# Patient Record
Sex: Male | Born: 1950 | Race: Black or African American | Hispanic: No | Smoking: Current every day smoker
Health system: Southern US, Community
[De-identification: ages and names within clinical notes are randomized; demographics above are authoritative.]

## PROBLEM LIST (undated history)

## (undated) DIAGNOSIS — I10 Essential (primary) hypertension: Secondary | ICD-10-CM

## (undated) DIAGNOSIS — R200 Anesthesia of skin: Secondary | ICD-10-CM

## (undated) DIAGNOSIS — M1990 Unspecified osteoarthritis, unspecified site: Secondary | ICD-10-CM

## (undated) DIAGNOSIS — C189 Malignant neoplasm of colon, unspecified: Secondary | ICD-10-CM

## (undated) DIAGNOSIS — J439 Emphysema, unspecified: Secondary | ICD-10-CM

## (undated) HISTORY — PX: COLONOSCOPY: SHX5424

---

## 2001-03-31 HISTORY — PX: AMPUTATION: SHX166

## 2010-06-18 ENCOUNTER — Other Ambulatory Visit: Payer: Self-pay | Admitting: Podiatry

## 2010-06-25 ENCOUNTER — Ambulatory Visit: Payer: Self-pay | Admitting: Anesthesiology

## 2010-06-26 ENCOUNTER — Ambulatory Visit: Payer: Self-pay | Admitting: Podiatry

## 2011-04-01 HISTORY — PX: COLON SURGERY: SHX602

## 2012-08-31 ENCOUNTER — Ambulatory Visit: Payer: Self-pay

## 2012-10-13 ENCOUNTER — Ambulatory Visit: Payer: Self-pay | Admitting: Gastroenterology

## 2012-10-14 LAB — PATHOLOGY REPORT

## 2012-10-28 ENCOUNTER — Ambulatory Visit: Payer: Self-pay | Admitting: Surgery

## 2012-10-28 LAB — CBC WITH DIFFERENTIAL/PLATELET
Basophil #: 0.1 10*3/uL (ref 0.0–0.1)
Eosinophil #: 0.2 10*3/uL (ref 0.0–0.7)
Lymphocyte %: 21.8 %
MCHC: 34.3 g/dL (ref 32.0–36.0)
Neutrophil %: 66.9 %
Platelet: 274 10*3/uL (ref 150–440)
RBC: 4.51 10*6/uL (ref 4.40–5.90)
RDW: 14.1 % (ref 11.5–14.5)
WBC: 9.8 10*3/uL (ref 3.8–10.6)

## 2012-10-28 LAB — BASIC METABOLIC PANEL
Anion Gap: 3 — ABNORMAL LOW (ref 7–16)
Calcium, Total: 9.4 mg/dL (ref 8.5–10.1)
Chloride: 103 mmol/L (ref 98–107)
Co2: 30 mmol/L (ref 21–32)
EGFR (African American): >60
EGFR (Non-African Amer.): >60
Sodium: 136 mmol/L (ref 136–145)

## 2012-11-05 ENCOUNTER — Inpatient Hospital Stay: Payer: Self-pay | Admitting: Surgery

## 2012-11-06 LAB — CBC WITH DIFFERENTIAL/PLATELET
Basophil #: 0.1 10*3/uL (ref 0.0–0.1)
Basophil %: 0.3 %
Eosinophil %: 0.1 %
HCT: 38.5 % — ABNORMAL LOW (ref 40.0–52.0)
HGB: 13.1 g/dL (ref 13.0–18.0)
Lymphocyte #: 1.6 10*3/uL (ref 1.0–3.6)
Lymphocyte %: 8.9 %
MCH: 30.4 pg (ref 26.0–34.0)
MCV: 90 fL (ref 80–100)
Monocyte #: 1.2 x10 3/mm — ABNORMAL HIGH (ref 0.2–1.0)
Monocyte %: 6.7 %
Neutrophil #: 14.9 10*3/uL — ABNORMAL HIGH (ref 1.4–6.5)
RBC: 4.3 10*6/uL — ABNORMAL LOW (ref 4.40–5.90)
RDW: 14 % (ref 11.5–14.5)
WBC: 17.7 10*3/uL — ABNORMAL HIGH (ref 3.8–10.6)

## 2012-11-06 LAB — BASIC METABOLIC PANEL
Anion Gap: 7 (ref 7–16)
BUN: 9 mg/dL (ref 7–18)
Calcium, Total: 9.1 mg/dL (ref 8.5–10.1)
Chloride: 99 mmol/L (ref 98–107)
Creatinine: 0.99 mg/dL (ref 0.60–1.30)
EGFR (African American): >60
Osmolality: 265 (ref 275–301)
Potassium: 4.4 mmol/L (ref 3.5–5.1)
Sodium: 132 mmol/L — ABNORMAL LOW (ref 136–145)

## 2012-11-16 ENCOUNTER — Ambulatory Visit: Payer: Self-pay | Admitting: Oncology

## 2012-11-29 ENCOUNTER — Ambulatory Visit: Payer: Self-pay | Admitting: Oncology

## 2012-12-28 ENCOUNTER — Ambulatory Visit: Payer: Self-pay | Admitting: Surgery

## 2012-12-29 ENCOUNTER — Ambulatory Visit: Payer: Self-pay | Admitting: Oncology

## 2012-12-31 LAB — CBC CANCER CENTER
Basophil #: 0.1 x10 3/mm (ref 0.0–0.1)
Basophil %: 1.1 %
Eosinophil %: 1.1 %
HGB: 13.7 g/dL (ref 13.0–18.0)
Lymphocyte #: 2 x10 3/mm (ref 1.0–3.6)
MCHC: 32.6 g/dL (ref 32.0–36.0)
Monocyte %: 8.6 %
Neutrophil %: 63.1 %
Platelet: 200 x10 3/mm (ref 150–440)
RBC: 4.56 10*6/uL (ref 4.40–5.90)
WBC: 7.7 x10 3/mm (ref 3.8–10.6)

## 2012-12-31 LAB — COMPREHENSIVE METABOLIC PANEL
Albumin: 3.6 g/dL (ref 3.4–5.0)
Alkaline Phosphatase: 108 U/L (ref 50–136)
Anion Gap: 8 (ref 7–16)
BUN: 8 mg/dL (ref 7–18)
Bilirubin,Total: 0.3 mg/dL (ref 0.2–1.0)
Calcium, Total: 9.2 mg/dL (ref 8.5–10.1)
Chloride: 103 mmol/L (ref 98–107)
Co2: 27 mmol/L (ref 21–32)
Creatinine: 1.01 mg/dL (ref 0.60–1.30)
EGFR (African American): >60
EGFR (Non-African Amer.): >60
Glucose: 117 mg/dL — ABNORMAL HIGH (ref 65–99)
Osmolality: 275 (ref 275–301)
Potassium: 3.5 mmol/L (ref 3.5–5.1)
SGOT(AST): 13 U/L — ABNORMAL LOW (ref 15–37)
SGPT (ALT): 16 U/L (ref 12–78)
Sodium: 138 mmol/L (ref 136–145)
Total Protein: 7.8 g/dL (ref 6.4–8.2)

## 2013-01-14 LAB — CBC CANCER CENTER
Basophil %: 1.4 %
Eosinophil #: 0.1 x10 3/mm (ref 0.0–0.7)
Eosinophil %: 1 %
HCT: 43.1 % (ref 40.0–52.0)
Lymphocyte #: 2.4 x10 3/mm (ref 1.0–3.6)
MCH: 30.5 pg (ref 26.0–34.0)
MCV: 93 fL (ref 80–100)
Monocyte #: 0.9 x10 3/mm (ref 0.2–1.0)
Monocyte %: 11.1 %
Neutrophil #: 4.4 x10 3/mm (ref 1.4–6.5)
Neutrophil %: 56.2 %
Platelet: 168 x10 3/mm (ref 150–440)
RBC: 4.65 10*6/uL (ref 4.40–5.90)
RDW: 14.4 % (ref 11.5–14.5)

## 2013-01-14 LAB — COMPREHENSIVE METABOLIC PANEL
Albumin: 3.6 g/dL (ref 3.4–5.0)
Anion Gap: 6 — ABNORMAL LOW (ref 7–16)
BUN: 6 mg/dL — ABNORMAL LOW (ref 7–18)
Bilirubin,Total: 0.2 mg/dL (ref 0.2–1.0)
Chloride: 104 mmol/L (ref 98–107)
Co2: 29 mmol/L (ref 21–32)
EGFR (African American): >60
EGFR (Non-African Amer.): >60
Glucose: 85 mg/dL (ref 65–99)
Osmolality: 274 (ref 275–301)
SGOT(AST): 15 U/L (ref 15–37)
SGPT (ALT): 20 U/L (ref 12–78)
Sodium: 139 mmol/L (ref 136–145)
Total Protein: 7.6 g/dL (ref 6.4–8.2)

## 2013-01-29 ENCOUNTER — Ambulatory Visit: Payer: Self-pay | Admitting: Oncology

## 2013-02-04 LAB — CBC CANCER CENTER
Basophil #: 0.2 x10 3/mm — ABNORMAL HIGH (ref 0.0–0.1)
Eosinophil #: 0.1 x10 3/mm (ref 0.0–0.7)
HCT: 45.9 % (ref 40.0–52.0)
Lymphocyte #: 2.7 x10 3/mm (ref 1.0–3.6)
Lymphocyte %: 30.7 %
MCH: 30.8 pg (ref 26.0–34.0)
MCHC: 33 g/dL (ref 32.0–36.0)
MCV: 93 fL (ref 80–100)
Monocyte #: 1.3 x10 3/mm — ABNORMAL HIGH (ref 0.2–1.0)
Monocyte %: 14.7 %
Neutrophil #: 4.5 x10 3/mm (ref 1.4–6.5)
Neutrophil %: 51 %
Platelet: 192 x10 3/mm (ref 150–440)
RBC: 4.92 10*6/uL (ref 4.40–5.90)
RDW: 15.2 % — ABNORMAL HIGH (ref 11.5–14.5)

## 2013-02-11 LAB — COMPREHENSIVE METABOLIC PANEL
Alkaline Phosphatase: 118 U/L (ref 50–136)
Anion Gap: 9 (ref 7–16)
BUN: 14 mg/dL (ref 7–18)
Bilirubin,Total: 0.2 mg/dL (ref 0.2–1.0)
Calcium, Total: 9.1 mg/dL (ref 8.5–10.1)
Chloride: 103 mmol/L (ref 98–107)
Co2: 27 mmol/L (ref 21–32)
EGFR (African American): >60
EGFR (Non-African Amer.): >60
Osmolality: 280 (ref 275–301)
SGOT(AST): 12 U/L — ABNORMAL LOW (ref 15–37)
Sodium: 139 mmol/L (ref 136–145)
Total Protein: 7.8 g/dL (ref 6.4–8.2)

## 2013-02-11 LAB — CBC CANCER CENTER
Basophil #: 0.2 x10 3/mm — ABNORMAL HIGH (ref 0.0–0.1)
Basophil %: 1.5 %
Eosinophil %: 0.5 %
HCT: 45 % (ref 40.0–52.0)
HGB: 14.8 g/dL (ref 13.0–18.0)
MCH: 31 pg (ref 26.0–34.0)
MCHC: 32.9 g/dL (ref 32.0–36.0)
Monocyte %: 7.9 %
Neutrophil #: 8.7 x10 3/mm — ABNORMAL HIGH (ref 1.4–6.5)
Neutrophil %: 67.4 %
Platelet: 170 x10 3/mm (ref 150–440)
RDW: 15.1 % — ABNORMAL HIGH (ref 11.5–14.5)

## 2013-02-28 ENCOUNTER — Ambulatory Visit: Payer: Self-pay | Admitting: Oncology

## 2013-03-04 LAB — CBC CANCER CENTER
Basophil #: 0.2 x10 3/mm — ABNORMAL HIGH (ref 0.0–0.1)
Eosinophil #: 0 x10 3/mm (ref 0.0–0.7)
HGB: 14.6 g/dL (ref 13.0–18.0)
Lymphocyte #: 2 x10 3/mm (ref 1.0–3.6)
Lymphocyte %: 37.9 %
MCH: 30.8 pg (ref 26.0–34.0)
MCV: 93 fL (ref 80–100)
Platelet: 153 x10 3/mm (ref 150–440)
WBC: 5.2 x10 3/mm (ref 3.8–10.6)

## 2013-03-04 LAB — COMPREHENSIVE METABOLIC PANEL
Albumin: 3.4 g/dL (ref 3.4–5.0)
Alkaline Phosphatase: 107 U/L
Anion Gap: 7 (ref 7–16)
Bilirubin,Total: 0.4 mg/dL (ref 0.2–1.0)
Calcium, Total: 8.8 mg/dL (ref 8.5–10.1)
Co2: 28 mmol/L (ref 21–32)
Creatinine: 0.88 mg/dL (ref 0.60–1.30)
EGFR (African American): >60
EGFR (Non-African Amer.): >60
Glucose: 106 mg/dL — ABNORMAL HIGH (ref 65–99)
Osmolality: 277 (ref 275–301)
Potassium: 3.2 mmol/L — ABNORMAL LOW (ref 3.5–5.1)
SGPT (ALT): 27 U/L (ref 12–78)
Sodium: 139 mmol/L (ref 136–145)

## 2013-03-11 LAB — CBC CANCER CENTER
Basophil #: 0.1 x10 3/mm (ref 0.0–0.1)
HGB: 15.1 g/dL (ref 13.0–18.0)
Lymphocyte #: 2.5 x10 3/mm (ref 1.0–3.6)
Lymphocyte %: 41 %
MCH: 31.3 pg (ref 26.0–34.0)
MCHC: 33.8 g/dL (ref 32.0–36.0)
MCV: 93 fL (ref 80–100)
Platelet: 126 x10 3/mm — ABNORMAL LOW (ref 150–440)
RBC: 4.81 10*6/uL (ref 4.40–5.90)
RDW: 15.4 % — ABNORMAL HIGH (ref 11.5–14.5)

## 2013-03-18 LAB — COMPREHENSIVE METABOLIC PANEL
Albumin: 3.5 g/dL (ref 3.4–5.0)
Anion Gap: 9 (ref 7–16)
BUN: 7 mg/dL (ref 7–18)
Calcium, Total: 8.8 mg/dL (ref 8.5–10.1)
Co2: 28 mmol/L (ref 21–32)
Creatinine: 1.01 mg/dL (ref 0.60–1.30)
EGFR (Non-African Amer.): >60
Osmolality: 277 (ref 275–301)
Potassium: 3.4 mmol/L — ABNORMAL LOW (ref 3.5–5.1)
SGOT(AST): 20 U/L (ref 15–37)
SGPT (ALT): 20 U/L (ref 12–78)
Sodium: 139 mmol/L (ref 136–145)
Total Protein: 7.6 g/dL (ref 6.4–8.2)

## 2013-03-18 LAB — CBC CANCER CENTER
Basophil %: 0.9 %
Eosinophil %: 0.7 %
HCT: 44.4 % (ref 40.0–52.0)
HGB: 14.8 g/dL (ref 13.0–18.0)
MCH: 31.1 pg (ref 26.0–34.0)
Monocyte %: 10.1 %
Neutrophil #: 2.4 x10 3/mm (ref 1.4–6.5)
Neutrophil %: 50.8 %
RBC: 4.75 10*6/uL (ref 4.40–5.90)
RDW: 15.9 % — ABNORMAL HIGH (ref 11.5–14.5)
WBC: 4.8 x10 3/mm (ref 3.8–10.6)

## 2013-03-18 LAB — MAGNESIUM: Magnesium: 1.9 mg/dL

## 2013-03-31 ENCOUNTER — Ambulatory Visit: Payer: Self-pay | Admitting: Oncology

## 2013-04-01 LAB — CBC CANCER CENTER
BASOS ABS: 0.1 x10 3/mm (ref 0.0–0.1)
Basophil %: 1.1 %
Eosinophil #: 0 x10 3/mm (ref 0.0–0.7)
Eosinophil %: 0.3 %
HCT: 44.8 % (ref 40.0–52.0)
HGB: 14.9 g/dL (ref 13.0–18.0)
Lymphocyte #: 2.4 x10 3/mm (ref 1.0–3.6)
Lymphocyte %: 34.6 %
MCH: 31.2 pg (ref 26.0–34.0)
MCHC: 33.3 g/dL (ref 32.0–36.0)
MCV: 94 fL (ref 80–100)
MONO ABS: 0.7 x10 3/mm (ref 0.2–1.0)
MONOS PCT: 10.6 %
NEUTROS PCT: 53.4 %
Neutrophil #: 3.6 x10 3/mm (ref 1.4–6.5)
Platelet: 73 x10 3/mm — ABNORMAL LOW (ref 150–440)
RBC: 4.78 10*6/uL (ref 4.40–5.90)
RDW: 16.1 % — AB (ref 11.5–14.5)
WBC: 6.8 x10 3/mm (ref 3.8–10.6)

## 2013-04-01 LAB — COMPREHENSIVE METABOLIC PANEL
ALBUMIN: 3.4 g/dL (ref 3.4–5.0)
AST: 41 U/L — AB (ref 15–37)
Alkaline Phosphatase: 112 U/L
Anion Gap: 10 (ref 7–16)
BILIRUBIN TOTAL: 0.4 mg/dL (ref 0.2–1.0)
BUN: 14 mg/dL (ref 7–18)
CALCIUM: 9.2 mg/dL (ref 8.5–10.1)
CHLORIDE: 103 mmol/L (ref 98–107)
CREATININE: 1.1 mg/dL (ref 0.60–1.30)
Co2: 26 mmol/L (ref 21–32)
Glucose: 153 mg/dL — ABNORMAL HIGH (ref 65–99)
Osmolality: 281 (ref 275–301)
Potassium: 3.3 mmol/L — ABNORMAL LOW (ref 3.5–5.1)
SGPT (ALT): 46 U/L (ref 12–78)
Sodium: 139 mmol/L (ref 136–145)
Total Protein: 7.4 g/dL (ref 6.4–8.2)

## 2013-04-15 LAB — COMPREHENSIVE METABOLIC PANEL
ALBUMIN: 3.5 g/dL (ref 3.4–5.0)
ALK PHOS: 114 U/L
ANION GAP: 11 (ref 7–16)
BUN: 12 mg/dL (ref 7–18)
Bilirubin,Total: 0.4 mg/dL (ref 0.2–1.0)
Calcium, Total: 9.3 mg/dL (ref 8.5–10.1)
Chloride: 103 mmol/L (ref 98–107)
Co2: 27 mmol/L (ref 21–32)
Creatinine: 1.04 mg/dL (ref 0.60–1.30)
Glucose: 156 mg/dL — ABNORMAL HIGH (ref 65–99)
Osmolality: 284 (ref 275–301)
POTASSIUM: 3.1 mmol/L — AB (ref 3.5–5.1)
SGOT(AST): 32 U/L (ref 15–37)
SGPT (ALT): 38 U/L (ref 12–78)
Sodium: 141 mmol/L (ref 136–145)
Total Protein: 7.7 g/dL (ref 6.4–8.2)

## 2013-04-15 LAB — CBC CANCER CENTER
Basophil #: 0.1 x10 3/mm (ref 0.0–0.1)
Basophil %: 1 %
EOS PCT: 0.3 %
Eosinophil #: 0 x10 3/mm (ref 0.0–0.7)
HCT: 44 % (ref 40.0–52.0)
HGB: 14.7 g/dL (ref 13.0–18.0)
LYMPHS ABS: 2.1 x10 3/mm (ref 1.0–3.6)
LYMPHS PCT: 38.8 %
MCH: 31.6 pg (ref 26.0–34.0)
MCHC: 33.3 g/dL (ref 32.0–36.0)
MCV: 95 fL (ref 80–100)
MONO ABS: 0.6 x10 3/mm (ref 0.2–1.0)
Monocyte %: 11.7 %
Neutrophil #: 2.7 x10 3/mm (ref 1.4–6.5)
Neutrophil %: 48.2 %
Platelet: 72 x10 3/mm — ABNORMAL LOW (ref 150–440)
RBC: 4.63 10*6/uL (ref 4.40–5.90)
RDW: 16.6 % — AB (ref 11.5–14.5)
WBC: 5.5 x10 3/mm (ref 3.8–10.6)

## 2013-05-01 ENCOUNTER — Ambulatory Visit: Payer: Self-pay | Admitting: Oncology

## 2013-05-06 LAB — COMPREHENSIVE METABOLIC PANEL
ALK PHOS: 127 U/L — AB
AST: 28 U/L (ref 15–37)
Albumin: 3.5 g/dL (ref 3.4–5.0)
Anion Gap: 10 (ref 7–16)
BUN: 9 mg/dL (ref 7–18)
Bilirubin,Total: 0.4 mg/dL (ref 0.2–1.0)
Calcium, Total: 9 mg/dL (ref 8.5–10.1)
Chloride: 102 mmol/L (ref 98–107)
Co2: 28 mmol/L (ref 21–32)
Creatinine: 0.93 mg/dL (ref 0.60–1.30)
GLUCOSE: 171 mg/dL — AB (ref 65–99)
Osmolality: 282 (ref 275–301)
POTASSIUM: 2.9 mmol/L — AB (ref 3.5–5.1)
SGPT (ALT): 33 U/L (ref 12–78)
Sodium: 140 mmol/L (ref 136–145)
Total Protein: 7.6 g/dL (ref 6.4–8.2)

## 2013-05-06 LAB — CBC CANCER CENTER
BASOS ABS: 0.1 x10 3/mm (ref 0.0–0.1)
Basophil %: 1.4 %
EOS PCT: 0.6 %
Eosinophil #: 0 x10 3/mm (ref 0.0–0.7)
HCT: 46.3 % (ref 40.0–52.0)
HGB: 15.2 g/dL (ref 13.0–18.0)
Lymphocyte #: 2.4 x10 3/mm (ref 1.0–3.6)
Lymphocyte %: 48.7 %
MCH: 31.9 pg (ref 26.0–34.0)
MCHC: 32.7 g/dL (ref 32.0–36.0)
MCV: 98 fL (ref 80–100)
MONOS PCT: 20.1 %
Monocyte #: 1 x10 3/mm (ref 0.2–1.0)
NEUTROS ABS: 1.4 x10 3/mm (ref 1.4–6.5)
NEUTROS PCT: 29.2 %
Platelet: 83 x10 3/mm — ABNORMAL LOW (ref 150–440)
RBC: 4.75 10*6/uL (ref 4.40–5.90)
RDW: 16.8 % — ABNORMAL HIGH (ref 11.5–14.5)
WBC: 4.9 x10 3/mm (ref 3.8–10.6)

## 2013-05-20 LAB — HEPATIC FUNCTION PANEL A (ARMC)
ALBUMIN: 3.6 g/dL (ref 3.4–5.0)
Alkaline Phosphatase: 123 U/L — ABNORMAL HIGH
Bilirubin, Direct: 0.1 mg/dL (ref 0.00–0.20)
Bilirubin,Total: 0.4 mg/dL (ref 0.2–1.0)
SGOT(AST): 31 U/L (ref 15–37)
SGPT (ALT): 30 U/L (ref 12–78)
Total Protein: 8.3 g/dL — ABNORMAL HIGH (ref 6.4–8.2)

## 2013-05-27 LAB — CBC CANCER CENTER
Basophil #: 0.1 x10 3/mm (ref 0.0–0.1)
Basophil %: 1.3 %
EOS PCT: 0.5 %
Eosinophil #: 0 x10 3/mm (ref 0.0–0.7)
HCT: 43.4 % (ref 40.0–52.0)
HGB: 14.2 g/dL (ref 13.0–18.0)
Lymphocyte #: 2.9 x10 3/mm (ref 1.0–3.6)
Lymphocyte %: 32.9 %
MCH: 31.9 pg (ref 26.0–34.0)
MCHC: 32.8 g/dL (ref 32.0–36.0)
MCV: 97 fL (ref 80–100)
MONO ABS: 1.3 x10 3/mm — AB (ref 0.2–1.0)
Monocyte %: 14.6 %
Neutrophil #: 4.4 x10 3/mm (ref 1.4–6.5)
Neutrophil %: 50.7 %
PLATELETS: 171 x10 3/mm (ref 150–440)
RBC: 4.47 10*6/uL (ref 4.40–5.90)
RDW: 16 % — AB (ref 11.5–14.5)
WBC: 8.8 x10 3/mm (ref 3.8–10.6)

## 2013-05-27 LAB — COMPREHENSIVE METABOLIC PANEL
ALBUMIN: 3.2 g/dL — AB (ref 3.4–5.0)
ALK PHOS: 143 U/L — AB
ANION GAP: 10 (ref 7–16)
BUN: 9 mg/dL (ref 7–18)
Bilirubin,Total: 0.3 mg/dL (ref 0.2–1.0)
CALCIUM: 8.8 mg/dL (ref 8.5–10.1)
CHLORIDE: 102 mmol/L (ref 98–107)
CO2: 27 mmol/L (ref 21–32)
CREATININE: 1.13 mg/dL (ref 0.60–1.30)
EGFR (Non-African Amer.): >60
Glucose: 174 mg/dL — ABNORMAL HIGH (ref 65–99)
Osmolality: 280 (ref 275–301)
Potassium: 3.2 mmol/L — ABNORMAL LOW (ref 3.5–5.1)
SGOT(AST): 26 U/L (ref 15–37)
SGPT (ALT): 24 U/L (ref 12–78)
SODIUM: 139 mmol/L (ref 136–145)
Total Protein: 8 g/dL (ref 6.4–8.2)

## 2013-05-29 ENCOUNTER — Ambulatory Visit: Payer: Self-pay | Admitting: Oncology

## 2013-06-17 LAB — COMPREHENSIVE METABOLIC PANEL
Albumin: 3.3 g/dL — ABNORMAL LOW (ref 3.4–5.0)
Alkaline Phosphatase: 139 U/L — ABNORMAL HIGH
Anion Gap: 8 (ref 7–16)
BUN: 9 mg/dL (ref 7–18)
Bilirubin,Total: 0.4 mg/dL (ref 0.2–1.0)
CHLORIDE: 102 mmol/L (ref 98–107)
CO2: 28 mmol/L (ref 21–32)
CREATININE: 1.11 mg/dL (ref 0.60–1.30)
Calcium, Total: 8.9 mg/dL (ref 8.5–10.1)
EGFR (African American): >60
EGFR (Non-African Amer.): >60
Glucose: 244 mg/dL — ABNORMAL HIGH (ref 65–99)
OSMOLALITY: 282 (ref 275–301)
POTASSIUM: 2.9 mmol/L — AB (ref 3.5–5.1)
SGOT(AST): 27 U/L (ref 15–37)
SGPT (ALT): 26 U/L (ref 12–78)
SODIUM: 138 mmol/L (ref 136–145)
TOTAL PROTEIN: 7.6 g/dL (ref 6.4–8.2)

## 2013-06-17 LAB — CBC CANCER CENTER
BASOS PCT: 1.5 %
Basophil #: 0.1 x10 3/mm (ref 0.0–0.1)
EOS ABS: 0 x10 3/mm (ref 0.0–0.7)
Eosinophil %: 1 %
HCT: 44.7 % (ref 40.0–52.0)
HGB: 14.6 g/dL (ref 13.0–18.0)
LYMPHS ABS: 2.4 x10 3/mm (ref 1.0–3.6)
Lymphocyte %: 51.5 %
MCH: 31.7 pg (ref 26.0–34.0)
MCHC: 32.6 g/dL (ref 32.0–36.0)
MCV: 97 fL (ref 80–100)
MONO ABS: 0.9 x10 3/mm (ref 0.2–1.0)
Monocyte %: 19.1 %
NEUTROS ABS: 1.3 x10 3/mm — AB (ref 1.4–6.5)
Neutrophil %: 26.9 %
Platelet: 140 x10 3/mm — ABNORMAL LOW (ref 150–440)
RBC: 4.59 10*6/uL (ref 4.40–5.90)
RDW: 15.6 % — AB (ref 11.5–14.5)
WBC: 4.7 x10 3/mm (ref 3.8–10.6)

## 2013-06-20 LAB — CEA: CEA: 13.4 ng/mL — ABNORMAL HIGH (ref 0.0–4.7)

## 2013-06-24 LAB — COMPREHENSIVE METABOLIC PANEL
ALBUMIN: 4.1 g/dL (ref 3.4–5.0)
ALK PHOS: 146 U/L — AB
ALT: 48 U/L (ref 12–78)
Anion Gap: 11 (ref 7–16)
BUN: 40 mg/dL — ABNORMAL HIGH (ref 7–18)
Bilirubin,Total: 0.8 mg/dL (ref 0.2–1.0)
CHLORIDE: 94 mmol/L — AB (ref 98–107)
Calcium, Total: 9 mg/dL (ref 8.5–10.1)
Co2: 30 mmol/L (ref 21–32)
Creatinine: 1.58 mg/dL — ABNORMAL HIGH (ref 0.60–1.30)
GFR CALC AF AMER: 54 — AB
GFR CALC NON AF AMER: 47 — AB
Glucose: 156 mg/dL — ABNORMAL HIGH (ref 65–99)
Osmolality: 283 (ref 275–301)
Potassium: 2.9 mmol/L — ABNORMAL LOW (ref 3.5–5.1)
SGOT(AST): 43 U/L — ABNORMAL HIGH (ref 15–37)
SODIUM: 135 mmol/L — AB (ref 136–145)
TOTAL PROTEIN: 8.9 g/dL — AB (ref 6.4–8.2)

## 2013-06-24 LAB — AMYLASE: AMYLASE: 113 U/L (ref 25–115)

## 2013-06-24 LAB — CBC CANCER CENTER
BASOS ABS: 0.1 x10 3/mm (ref 0.0–0.1)
BASOS PCT: 0.7 %
Eosinophil #: 0 x10 3/mm (ref 0.0–0.7)
Eosinophil %: 0.1 %
HCT: 48.9 % (ref 40.0–52.0)
HGB: 16.4 g/dL (ref 13.0–18.0)
LYMPHS ABS: 2.3 x10 3/mm (ref 1.0–3.6)
Lymphocyte %: 26.9 %
MCH: 32.3 pg (ref 26.0–34.0)
MCHC: 33.6 g/dL (ref 32.0–36.0)
MCV: 96 fL (ref 80–100)
Monocyte #: 0.4 x10 3/mm (ref 0.2–1.0)
Monocyte %: 4.2 %
Neutrophil #: 5.9 x10 3/mm (ref 1.4–6.5)
Neutrophil %: 68.1 %
Platelet: 95 x10 3/mm — ABNORMAL LOW (ref 150–440)
RBC: 5.07 10*6/uL (ref 4.40–5.90)
RDW: 14.9 % — ABNORMAL HIGH (ref 11.5–14.5)
WBC: 8.6 x10 3/mm (ref 3.8–10.6)

## 2013-06-24 LAB — LIPASE, BLOOD: Lipase: 196 U/L (ref 73–393)

## 2013-06-27 ENCOUNTER — Inpatient Hospital Stay: Payer: Self-pay | Admitting: Internal Medicine

## 2013-06-27 LAB — COMPREHENSIVE METABOLIC PANEL
ALBUMIN: 4.3 g/dL (ref 3.4–5.0)
ALK PHOS: 157 U/L — AB
ANION GAP: 17 — AB (ref 7–16)
AST: 85 U/L — AB (ref 15–37)
BILIRUBIN TOTAL: 1 mg/dL (ref 0.2–1.0)
BUN: 103 mg/dL — ABNORMAL HIGH (ref 7–18)
CO2: 29 mmol/L (ref 21–32)
CREATININE: 5.09 mg/dL — AB (ref 0.60–1.30)
Calcium, Total: 8.5 mg/dL (ref 8.5–10.1)
Chloride: 79 mmol/L — ABNORMAL LOW (ref 98–107)
EGFR (African American): 13 — ABNORMAL LOW
EGFR (Non-African Amer.): 11 — ABNORMAL LOW
Glucose: 164 mg/dL — ABNORMAL HIGH (ref 65–99)
OSMOLALITY: 287 (ref 275–301)
Potassium: 3.1 mmol/L — ABNORMAL LOW (ref 3.5–5.1)
SGPT (ALT): 122 U/L — ABNORMAL HIGH (ref 12–78)
Sodium: 125 mmol/L — ABNORMAL LOW (ref 136–145)
TOTAL PROTEIN: 10 g/dL — AB (ref 6.4–8.2)

## 2013-06-27 LAB — CBC
HCT: 54.3 % — ABNORMAL HIGH (ref 40.0–52.0)
HGB: 17.8 g/dL (ref 13.0–18.0)
MCH: 31.2 pg (ref 26.0–34.0)
MCHC: 32.7 g/dL (ref 32.0–36.0)
MCV: 95 fL (ref 80–100)
Platelet: 97 10*3/uL — ABNORMAL LOW (ref 150–440)
RBC: 5.69 10*6/uL (ref 4.40–5.90)
RDW: 14.9 % — ABNORMAL HIGH (ref 11.5–14.5)
WBC: 14.5 10*3/uL — ABNORMAL HIGH (ref 3.8–10.6)

## 2013-06-27 LAB — MAGNESIUM: MAGNESIUM: 3.4 mg/dL — AB

## 2013-06-27 LAB — URINALYSIS, COMPLETE
Bacteria: NONE SEEN
Bilirubin,UR: NEGATIVE
Glucose,UR: 50 mg/dL (ref 0–75)
Ketone: NEGATIVE
LEUKOCYTE ESTERASE: NEGATIVE
NITRITE: NEGATIVE
Ph: 5 (ref 4.5–8.0)
Protein: 30
RBC,UR: <1 /HPF (ref 0–5)
SPECIFIC GRAVITY: 1.017 (ref 1.003–1.030)
Squamous Epithelial: <1
WBC UR: 2 /HPF (ref 0–5)

## 2013-06-27 LAB — LIPASE, BLOOD: Lipase: 673 U/L — ABNORMAL HIGH (ref 73–393)

## 2013-06-27 LAB — URIC ACID: URIC ACID: 17.1 mg/dL — AB (ref 3.5–7.2)

## 2013-06-27 LAB — TROPONIN I: Troponin-I: 0.04 ng/mL

## 2013-06-27 LAB — CK: CK, Total: 189 U/L

## 2013-06-27 LAB — PHOSPHORUS: PHOSPHORUS: 10.3 mg/dL — AB (ref 2.5–4.9)

## 2013-06-28 LAB — BASIC METABOLIC PANEL
Anion Gap: 8 (ref 7–16)
BUN: 94 mg/dL — ABNORMAL HIGH (ref 7–18)
CALCIUM: 8.2 mg/dL — AB (ref 8.5–10.1)
CO2: 33 mmol/L — AB (ref 21–32)
Chloride: 89 mmol/L — ABNORMAL LOW (ref 98–107)
Creatinine: 2.99 mg/dL — ABNORMAL HIGH (ref 0.60–1.30)
EGFR (Non-African Amer.): 22 — ABNORMAL LOW
GFR CALC AF AMER: 25 — AB
Glucose: 138 mg/dL — ABNORMAL HIGH (ref 65–99)
Osmolality: 292 (ref 275–301)
Potassium: 3 mmol/L — ABNORMAL LOW (ref 3.5–5.1)
Sodium: 130 mmol/L — ABNORMAL LOW (ref 136–145)

## 2013-06-28 LAB — CBC WITH DIFFERENTIAL/PLATELET
Basophil #: 0 10*3/uL (ref 0.0–0.1)
Basophil %: 0.3 %
EOS ABS: 0 10*3/uL (ref 0.0–0.7)
Eosinophil %: 0 %
HCT: 47.4 % (ref 40.0–52.0)
HGB: 15.9 g/dL (ref 13.0–18.0)
Lymphocyte #: 0.9 10*3/uL — ABNORMAL LOW (ref 1.0–3.6)
Lymphocyte %: 6.5 %
MCH: 32 pg (ref 26.0–34.0)
MCHC: 33.5 g/dL (ref 32.0–36.0)
MCV: 95 fL (ref 80–100)
Monocyte #: 0.7 x10 3/mm (ref 0.2–1.0)
Monocyte %: 5.1 %
Neutrophil #: 12.9 10*3/uL — ABNORMAL HIGH (ref 1.4–6.5)
Neutrophil %: 88.1 %
RBC: 4.97 10*6/uL (ref 4.40–5.90)
RDW: 14.8 % — ABNORMAL HIGH (ref 11.5–14.5)
WBC: 14.6 10*3/uL — ABNORMAL HIGH (ref 3.8–10.6)

## 2013-06-28 LAB — URINE CULTURE

## 2013-06-28 LAB — URIC ACID: URIC ACID: 14.1 mg/dL — AB (ref 3.5–7.2)

## 2013-06-28 LAB — PHOSPHORUS: Phosphorus: 4.7 mg/dL (ref 2.5–4.9)

## 2013-06-28 LAB — MAGNESIUM: Magnesium: 3.2 mg/dL — ABNORMAL HIGH

## 2013-06-28 LAB — LIPID PANEL
CHOLESTEROL: 178 mg/dL (ref 0–200)
HDL Cholesterol: 54 mg/dL (ref 40–60)
Ldl Cholesterol, Calc: 100 mg/dL (ref 0–100)
Triglycerides: 122 mg/dL (ref 0–200)
VLDL Cholesterol, Calc: 24 mg/dL (ref 5–40)

## 2013-06-28 LAB — TSH: Thyroid Stimulating Horm: 0.206 u[IU]/mL — ABNORMAL LOW

## 2013-06-28 LAB — LIPASE, BLOOD: LIPASE: 1034 U/L — AB (ref 73–393)

## 2013-06-28 LAB — HEMOGLOBIN A1C: HEMOGLOBIN A1C: 6.6 % — AB (ref 4.2–6.3)

## 2013-06-29 ENCOUNTER — Ambulatory Visit: Payer: Self-pay | Admitting: Oncology

## 2013-06-29 LAB — BASIC METABOLIC PANEL
ANION GAP: 7 (ref 7–16)
BUN: 88 mg/dL — AB (ref 7–18)
CALCIUM: 8.6 mg/dL (ref 8.5–10.1)
Chloride: 97 mmol/L — ABNORMAL LOW (ref 98–107)
Co2: 32 mmol/L (ref 21–32)
Creatinine: 2.16 mg/dL — ABNORMAL HIGH (ref 0.60–1.30)
EGFR (African American): 37 — ABNORMAL LOW
EGFR (Non-African Amer.): 32 — ABNORMAL LOW
GLUCOSE: 125 mg/dL — AB (ref 65–99)
OSMOLALITY: 300 (ref 275–301)
Potassium: 3.6 mmol/L (ref 3.5–5.1)
SODIUM: 136 mmol/L (ref 136–145)

## 2013-06-29 LAB — CBC WITH DIFFERENTIAL/PLATELET
Basophil #: 0 10*3/uL (ref 0.0–0.1)
Basophil %: 0.3 %
EOS ABS: 0 10*3/uL (ref 0.0–0.7)
EOS PCT: 0 %
HCT: 48.9 % (ref 40.0–52.0)
HGB: 15.7 g/dL (ref 13.0–18.0)
LYMPHS ABS: 0.9 10*3/uL — AB (ref 1.0–3.6)
Lymphocyte %: 8.3 %
MCH: 31.1 pg (ref 26.0–34.0)
MCHC: 32.1 g/dL (ref 32.0–36.0)
MCV: 97 fL (ref 80–100)
MONOS PCT: 7.9 %
Monocyte #: 0.9 x10 3/mm (ref 0.2–1.0)
NEUTROS PCT: 83.5 %
Neutrophil #: 9.4 10*3/uL — ABNORMAL HIGH (ref 1.4–6.5)
RBC: 5.04 10*6/uL (ref 4.40–5.90)
RDW: 15.3 % — AB (ref 11.5–14.5)
WBC: 11.3 10*3/uL — ABNORMAL HIGH (ref 3.8–10.6)

## 2013-06-29 LAB — URIC ACID: Uric Acid: 13.2 mg/dL — ABNORMAL HIGH (ref 3.5–7.2)

## 2013-06-30 LAB — CBC WITH DIFFERENTIAL/PLATELET
Basophil #: 0 10*3/uL (ref 0.0–0.1)
Basophil %: 0.4 %
EOS PCT: 0 %
Eosinophil #: 0 10*3/uL (ref 0.0–0.7)
HCT: 47.3 % (ref 40.0–52.0)
HGB: 15.2 g/dL (ref 13.0–18.0)
LYMPHS ABS: 0.6 10*3/uL — AB (ref 1.0–3.6)
Lymphocyte %: 18.5 %
MCH: 31.6 pg (ref 26.0–34.0)
MCHC: 32.1 g/dL (ref 32.0–36.0)
MCV: 98 fL (ref 80–100)
MONO ABS: 0.9 x10 3/mm (ref 0.2–1.0)
Monocyte %: 25.3 %
Neutrophil #: 1.9 10*3/uL (ref 1.4–6.5)
Neutrophil %: 55.8 %
PLATELETS: 73 10*3/uL — AB (ref 150–440)
RBC: 4.81 10*6/uL (ref 4.40–5.90)
RDW: 15.2 % — AB (ref 11.5–14.5)
WBC: 3.5 10*3/uL — ABNORMAL LOW (ref 3.8–10.6)

## 2013-06-30 LAB — BASIC METABOLIC PANEL
Anion Gap: 7 (ref 7–16)
BUN: 61 mg/dL — ABNORMAL HIGH (ref 7–18)
CHLORIDE: 108 mmol/L — AB (ref 98–107)
CREATININE: 1.58 mg/dL — AB (ref 0.60–1.30)
Calcium, Total: 8.6 mg/dL (ref 8.5–10.1)
Co2: 30 mmol/L (ref 21–32)
EGFR (African American): 54 — ABNORMAL LOW
EGFR (Non-African Amer.): 47 — ABNORMAL LOW
Glucose: 138 mg/dL — ABNORMAL HIGH (ref 65–99)
Osmolality: 308 (ref 275–301)
POTASSIUM: 4 mmol/L (ref 3.5–5.1)
SODIUM: 145 mmol/L (ref 136–145)

## 2013-06-30 LAB — URIC ACID: Uric Acid: 8.9 mg/dL — ABNORMAL HIGH (ref 3.5–7.2)

## 2013-07-01 LAB — CBC WITH DIFFERENTIAL/PLATELET
BANDS NEUTROPHIL: 6 %
HCT: 41.5 % (ref 40.0–52.0)
HGB: 13.3 g/dL (ref 13.0–18.0)
Lymphocytes: 45 %
MCH: 31.7 pg (ref 26.0–34.0)
MCHC: 31.9 g/dL — ABNORMAL LOW (ref 32.0–36.0)
MCV: 99 fL (ref 80–100)
MONOS PCT: 19 %
Metamyelocyte: 1 %
NRBC/100 WBC: 1 /
RBC: 4.18 10*6/uL — ABNORMAL LOW (ref 4.40–5.90)
RDW: 15 % — ABNORMAL HIGH (ref 11.5–14.5)
SEGMENTED NEUTROPHILS: 29 %
WBC: 4.3 10*3/uL (ref 3.8–10.6)

## 2013-07-01 LAB — BASIC METABOLIC PANEL
ANION GAP: 5 — AB (ref 7–16)
BUN: 51 mg/dL — ABNORMAL HIGH (ref 7–18)
Calcium, Total: 7.5 mg/dL — ABNORMAL LOW (ref 8.5–10.1)
Chloride: 111 mmol/L — ABNORMAL HIGH (ref 98–107)
Co2: 28 mmol/L (ref 21–32)
Creatinine: 2.1 mg/dL — ABNORMAL HIGH (ref 0.60–1.30)
EGFR (Non-African Amer.): 33 — ABNORMAL LOW
GFR CALC AF AMER: 38 — AB
Glucose: 176 mg/dL — ABNORMAL HIGH (ref 65–99)
Osmolality: 305 (ref 275–301)
Potassium: 4.5 mmol/L (ref 3.5–5.1)
SODIUM: 144 mmol/L (ref 136–145)

## 2013-07-02 LAB — CREATININE, SERUM
Creatinine: 1.66 mg/dL — ABNORMAL HIGH (ref 0.60–1.30)
EGFR (African American): 51 — ABNORMAL LOW
GFR CALC NON AF AMER: 44 — AB

## 2013-07-02 LAB — VANCOMYCIN, TROUGH: Vancomycin, Trough: 14 ug/mL (ref 10–20)

## 2013-07-03 LAB — CBC WITH DIFFERENTIAL/PLATELET
Bands: 3 %
HCT: 34.7 % — ABNORMAL LOW (ref 40.0–52.0)
HGB: 11.3 g/dL — AB (ref 13.0–18.0)
LYMPHS PCT: 16 %
MCH: 32.1 pg (ref 26.0–34.0)
MCHC: 32.5 g/dL (ref 32.0–36.0)
MCV: 99 fL (ref 80–100)
Metamyelocyte: 1 %
Monocytes: 8 %
Myelocyte: 1 %
NRBC/100 WBC: 1 /
PLATELETS: 71 10*3/uL — AB (ref 150–440)
RBC: 3.52 10*6/uL — ABNORMAL LOW (ref 4.40–5.90)
RDW: 15 % — AB (ref 11.5–14.5)
SEGMENTED NEUTROPHILS: 71 %
WBC: 12.1 10*3/uL — ABNORMAL HIGH (ref 3.8–10.6)

## 2013-07-03 LAB — BASIC METABOLIC PANEL
Anion Gap: 6 — ABNORMAL LOW (ref 7–16)
BUN: 14 mg/dL (ref 7–18)
CO2: 27 mmol/L (ref 21–32)
Calcium, Total: 7.2 mg/dL — ABNORMAL LOW (ref 8.5–10.1)
Chloride: 112 mmol/L — ABNORMAL HIGH (ref 98–107)
Creatinine: 1.38 mg/dL — ABNORMAL HIGH (ref 0.60–1.30)
EGFR (Non-African Amer.): 55 — ABNORMAL LOW
Glucose: 112 mg/dL — ABNORMAL HIGH (ref 65–99)
OSMOLALITY: 290 (ref 275–301)
POTASSIUM: 2.8 mmol/L — AB (ref 3.5–5.1)
SODIUM: 145 mmol/L (ref 136–145)

## 2013-07-03 LAB — VANCOMYCIN, TROUGH: VANCOMYCIN, TROUGH: 11 ug/mL (ref 10–20)

## 2013-07-04 LAB — CBC WITH DIFFERENTIAL/PLATELET
BASOS ABS: 0.1 10*3/uL (ref 0.0–0.1)
BASOS PCT: 0.5 %
Eosinophil #: 0 10*3/uL (ref 0.0–0.7)
Eosinophil %: 0.3 %
HCT: 34.9 % — ABNORMAL LOW (ref 40.0–52.0)
HGB: 11.3 g/dL — AB (ref 13.0–18.0)
LYMPHS ABS: 1.6 10*3/uL (ref 1.0–3.6)
LYMPHS PCT: 10.4 %
MCH: 31.5 pg (ref 26.0–34.0)
MCHC: 32.4 g/dL (ref 32.0–36.0)
MCV: 97 fL (ref 80–100)
MONO ABS: 1.4 x10 3/mm — AB (ref 0.2–1.0)
Monocyte %: 9.6 %
Neutrophil #: 11.8 10*3/uL — ABNORMAL HIGH (ref 1.4–6.5)
Neutrophil %: 79.2 %
Platelet: 87 10*3/uL — ABNORMAL LOW (ref 150–440)
RBC: 3.59 10*6/uL — ABNORMAL LOW (ref 4.40–5.90)
RDW: 14.8 % — ABNORMAL HIGH (ref 11.5–14.5)
WBC: 14.9 10*3/uL — ABNORMAL HIGH (ref 3.8–10.6)

## 2013-07-04 LAB — BASIC METABOLIC PANEL
Anion Gap: 8 (ref 7–16)
BUN: 8 mg/dL (ref 7–18)
Calcium, Total: 6.8 mg/dL — CL (ref 8.5–10.1)
Chloride: 112 mmol/L — ABNORMAL HIGH (ref 98–107)
Co2: 23 mmol/L (ref 21–32)
Creatinine: 1.25 mg/dL (ref 0.60–1.30)
EGFR (African American): >60
Glucose: 116 mg/dL — ABNORMAL HIGH (ref 65–99)
OSMOLALITY: 284 (ref 275–301)
Potassium: 2.6 mmol/L — ABNORMAL LOW (ref 3.5–5.1)
Sodium: 143 mmol/L (ref 136–145)

## 2013-07-04 LAB — CLOSTRIDIUM DIFFICILE(ARMC)

## 2013-07-05 LAB — COMPREHENSIVE METABOLIC PANEL
Albumin: 2 g/dL — ABNORMAL LOW (ref 3.4–5.0)
Alkaline Phosphatase: 103 U/L
Anion Gap: 6 — ABNORMAL LOW (ref 7–16)
BUN: 6 mg/dL — AB (ref 7–18)
Bilirubin,Total: 0.6 mg/dL (ref 0.2–1.0)
CALCIUM: 6.8 mg/dL — AB (ref 8.5–10.1)
CHLORIDE: 112 mmol/L — AB (ref 98–107)
CO2: 24 mmol/L (ref 21–32)
CREATININE: 1.27 mg/dL (ref 0.60–1.30)
EGFR (African American): >60
EGFR (Non-African Amer.): >60
Glucose: 113 mg/dL — ABNORMAL HIGH (ref 65–99)
Osmolality: 282 (ref 275–301)
Potassium: 2.9 mmol/L — ABNORMAL LOW (ref 3.5–5.1)
SGOT(AST): 70 U/L — ABNORMAL HIGH (ref 15–37)
SGPT (ALT): 48 U/L (ref 12–78)
Sodium: 142 mmol/L (ref 136–145)
Total Protein: 6.3 g/dL — ABNORMAL LOW (ref 6.4–8.2)

## 2013-07-05 LAB — CBC WITH DIFFERENTIAL/PLATELET
Basophil #: 0.1 10*3/uL (ref 0.0–0.1)
Basophil %: 0.4 %
EOS PCT: 0.4 %
Eosinophil #: 0.1 10*3/uL (ref 0.0–0.7)
HCT: 34 % — ABNORMAL LOW (ref 40.0–52.0)
HGB: 10.8 g/dL — ABNORMAL LOW (ref 13.0–18.0)
LYMPHS ABS: 2.1 10*3/uL (ref 1.0–3.6)
LYMPHS PCT: 12.5 %
MCH: 30.9 pg (ref 26.0–34.0)
MCHC: 31.8 g/dL — ABNORMAL LOW (ref 32.0–36.0)
MCV: 97 fL (ref 80–100)
MONO ABS: 1.3 x10 3/mm — AB (ref 0.2–1.0)
Monocyte %: 8.1 %
Neutrophil #: 12.9 10*3/uL — ABNORMAL HIGH (ref 1.4–6.5)
Neutrophil %: 78.6 %
Platelet: 98 10*3/uL — ABNORMAL LOW (ref 150–440)
RBC: 3.49 10*6/uL — ABNORMAL LOW (ref 4.40–5.90)
RDW: 15 % — AB (ref 11.5–14.5)
WBC: 16.4 10*3/uL — ABNORMAL HIGH (ref 3.8–10.6)

## 2013-07-05 LAB — PHOSPHORUS: Phosphorus: 1.2 mg/dL — ABNORMAL LOW (ref 2.5–4.9)

## 2013-07-05 LAB — VANCOMYCIN, TROUGH: Vancomycin, Trough: 9 ug/mL — ABNORMAL LOW (ref 10–20)

## 2013-07-05 LAB — MAGNESIUM: Magnesium: 0.8 mg/dL — ABNORMAL LOW

## 2013-07-06 LAB — BASIC METABOLIC PANEL
Anion Gap: 7 (ref 7–16)
BUN: 4 mg/dL — ABNORMAL LOW (ref 7–18)
CALCIUM: 7.2 mg/dL — AB (ref 8.5–10.1)
CO2: 22 mmol/L (ref 21–32)
CREATININE: 1.17 mg/dL (ref 0.60–1.30)
Chloride: 111 mmol/L — ABNORMAL HIGH (ref 98–107)
EGFR (Non-African Amer.): >60
Glucose: 95 mg/dL (ref 65–99)
OSMOLALITY: 276 (ref 275–301)
Potassium: 3.4 mmol/L — ABNORMAL LOW (ref 3.5–5.1)
Sodium: 140 mmol/L (ref 136–145)

## 2013-07-06 LAB — CBC WITH DIFFERENTIAL/PLATELET
BASOS ABS: 0.1 10*3/uL (ref 0.0–0.1)
BASOS PCT: 0.5 %
EOS ABS: 0.1 10*3/uL (ref 0.0–0.7)
Eosinophil %: 0.4 %
HCT: 32.5 % — AB (ref 40.0–52.0)
HGB: 10.8 g/dL — ABNORMAL LOW (ref 13.0–18.0)
LYMPHS PCT: 12 %
Lymphocyte #: 2.1 10*3/uL (ref 1.0–3.6)
MCH: 32.4 pg (ref 26.0–34.0)
MCHC: 33.4 g/dL (ref 32.0–36.0)
MCV: 97 fL (ref 80–100)
MONOS PCT: 7.2 %
Monocyte #: 1.2 x10 3/mm — ABNORMAL HIGH (ref 0.2–1.0)
NEUTROS ABS: 13.8 10*3/uL — AB (ref 1.4–6.5)
Neutrophil %: 79.9 %
Platelet: 97 10*3/uL — ABNORMAL LOW (ref 150–440)
RBC: 3.34 10*6/uL — AB (ref 4.40–5.90)
RDW: 14.9 % — AB (ref 11.5–14.5)
WBC: 17.2 10*3/uL — ABNORMAL HIGH (ref 3.8–10.6)

## 2013-07-10 LAB — CULTURE, BLOOD (SINGLE)

## 2013-07-18 LAB — CBC CANCER CENTER
BASOS PCT: 0.2 %
Basophil #: 0 x10 3/mm (ref 0.0–0.1)
EOS ABS: 0 x10 3/mm (ref 0.0–0.7)
EOS PCT: 0.2 %
HCT: 37.5 % — AB (ref 40.0–52.0)
HGB: 12.1 g/dL — ABNORMAL LOW (ref 13.0–18.0)
Lymphocyte #: 2.2 x10 3/mm (ref 1.0–3.6)
Lymphocyte %: 17.8 %
MCH: 31.2 pg (ref 26.0–34.0)
MCHC: 32.2 g/dL (ref 32.0–36.0)
MCV: 97 fL (ref 80–100)
Monocyte #: 0.7 x10 3/mm (ref 0.2–1.0)
Monocyte %: 5.9 %
Neutrophil #: 9.2 x10 3/mm — ABNORMAL HIGH (ref 1.4–6.5)
Neutrophil %: 75.9 %
Platelet: 185 x10 3/mm (ref 150–440)
RBC: 3.88 10*6/uL — ABNORMAL LOW (ref 4.40–5.90)
RDW: 14.9 % — ABNORMAL HIGH (ref 11.5–14.5)
WBC: 12.2 x10 3/mm — AB (ref 3.8–10.6)

## 2013-07-18 LAB — COMPREHENSIVE METABOLIC PANEL
ALT: 18 U/L (ref 12–78)
ANION GAP: 10 (ref 7–16)
Albumin: 2.8 g/dL — ABNORMAL LOW (ref 3.4–5.0)
Alkaline Phosphatase: 146 U/L — ABNORMAL HIGH
BILIRUBIN TOTAL: 0.4 mg/dL (ref 0.2–1.0)
BUN: 10 mg/dL (ref 7–18)
CO2: 28 mmol/L (ref 21–32)
Calcium, Total: 9.3 mg/dL (ref 8.5–10.1)
Chloride: 101 mmol/L (ref 98–107)
Creatinine: 1.21 mg/dL (ref 0.60–1.30)
EGFR (African American): >60
GLUCOSE: 137 mg/dL — AB (ref 65–99)
Osmolality: 279 (ref 275–301)
POTASSIUM: 2.7 mmol/L — AB (ref 3.5–5.1)
SGOT(AST): 20 U/L (ref 15–37)
SODIUM: 139 mmol/L (ref 136–145)
Total Protein: 8.6 g/dL — ABNORMAL HIGH (ref 6.4–8.2)

## 2013-07-19 LAB — CEA: CEA: 11.7 ng/mL — ABNORMAL HIGH (ref 0.0–4.7)

## 2013-07-29 ENCOUNTER — Ambulatory Visit: Payer: Self-pay | Admitting: Oncology

## 2013-08-05 LAB — COMPREHENSIVE METABOLIC PANEL
ALK PHOS: 138 U/L — AB
ALT: 20 U/L (ref 12–78)
ANION GAP: 12 (ref 7–16)
AST: 22 U/L (ref 15–37)
Albumin: 2.6 g/dL — ABNORMAL LOW (ref 3.4–5.0)
BUN: 8 mg/dL (ref 7–18)
Bilirubin,Total: 0.4 mg/dL (ref 0.2–1.0)
CALCIUM: 9.2 mg/dL (ref 8.5–10.1)
CHLORIDE: 98 mmol/L (ref 98–107)
CO2: 25 mmol/L (ref 21–32)
Creatinine: 1.17 mg/dL (ref 0.60–1.30)
EGFR (African American): >60
Glucose: 196 mg/dL — ABNORMAL HIGH (ref 65–99)
Osmolality: 274 (ref 275–301)
POTASSIUM: 3.2 mmol/L — AB (ref 3.5–5.1)
Sodium: 135 mmol/L — ABNORMAL LOW (ref 136–145)
Total Protein: 8.3 g/dL — ABNORMAL HIGH (ref 6.4–8.2)

## 2013-08-05 LAB — CBC CANCER CENTER
Basophil #: 0.2 x10 3/mm — ABNORMAL HIGH (ref 0.0–0.1)
Basophil %: 1.4 %
EOS ABS: 0 x10 3/mm (ref 0.0–0.7)
Eosinophil %: 0.2 %
HCT: 33.8 % — ABNORMAL LOW (ref 40.0–52.0)
HGB: 10.8 g/dL — ABNORMAL LOW (ref 13.0–18.0)
Lymphocyte #: 2.4 x10 3/mm (ref 1.0–3.6)
Lymphocyte %: 18.1 %
MCH: 30.2 pg (ref 26.0–34.0)
MCHC: 31.9 g/dL — AB (ref 32.0–36.0)
MCV: 95 fL (ref 80–100)
MONOS PCT: 6.3 %
Monocyte #: 0.8 x10 3/mm (ref 0.2–1.0)
NEUTROS PCT: 74 %
Neutrophil #: 9.8 x10 3/mm — ABNORMAL HIGH (ref 1.4–6.5)
PLATELETS: 142 x10 3/mm — AB (ref 150–440)
RBC: 3.57 10*6/uL — ABNORMAL LOW (ref 4.40–5.90)
RDW: 14.6 % — AB (ref 11.5–14.5)
WBC: 13.3 x10 3/mm — AB (ref 3.8–10.6)

## 2013-08-19 LAB — CBC CANCER CENTER
Basophil #: 0.1 x10 3/mm (ref 0.0–0.1)
Basophil %: 0.8 %
EOS PCT: 0.4 %
Eosinophil #: 0 x10 3/mm (ref 0.0–0.7)
HCT: 33.9 % — AB (ref 40.0–52.0)
HGB: 11.2 g/dL — ABNORMAL LOW (ref 13.0–18.0)
LYMPHS PCT: 32.4 %
Lymphocyte #: 3 x10 3/mm (ref 1.0–3.6)
MCH: 30.8 pg (ref 26.0–34.0)
MCHC: 33 g/dL (ref 32.0–36.0)
MCV: 93 fL (ref 80–100)
Monocyte #: 1 x10 3/mm (ref 0.2–1.0)
Monocyte %: 11 %
NEUTROS ABS: 5.1 x10 3/mm (ref 1.4–6.5)
NEUTROS PCT: 55.4 %
Platelet: 176 x10 3/mm (ref 150–440)
RBC: 3.62 10*6/uL — AB (ref 4.40–5.90)
RDW: 15.2 % — AB (ref 11.5–14.5)
WBC: 9.2 x10 3/mm (ref 3.8–10.6)

## 2013-08-19 LAB — BASIC METABOLIC PANEL
Anion Gap: 9 (ref 7–16)
BUN: 7 mg/dL (ref 7–18)
CALCIUM: 9 mg/dL (ref 8.5–10.1)
CHLORIDE: 100 mmol/L (ref 98–107)
CREATININE: 0.93 mg/dL (ref 0.60–1.30)
Co2: 27 mmol/L (ref 21–32)
Glucose: 96 mg/dL (ref 65–99)
OSMOLALITY: 270 (ref 275–301)
POTASSIUM: 3.3 mmol/L — AB (ref 3.5–5.1)
SODIUM: 136 mmol/L (ref 136–145)

## 2013-08-29 ENCOUNTER — Ambulatory Visit: Payer: Self-pay | Admitting: Oncology

## 2013-09-28 ENCOUNTER — Ambulatory Visit: Payer: Self-pay | Admitting: Oncology

## 2013-09-28 LAB — CBC CANCER CENTER
Basophil #: 0.1 x10 3/mm (ref 0.0–0.1)
Basophil %: 0.9 %
EOS ABS: 0.1 x10 3/mm (ref 0.0–0.7)
Eosinophil %: 1.1 %
HCT: 39.5 % — AB (ref 40.0–52.0)
HGB: 12.5 g/dL — AB (ref 13.0–18.0)
Lymphocyte #: 3.8 x10 3/mm — ABNORMAL HIGH (ref 1.0–3.6)
Lymphocyte %: 35.7 %
MCH: 29.9 pg (ref 26.0–34.0)
MCHC: 31.8 g/dL — ABNORMAL LOW (ref 32.0–36.0)
MCV: 94 fL (ref 80–100)
MONO ABS: 1.1 x10 3/mm — AB (ref 0.2–1.0)
MONOS PCT: 10.3 %
NEUTROS ABS: 5.6 x10 3/mm (ref 1.4–6.5)
Neutrophil %: 52 %
Platelet: 151 x10 3/mm (ref 150–440)
RBC: 4.2 10*6/uL — ABNORMAL LOW (ref 4.40–5.90)
RDW: 15.6 % — ABNORMAL HIGH (ref 11.5–14.5)
WBC: 10.7 x10 3/mm — ABNORMAL HIGH (ref 3.8–10.6)

## 2013-09-28 LAB — BASIC METABOLIC PANEL
ANION GAP: 6 — AB (ref 7–16)
BUN: 11 mg/dL (ref 7–18)
CALCIUM: 9.4 mg/dL (ref 8.5–10.1)
CO2: 29 mmol/L (ref 21–32)
Chloride: 104 mmol/L (ref 98–107)
Creatinine: 1.04 mg/dL (ref 0.60–1.30)
Glucose: 93 mg/dL (ref 65–99)
OSMOLALITY: 277 (ref 275–301)
Potassium: 3.9 mmol/L (ref 3.5–5.1)
SODIUM: 139 mmol/L (ref 136–145)

## 2013-09-29 LAB — CEA: CEA: 8.3 ng/mL — ABNORMAL HIGH (ref 0.0–4.7)

## 2013-10-29 ENCOUNTER — Ambulatory Visit: Payer: Self-pay | Admitting: Oncology

## 2013-11-29 ENCOUNTER — Ambulatory Visit: Payer: Self-pay | Admitting: Oncology

## 2013-12-29 ENCOUNTER — Ambulatory Visit: Payer: Self-pay | Admitting: Oncology

## 2013-12-30 LAB — CBC CANCER CENTER
Bands: 2 %
Basophil: 1 %
Eosinophil: 1 %
HCT: 45.1 % (ref 40.0–52.0)
HGB: 15.1 g/dL (ref 13.0–18.0)
Lymphocytes: 34 %
MCH: 31 pg (ref 26.0–34.0)
MCHC: 33.5 g/dL (ref 32.0–36.0)
MCV: 93 fL (ref 80–100)
METAMYELOCYTE: 7 %
MONOS PCT: 11 %
Platelet: 126 x10 3/mm — ABNORMAL LOW (ref 150–440)
RBC: 4.86 10*6/uL (ref 4.40–5.90)
RDW: 14.4 % (ref 11.5–14.5)
SEGMENTED NEUTROPHILS: 44 %
WBC: 10.1 x10 3/mm (ref 3.8–10.6)

## 2013-12-30 LAB — COMPREHENSIVE METABOLIC PANEL
ALT: 19 U/L
ANION GAP: 7 (ref 7–16)
Albumin: 3.7 g/dL (ref 3.4–5.0)
Alkaline Phosphatase: 98 U/L
BILIRUBIN TOTAL: 0.4 mg/dL (ref 0.2–1.0)
BUN: 12 mg/dL (ref 7–18)
CO2: 28 mmol/L (ref 21–32)
Calcium, Total: 9.6 mg/dL (ref 8.5–10.1)
Chloride: 102 mmol/L (ref 98–107)
Creatinine: 1.09 mg/dL (ref 0.60–1.30)
Glucose: 67 mg/dL (ref 65–99)
OSMOLALITY: 272 (ref 275–301)
POTASSIUM: 3.5 mmol/L (ref 3.5–5.1)
SGOT(AST): 17 U/L (ref 15–37)
Sodium: 137 mmol/L (ref 136–145)
Total Protein: 8.2 g/dL (ref 6.4–8.2)

## 2013-12-30 LAB — MAGNESIUM: Magnesium: 2.1 mg/dL

## 2014-01-02 LAB — CEA: CEA: 8 ng/mL — AB (ref 0.0–4.7)

## 2014-01-29 ENCOUNTER — Ambulatory Visit: Payer: Self-pay | Admitting: Oncology

## 2014-02-28 ENCOUNTER — Ambulatory Visit: Payer: Self-pay | Admitting: Oncology

## 2014-04-14 ENCOUNTER — Ambulatory Visit: Payer: Self-pay | Admitting: Oncology

## 2014-05-01 ENCOUNTER — Ambulatory Visit: Payer: Self-pay | Admitting: Oncology

## 2014-05-17 ENCOUNTER — Ambulatory Visit: Payer: Self-pay | Admitting: Hematology and Oncology

## 2014-05-30 ENCOUNTER — Ambulatory Visit
Admit: 2014-05-30 | Disposition: A | Discharge status: Home / Self Care | Payer: Self-pay | Attending: Hematology and Oncology | Admitting: Hematology and Oncology

## 2014-05-30 ENCOUNTER — Ambulatory Visit
Admit: 2014-05-30 | Disposition: A | Discharge status: Home / Self Care | Payer: Self-pay | Attending: Oncology | Admitting: Oncology

## 2014-06-28 LAB — COMPREHENSIVE METABOLIC PANEL
ALBUMIN: 4.3 g/dL
ALK PHOS: 97 U/L
ALT: 14 U/L — AB
AST: 20 U/L
Anion Gap: 6 — ABNORMAL LOW (ref 7–16)
BUN: 13 mg/dL
Bilirubin,Total: 0.5 mg/dL
CALCIUM: 9.2 mg/dL
CREATININE: 0.94 mg/dL
Chloride: 105 mmol/L
Co2: 24 mmol/L
EGFR (African American): >60
Glucose: 102 mg/dL — ABNORMAL HIGH
Potassium: 3.9 mmol/L
SODIUM: 135 mmol/L
TOTAL PROTEIN: 8.1 g/dL

## 2014-06-28 LAB — CBC CANCER CENTER
Basophil #: 0.2 x10 3/mm — ABNORMAL HIGH (ref 0.0–0.1)
Basophil %: 2.4 %
Eosinophil #: 0.1 x10 3/mm (ref 0.0–0.7)
Eosinophil %: 1 %
HCT: 47 % (ref 40.0–52.0)
HGB: 15.9 g/dL (ref 13.0–18.0)
LYMPHS ABS: 3.2 x10 3/mm (ref 1.0–3.6)
Lymphocyte %: 33.2 %
MCH: 31.2 pg (ref 26.0–34.0)
MCHC: 33.9 g/dL (ref 32.0–36.0)
MCV: 92 fL (ref 80–100)
MONO ABS: 0.6 x10 3/mm (ref 0.2–1.0)
Monocyte %: 6.5 %
Neutrophil #: 5.4 x10 3/mm (ref 1.4–6.5)
Neutrophil %: 56.9 %
Platelet: 124 x10 3/mm — ABNORMAL LOW (ref 150–440)
RBC: 5.1 10*6/uL (ref 4.40–5.90)
RDW: 14.3 % (ref 11.5–14.5)
WBC: 9.3 x10 3/mm (ref 3.8–10.6)

## 2014-06-30 ENCOUNTER — Ambulatory Visit
Admit: 2014-06-30 | Disposition: A | Discharge status: Home / Self Care | Payer: Self-pay | Attending: Oncology | Admitting: Oncology

## 2014-07-05 ENCOUNTER — Ambulatory Visit
Admit: 2014-07-05 | Disposition: A | Discharge status: Home / Self Care | Payer: Self-pay | Attending: Hematology and Oncology | Admitting: Hematology and Oncology

## 2014-07-14 ENCOUNTER — Other Ambulatory Visit: Payer: Self-pay | Admitting: Hematology and Oncology

## 2014-07-14 DIAGNOSIS — J984 Other disorders of lung: Secondary | ICD-10-CM

## 2014-07-21 NOTE — Op Note (Signed)
PATIENT NAME:  [name], [name] MR#:  [id number] DATE OF BIRTH:  [birth year]  DATE OF PROCEDURE:  12/28/2012  ATTENDING PHYSICIAN: Dr. Chauncey Reading.  PREOPERATIVE DIAGNOSIS: Colon cancer, need for stable IV for chemotherapy.   POSTOPERATIVE DIAGNOSIS: Colon cancer, need for stable IV for chemotherapy.   PROCEDURE PERFORMED: Left subclavian Port-A-Cath placement.   ANESTHESIA: MAC local.  ESTIMATED BLOOD LOSS: 5 mL.  COMPLICATIONS: None.   IMPLANT: Left subclavian Port-A-Cath.   INDICATION FOR PROCEDURE: William Ford is a pleasant 64 year old male with a history of colon cancer, who underwent (Dictation Anomaly)<<MISSING TEXT>>  hemicolectomy. He did have positive radial margin and decision was made to undergo chemotherapy.   DETAILS OF PROCEDURE: Informed consent was obtained. William Ford was brought to the operating room suite. He was laid supine on the operating room table. He was given moderate sedation. His left chest was prepped and draped in standard surgical fashion. A timeout was then performed correctly identifying the patient name, operative site and procedure to be performed. At the lateral 2/3 of his clavicle, an access needle was placed under the clavicle and in towards the sternal notch. The subclavian vein was accessed with 2 needle sticks, the second more lateral than the first. There was fresh flow of nonpulsatile blood. The wire was then placed through the access needle. Fluoro came in  and it showed that it went to the superior vena cava. I then removed the needle and formed a pocket with the Port-A-Cath. I then placed two 3-0 Prolene stay sutures into the chest wall and through the Port-A-Cath. I then placed the Port-A-Cath aside and put the dilator over the access wire. This was removed. The dilator and sheath combination was then placed over the access wire. The fluoro was then examined to ensure that the sheath was headed in the right direction and the dilator and wire were  then removed. The Port-A-Cath catheter was then placed through the sheath to the level of the cavoatrial junction. The sheath was then peeled away. The catheter was then clipped and attached to the port. A Huber needle each used to access the port which drew and flushed well, then loaded with heparin and saline. The catheter was then fixed to the chest wall using the previously placed 3-0 Prolene sutures. The wound was then closed with a deep dermal 4-0.   3-0 Vicryl. 4-0 Monocryl subcuticular. Steri-Strips, Telfa gauze and Tegaderm were then placed over the wound. William Ford was then awakened and brought to the postanesthesia care unit. There were no immediate complications. Needle, sponge, and instrument count was correct at the end of the procedure.    ____________________________ Glena Norfolk. Vianne Grieshop, MD cal:sg D: 12/29/2012 11:58:00 ET T: 12/29/2012 12:21:13 ET JOB#: [id number]  cc: Harrell Gave A. Sharone Almond, MD, <Dictator>

## 2014-07-21 NOTE — Consult Note (Signed)
PATIENT NAME:  William Ford, William Ford MR#:  [id number] DATE OF BIRTH:  Aug 28, 1950  DATE OF CONSULTATION:  11/05/2012  REFERRING PHYSICIAN:  Micheline Maze, MD CONSULTING PHYSICIAN:  Shantae Vantol H. Posey Pronto, MD  PRIMARY CARE PROVIDER: According to the patient, is [facility] A. Elijio Miles, MD.   REASON FOR CONSULTATION: Opinion regarding the patient's hypertension, history of anemia, nicotine addiction.   HISTORY OF PRESENT ILLNESS: The patient is a 64 year old African-American male who has been diagnosed with right-sided colon cancer, who underwent a robotic-assisted colon resection. The patient tolerated the procedure without any complications. There was approximately estimated blood loss of 100 mL. The patient currently states that he does have some mild abdominal pain, but is doing otherwise okay. He denies any chest pain, shortness of breath, coughing, fever, nausea or vomiting. The patient has an NG tube in place that is still draining.   PAST MEDICAL HISTORY: Significant for:  1. History of colon cancer.  2. Hypertension.  3. History of anemia.  4. History of syncope, that is when he was diagnosed with the colon cancer.  5. History of electrolyte imbalances.   PAST SURGICAL HISTORY: Left foot surgery.   ALLERGIES: None.   CURRENT MEDICATIONS: He is on amlodipine 5 daily, and he uses ibuprofen p.r.n. for pain.   SOCIAL HISTORY: Smokes about 1 pack per day. Drinks occasionally. No drug use.   FAMILY HISTORY: Positive for hypertension and coronary artery disease.   REVIEW OF SYSTEMS:  CONSTITUTIONAL: Denies any fevers. No chills. Complains of some weakness. He states that he has lost about 10 pounds over the past few months.  HEENT: Denies any visual difficulties. No blurred vision. No cataracts. No glaucoma.  ENT: Denies any tinnitus. No ear pain. No hearing loss. Denies any nasal congestion or epistaxis. No seasonal or year-round allergies. No difficulty with swallowing.  CARDIOVASCULAR: Denies  any chest pains, palpitations. Has history of syncope that was felt to be due to dehydration. No arrhythmias.  PULMONARY: Denies any coughing, wheezing. No hemoptysis. No hematemesis. No hematochezia.  GASTROINTESTINAL: Colon cancer diagnosis after syncope.  MUSCULOSKELETAL: Denies any joint pain or swelling. No gout.  NEUROLOGIC: Denies any numbness, CVA, TIA or seizures.  PSYCHIATRIC: Denies any anxiety, insomnia. No ADD.  LYMPHATICS: Denies any lymph node enlargement.  VASCULAR: Denies any claudication symptoms.   PHYSICAL EXAMINATION:  VITAL SIGNS: Temperature 97.2, pulse 106, respirations 16, blood pressure 144/81, O2 94%.  GENERAL: The patient is a well-developed African-American male in no acute distress. A little sleepy from the pain medications.  HEENT: Head atraumatic, normocephalic. Pupils equally round and reactive to light and accommodation. There is no conjunctival pallor. No scleral icterus. Nasal exam shows no drainage or ulceration. Oropharynx is clear without any exudate.  NECK: Supple. No JVD.  CARDIOVASCULAR: Regular rate and rhythm. No murmurs, rubs, clicks or gallops. PMI is not displaced.  LUNGS: Clear to auscultation bilaterally without any rales, rhonchi or wheezing.  ABDOMEN: Postoperative, nondistended.  EXTREMITIES: No clubbing, cyanosis or edema.  SKIN: No rash.  LYMPHATICS: No lymph nodes palpable.  VASCULAR: Good DP AND PT pulses.  PSYCHIATRIC: Not anxious or depressed.  NEUROLOGIC: Awake, alert and oriented x3. Cranial nerves II through XII grossly intact. No focal deficits.  DIAGNOSTIC DATA: Labs done on the 31st of July reviewed. His potassium was 3.4. His BUN was 5, creatinine 0.85, sodium was 136, calcium was 9.4. WBC 9.8, hemoglobin 13.9, platelet count 274.   ASSESSMENT AND PLAN: The patient is a 64 year old African-American male with  colon cancer, status post colon resection, with hypertension.   1. Hypertension. At this time, his blood pressure is  normal. Would recommend holding the Norvasc for the time being since he has an NG tube in place. I will start him on p.r.n. hydralazine with blood pressure parameters for systolic greater than 811, diastolic greater than 94. Once he is able to take p.o., we could restart his amlodipine. Will adjust his blood pressure regimen as needed.  2. History of hypokalemia. Will repeat potassium tomorrow. If needed, will replace his potassium.  3. History of anemia. His hemoglobin is currently stable. Likely, his hemoglobin will drop. Will monitor his hemoglobin closely and transfuse as needed.  4. Nicotine addiction. Discussed with the patient regarding importance of quitting smoking. Nicotine patch offered. He states that he will be able to quit on his own. If needed, we can provide him with nicotine patch. Four minutes spent.   TIME SPENT: Note, 45 minutes spent on H and P/Consult.  ____________________________ Lafonda Mosses. Posey Pronto, MD shp:OSi D: 11/05/2012 14:08:47 ET T: 11/05/2012 14:15:47 ET JOB#: [id number]  cc: Jaymi Tinner H. Posey Pronto, MD, <Dictator> Alric Seton MD ELECTRONICALLY SIGNED 11/08/2012 10:34

## 2014-07-21 NOTE — Op Note (Signed)
PATIENT NAME:  William Ford, STRIKE MR#:  [id number] DATE OF BIRTH:  Dec 10, 1950  DATE OF PROCEDURE:  12/28/2012  ATTENDING PHYSICIAN: Dr. Chauncey Reading.  PREOPERATIVE DIAGNOSIS: Colon cancer, need for stable IV for chemotherapy.   POSTOPERATIVE DIAGNOSIS: Colon cancer, need for stable IV for chemotherapy.   PROCEDURE PERFORMED: Left subclavian Port-A-Cath placement.   ANESTHESIA: MAC local.  ESTIMATED BLOOD LOSS: 5 mL.  COMPLICATIONS: None.   IMPLANT: Left subclavian Port-A-Cath.   INDICATION FOR PROCEDURE: Mr. Clubb is a pleasant 64 year old male with a history of colon cancer, who underwent robotic  hemicolectomy. He did have positive radial margin and decision was made to undergo chemotherapy.   DETAILS OF PROCEDURE: Informed consent was obtained. Mr. Harcum was brought to the operating room suite. He was laid supine on the operating room table. He was given moderate sedation. His left chest was prepped and draped in standard surgical fashion. A timeout was then performed correctly identifying the patient name, operative site and procedure to be performed. At the lateral 2/3 of his clavicle, an access needle was placed under the clavicle and in towards the sternal notch. The subclavian vein was accessed with 2 needle sticks, the second more lateral than the first. There was fresh flow of nonpulsatile blood. The wire was then placed through the access needle. Fluoro came in  and it showed that it went to the superior vena cava. I then removed the needle and formed a pocket with the Port-A-Cath. I then placed two 3-0 Prolene stay sutures into the chest wall and through the Port-A-Cath. I then placed the Port-A-Cath aside and put the dilator over the access wire. This was removed. The dilator and sheath combination was then placed over the access wire. The fluoro was then examined to ensure that the sheath was headed in the right direction and the dilator and wire were then removed. The Port-A-Cath  catheter was then placed through the sheath to the level of the cavoatrial junction. The sheath was then peeled away. The catheter was then clipped and attached to the port. A Huber needle each used to access the port which drew and flushed well, then loaded with heparin and saline. The catheter was then fixed to the chest wall using the previously placed 3-0 Prolene sutures. The wound was then closed with a deep dermal 4-0.   3-0 Vicryl. 4-0 Monocryl subcuticular. Steri-Strips, Telfa gauze and Tegaderm were then placed over the wound. Mr. Niazi was then awakened and brought to the postanesthesia care unit. There were no immediate complications. Needle, sponge, and instrument count was correct at the end of the procedure.    ____________________________ Glena Norfolk. Rodert Hinch, MD cal:sg D: 12/29/2012 11:58:00 ET T: 12/29/2012 12:21:13 ET JOB#: [id number]  cc: Harrell Gave A. Geovanie Winnett, MD, <Dictator>  Floyde Parkins MD ELECTRONICALLY SIGNED 01/10/2013 21:13

## 2014-07-21 NOTE — Discharge Summary (Signed)
PATIENT NAME:  William Ford, William Ford MR#:  [id number] DATE OF BIRTH:  May 22, 1950  DATE OF ADMISSION:  11/05/2012 DATE OF DISCHARGE:  11/09/2012  BRIEF HISTORY: William Ford is a 64 year old gentleman with recently discovered right colon carcinoma and a splenic flexure polyp. After appropriate preoperative preparation and informed consent, he was taken to surgery on the morning of 11/05/2012. He had undergone an antibiotic and mechanical bowel prep. He underwent a robotic-assisted laparoscopic right colectomy and a laparoscopic transverse colectomy. The procedure was uncomplicated. He had no significant intraoperative problems. He had rapid return of bowel function. He was able to tolerate a regular diet by the 12th. He was discharged home on the morning of the 13th to be followed in the office in 7 to 10 days' time. Bathing, activity, and driving instructions were given to the patient. He is to resume his medications, which include amlodipine 5 mg once a day and he was given Vicodin for pain.   FINAL DISCHARGE DIAGNOSIS: Colon carcinoma.   SURGERY: Robotic-assisted right colectomy,  laparoscopic transverse colectomy. ____________________________ Micheline Maze, MD rle:aw D: 11/15/2012 22:39:00 ET T: 11/16/2012 08:34:58 ET JOB#: [id number]  cc: Micheline Maze, MD, <Dictator> Lucilla Lame, MD Ocie Cornfield. Ouida Sills, [place] MD ELECTRONICALLY SIGNED 11/16/2012 19:26

## 2014-07-21 NOTE — Op Note (Signed)
PATIENT NAME:  William Ford, William Ford MR#:  [id number] DATE OF BIRTH:  11/19/1950  DATE OF PROCEDURE:  11/05/2012  PREOPERATIVE DIAGNOSES: Right colon carcinoma, splenic flexure polyp.   POSTOPERATIVE DIAGNOSES:  Right colon carcinoma, splenic flexure polyp.   OPERATION: Robotic-assisted laparoscopic right colectomy, distal transverse colectomy.   SURGEON: Rodena Goldmann, M.D.   ASSISTANTGardiner Sleeper, [place] student, and [place], [place] student.   ANESTHESIA: General.   OPERATIVE PROCEDURE: With the patient in the supine position after induction of appropriate general anesthesia and placement of a Foley catheter, the patient's abdomen was prepped with ChloraPrep and draped with sterile towels. A left midabdominal incision was made and carried down bluntly through the subcutaneous tissue. The tracheal hook was used to elevate the abdominal wall, and a Veress needle was used to cannulate the peritoneal cavity without difficulty. CO2 was insufflated to appropriate pressure measurements. When approximately 2.5 liters of CO2 were instilled, the Veress needle was withdrawn. An 11 mm [employer] port was inserted into the peritoneal cavity. Intraperitoneal position was confirmed, and CO2 was reinsufflated. A suprapubic and subxiphoid port were placed under direct vision using the robotic port, and a left upper quadrant transverse incision was made for assistant port, again using a 10 mm Applied port. The patient was then placed in the appropriate position. The robot was brought to the table and docked to the ports.  I moved to the console. The right colon was taken down without difficulty. A combination of Bovie and blunt dissection was used to mobilize right colon from the terminal ileum to the midtransverse colon. There appeared to be some adhesions laterally were I anticipate the tumor would be.  Did not appear to be any evidence of significant bleeding. Once satisfactory exposure was obtained, the robot was undocked,  and the ports removed. A midline incision was made around the umbilicus and carried down through the subcutaneous tissue with Bovie electrocautery. The fascia was identified and opened the length of the skin incision, as was the peritoneum. The right colon was mobilized in the incision without difficulty.  The tumor appeared to be an apple-core lesion in the proximal ascending colon. An area was chosen on the terminal ileum and proximal transverse colon for the resection.  The bowel was divided with the GIA stapling device at that point in both areas. Mesentery was taken down with LigaSure apparatus with the exception of the ileocolic pedicle, which was independently isolated and individually ligated with 0 Vicryl.  The specimen was passed off the table. Two loops of bowel were placed side by side, and a small enterotomy made in each loop of bowel. The GIA 75 stapling device was used for the anastomosis, 1 limb in each loop of bowel. The stapler was fired. The anastomosis was examined, and no significant bleeding was encountered. The enterotomy was closed with a single application of TX stapling device carrying a blue load. The mesenteric defect was closed with 3-0 Vicryl. The bowel contents were returned to their anatomic position, and the remainder of the transverse colon elevated into the wound. There did appear to be a distal polyp at the site of the previous [place] ink tattoo. The splenic flexure was then mobilized using the LigaSure apparatus down to the mid descending colon, allowing mobilization of the colon into the midline. Proximal bowel was divided with the TX stapling device. The mesentery was taken down with LigaSure apparatus, in an area chosen just distal to the ink,  where the pursestring clamp was placed. The  bowel was passed off the table. A Look 2-0 nylon suture was placed through the pursestring device. The bowel appeared to be large enough to accommodate a 29 mm stapling device. The EEA 29 was  brought to the table. The anvil was disconnected and inserted into the distal bowel and secured with the pursestring. The proximal bowel was then examined and a longitudinal enterotomy performed. The body of the stapler was inserted through the enterotomy and spike driven out through the proximal staple line. The stapler was then married to the anvil, which was approximated and fired. The anastomosis was palpated. No significant abnormality was identified. There were no defects noted. Rings were intact. The enterotomy was closed in transverse fashion using the TX 60 stapling device. The mesenteric defect was closed with 3-0 Vicryl. Bowel contents were returned to their anatomic position. The wounds were redraped and gowns and gloves changed. The area was copiously irrigated. Midline fascia was closed with running suture of looped #1 PDS. The knot was buried. A Penrose drain was placed in the bed of the incision. The incision stapled. Sterile dressings were applied. The patient was transferred to the recovery room, and he tolerated the procedure well.   Sponge, needle and instrument counts were correct x 2 in the operating room.   ____________________________ Rodena Goldmann III, MD rle:dmm D: 11/05/2012 11:24:00 ET T: 11/05/2012 11:43:17 ET JOB#: [id number]  cc: Lupita Dawn. Candace Cruise, MD Ocie Cornfield. Ouida Sills, MD Rodena Goldmann III, MD, <Dictator>   Rodena Goldmann MD ELECTRONICALLY SIGNED 11/05/2012 16:58

## 2014-07-22 NOTE — Discharge Summary (Signed)
PATIENT NAME:  William Ford, William Ford MR#:  [id number] DATE OF BIRTH:  09-07-1950  DATE OF ADMISSION:  06/27/2013 DATE OF DISCHARGE: 07/06/2013   DISCHARGE DIAGNOSES:  1.  Small bowel obstruction.  2.  Colon cancer, post treatment, not appearing metastatic by PET scan presently.  3.  Leukocytosis.  4.  Hypotension postoperative.  5.  History of diabetes, now seemingly controlled on sliding scale only.  6.  Acute renal failure, likely acute tubular necrosis. 7.  Hypokalemia.  DISCHARGE MEDICATIONS: Augmentin 875 b.i.d. Otherwise per [facility] med reconciliation system. Please see for details.   HISTORY AND PHYSICAL: Please see detailed history and physical done on admission.   HOSPITAL COURSE: The patient was admitted with small bowel obstruction on an NG tube. He initially improved. Surgery followed him and asked for his NG tube to be clamped and then removed. He did have recurrent disease, was quite sick and ultimately ended up with a surgical lysis of adhesions. He improved after that and was eating and ambulating without difficulty. He was given fluids for prerenal and likely acute tubular necrosis and acute renal failure. That improved. His renal function was back to normal by the time of discharge. Only remaining issue was persistent mild to moderate leukocytosis in the 15 to 17 range. Relatively stable over the past couple of days. Surgery felt he was stable for discharge. We will discharge him on Augmentin for a week in case this is a postoperative infection, which is the most likely source as no other source is there. However, as he is eating well, tolerating diet and walking, he does not need to be hospitalized for this at this point. We will have a CBC done in 2 days as an outpatient. He will be seen in 4 or 5 days by surgery and in my office for close followup. He understands to call with any further difficulty.   TIME SPENT: It took approximately 35 minutes to do all discharge tasks.   ____________________________ Ocie Cornfield. Ouida Sills, MD mwa:aw D: 07/06/2013 08:58:44 ET T: 07/06/2013 09:05:46 ET JOB#: [id number]  cc: Ocie Cornfield. Ouida Sills, MD, <Dictator> Kirk Ruths MD ELECTRONICALLY SIGNED 07/07/2013 17:34

## 2014-07-22 NOTE — H&P (Signed)
PATIENT NAME:  William Ford, William Ford MR#:  [id number] DATE OF BIRTH:  1950-09-13  DATE OF ADMISSION:  06/27/2013  REASON FOR ADMISSION: Dehydration, weakness, acute kidney injury.   REFERRING PHYSICIAN: Dr. Carrie Mew.  PRIMARY CARE PHYSICIAN: Dr. Frazier Richards.  HISTORY OF PRESENT ILLNESS: This is a very nice 64 year old gentleman with history of colon cancer diagnosed in 2014 status post right colectomy. The patient comes today with a history of getting chemotherapy. Last time that he got chemotherapy was Friday a week ago. The patient started having significant stomach problems, vomiting and nausea, starting a week ago on Monday. The patient was not better despite the fact that he was able to tolerate fluids and food. He still felt pretty nauseated and vomited at least 3 to 5 times a day. The patient started to get really weak, unable to really get up and walk around, feeling significantly dehydrated and vomiting at least 3 to 5 times a day. The characteristics of the vomit, the patient states that he has bilious color, green color vomit, mostly fluid. The patient again is able to eat and drink, but not good amounts. He is stating that he had fever and chills back probably 4 to 5 days ago, but not since then. The patient had epigastric pain exactly a week ago that lasted for about an hour and went away and has not come back since then. He had his last bowel movement 2 days ago. He denies any diarrhea. The patient has undergone right colectomy, distal transverse colectomy on November 05, 2012, and at that moment his tumor was stage T3, N0 M0. There were 17 lymph nodes that were negative for malignancy, and it was described as stage IIa. There was some lymphovascular invasion though. The patient started on FOLFOX in October of 2014. The patient has been having issues with dehydration and electrolyte imbalances for a while. He has been given Decadron and Phenergan after each chemotherapy as well as  Compazine, and his kidney function started to rise. The patient also has rising of his CEA levels and a repeat PET scan has been ordered but not done yet. The patient is admitted today due to acute kidney failure and consulting nephrology.   REVIEW OF SYSTEMS: A 12 system review is done.  CONSTITUTIONAL: No fever right now. He had fever 3 to 4 days ago. Positive for significant fatigue. Positive weakness. Pain is negative. No significant weight loss or weight gain recently.  EYES: No blurry vision, double vision.  EARS, NOSE, THROAT: No difficulty swallowing or tinnitus.  RESPIRATORY: No cough, wheezing, or hemoptysis.  CARDIOVASCULAR: No chest pain, orthopnea, or edema.  GASTROINTESTINAL: Positive nausea. Positive vomiting. No abdominal pain at this moment. No hematemesis. No melena. No ulcers. No GERD. No jaundice. No rectal bleeding.  GENITOURINARY: No dysuria, hematuria. Positive change in frequency. The patient states that he has been urinating very small amounts recently, in the past 3 to 4 days, only urinates once a day.  ENDOCRINE: No polyuria or polydipsia.  HEMATOLOGIC AND LYMPHATIC: No easy bruising. Positive chronic anemia. Negative bleeding. Negative swollen glands.  SKIN: No rash or petechiae.  MUSCULOSKELETAL: No significant neck pain. Positive hand pain due to osteoarthritis.  NEUROLOGIC: No numbness, tingling, vertigo.  PSYCHIATRIC: No insomnia or nervousness.   PAST MEDICAL HISTORY: 1.  Colon cancer.  2.  Hypertension.  3.  Chronic disease anemia.  4.  Osteoarthritis. 5.  Chronic hypokalemia. 6.  Previous history of syncope.   PAST SURGICAL HISTORY: 1.  Left foot surgery with toe amputation.  2.  Right colectomy.   ALLERGIES: No known drug allergies.   CURRENT MEDICATIONS: Amlodipine 5 mg daily, Zofran as needed for nausea, and Ensure daily as needed.   SOCIAL HISTORY: The patient is a smoker. He smokes 2 cigarettes a day. Smoking cessation counseling given to the  patient today for 3 minutes and he is deciding to quit today. He does not drink. He lives with his mother. He is disabled due to osteoarthritis.  FAMILY HISTORY: Negative for cancer. Negative for MI. Positive for hypertension in his parents.   PHYSICAL EXAMINATION: VITAL SIGNS: Blood pressure 99/66, pulse 99, respirations 18, temperature 98, oxygen saturation 98% on 2 liters of oxygen. GENERAL: Alert and oriented x3, in no acute distress. No respiratory distress. Hemodynamically stable.  HEENT: Pupils are equal and reactive. Extraocular movements are intact. Mucosa is dry. Anicteric sclerae. Pink conjunctivae. No oral lesions. No oropharyngeal exudates.  NECK: Supple. No JVD. No thyromegaly or adenopathy.  HEART: Unremarkable. Regular rate and rhythm. No murmurs, rubs or gallops. No displacement of PMI.  LUNGS: Clear without any wheezing or crepitus. No use of accessory muscles.  ABDOMEN: Soft, nontender, nondistended. No hepatosplenomegaly. No masses. Bowel sounds are positive. No significant distention. Costovertebral angle tenderness is negative.  GENITAL: Negative for external lesions.  EXTREMITIES: No edema, cyanosis or clubbing pulses. Pulses +2. Capillary refill less than 3.  SKIN: A little dry with decreased turgor. The patient is thin and fits with mild level of malnutrition.  NEUROLOGIC: Cranial nerves II through XII intact. Strength is 5/5 in all 4 extremities. No focal findings.  PSYCHIATRIC: No anxiety or depression. LYMPHATIC: Negative for lymphadenopathy in the neck or supraclavicular area.  DIAGNOSTIC DATA:  Glucose 164, BUN 103, creatinine 5.09, sodium 125, potassium 3.1, chloride 79. Phosphorous is 10. Magnesium is 3.4. Lipase is 673. Uric acid is 17. White count is 14.5, hemoglobin 17.8, and platelet count 97,000. Urinalysis: 2 white blood cells. No other signs of urinary tract infection.   Chest x-ray: No signs of infection.   ASSESSMENT AND PLAN: This is a very nice  64 year old gentleman with history of colon cancer, now comes with acute kidney failure due to dehydration, nausea and vomiting.   ASSESSMENT AND PLAN: 1.  Acute kidney injury. The patient looks significantly dehydrated, has been vomiting. This happened just right after chemotherapy. He has not been taking any over-the-counter NSAIDs or any other nephrotoxin. The patient is taking FOLFOX for chemotherapy, which is fluorouracil, leucovorin and oxaliplatin. His urine is decreased. We are going to give him large amounts of IV fluids, try to rehydrate him, and replace his potassium. He is hyponatremic for what this is secondary to intravascular volume depletion. The patient looks contracted. His white count is 14,000, platelets 97,000 and hemoglobin is 17, and he usually is anemic for what I think this is secondary to fluid contraction. We are going add ultrasound of the kidneys, ANA, hepatitis panel and complement levels. The patient could be having a tumor lysis crisis, although clinically is not apparent. The patient does have elevation of uric acid at 17,000. We are going to add nephrology consultation and oncology consultation to evaluate that. The patient's lipase is 667, but no signs of pancreatitis. As far as his electrolyte imbalances, again replace potassium, replace sodium, IV fluids in order to help the magnesium to come down. At this moment, his magnesium is not causing any significant problems. His tendon reflexes are not slowed. There is no  significant respiratory compromise. If there are any significant symptoms like neuromuscular blockage, add calcium gluconate. For now I think it is wise to wait and recheck again. 2.  Increased liver function tests. This could be secondary to cancer. Since the patient has significant issues, we are going to do a CT scan of the abdomen. Ahis moment I am not able to do contrast for what we are going to do it without contrast. Okay to use p.o. contrast to evaluate for  metastatic disease as his CEA is going higher anyway. A KUB has been ordered already.  3.  Hypertension. Blood pressure is borderline low.  Hold amlodipine.  4.  As far as his anemia, actually the patient does not have any anemia anymore, although he looks fluid concentrated.  5.  Hyperphosphoremia. At this moment, continue IV fluids, add on PhosLo.  6.  Increased total protein. This could be secondary to fluid contraction, although consider the possibility of other cancers like multiple myeloma, although his calcium is normal. This could be secondary to colon cancer as well. 7.  Thrombocytopenia. The patient has platelets of 97,000. 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 CT of the abdomen will help [place] rule out the possibility of significant portal hypertension.  8.  Deep venous thrombosis prophylaxis with heparin, at a low dose, monitor platelets. If the platelets continue to drop, stop heparin.  9.  Gastrointestinal prophylaxis with Protonix.   The patient is a FULL code.   Transfer care to Dr. Frazier Richards.   TIME SPENT: About 60 minutes with this patient.  ____________________________ Dennard Sink, MD rsg:sb D: 06/27/2013 14:06:22 ET T: 06/27/2013 14:44:50 ET JOB#: [id number]  cc: [place] Sink, MD, <Dictator> Shina Wass America Brown MD ELECTRONICALLY SIGNED 07/05/2013 21:29

## 2014-07-22 NOTE — Consult Note (Signed)
Note Type Consult   HPI: Referred by Dr. Candace Cruise, Dr. Pat Patrick, PCP-Dr. Ouida Sills.   This 64 year old M patient presents to the clinic for follow up  for Colon Cancer.  Chief Complaint:  Historian Patient   Presenting Problem patient is here for evaluation regarding persistent hiccups with decreased oral intake. states symptoms started on monday and has not been able to tolerate anything by mouth including liquids. feeling weak today and having occasional stomach pains. no living will, FULL CODE.   Positive Symptoms weakness, vomiting   Negative Symptoms fever, chills, anorexia, nausea, diarrhea, constipation, cough, shortness of breath, palpitations, burning with urination, urinary frequency, headache, numbness/tingling, back pain, hip pain, rash   Other Symptoms hiccups, decreased oral intake   Subjective: Chief Complaint/Diagnosis:   1. Colonoscopy revealed infiltrating partially obstructing large massin the cecum o( July 16,2014)biopsy was adenocarcinoma, low-grade.  There was alsohigh-grade dysplasia in tubulous adenoma in the splenic flexure as well as multiple polyps.Patient underwent right colectomy the distal transversecolectomy on November 05, 2012.N0, M0 tumor was found (17 lymph nodes were found negative for malignancy )stage II aPatient is at high risk for recurrent disease based on the fact that circumference the margins were positive.invasion. as well as patient had 1 deposit  of metastases in peritoneum. 's tumor is proficient for mismatch repairStarted on FOLFOX from October 3: 3740 HPI:   64 year old gentleman was admitted in the hospital with progressive renal failure.  Patient has persistent nausea and vomiting since last chemotherapy  was given.intravenous fluids on Friday but according to patient he could not take anything orally because of persistent nausea.  Serum creatinine went up more than 5 and BUN more than 100.  This was admitted in the hospital and was started on IV fluid  and NG tube.  Had a CT scan done.  Patient also had a rising CEA and PET scan was scheduled as outpatient. creatinine improved rapidly to 2.9 after intravenous fluid pediatrician also started passing gas.    Review of Systems:  General: weakness  fatigue  Performance Status (ECOG): 1  HEENT: no complaints  Lungs: SOB  Cardiac: no complaints  GI: nausea  vomiting  constipation  GU: no complaints  Musculoskeletal: muscle ache  Extremities: no complaints  Skin: no complaints  Neuro: no complaints  Endocrine: no complaints  Psych: anxiety  Emotional well-being: Distress  Review of Systems: All other systems were reviewed and found to be negative   Allergies:  No Known Allergies:   Significant History/PMH:   anemia:    colon cancer-11/05/2012:    hypertension:    colectomy-laparoscopic rt, distal transvers colectomy:    left toe amputation:   Preventive Screening:  Has patient had any of the following test? Colonscopy  Prostate Exam   Last Colonoscopy: July 2014   Last Prostate Exam: July 2014   Smoking History: Smoking History Packs per day.  PFSH: Family History: For 40 years  Comments: According to patient he has stopped smoking since last hospitalization and not going to smoke.  Social History: negative alcohol  Additional Past Medical and Surgical History: Grandfather had colon cancer.  Uncle had lung cancer.   Home Medications: Medication Instructions Last Modified Date/Time  ondansetron 4 mg oral tablet 1 tab(s) orally every 4 to 6 hours, As Needed - for Nausea, Vomiting 30-Mar-15 10:27  amLODIPine 10 mg oral tablet 1 tab(s) orally once a day 30-Mar-15 10:27  Ensure 1  orally once a day, As Needed 30-Mar-15 10:27  Vital Signs:  :: Ht(CM): 185 Wt(KG): 62.6 BSA: 1.8 Temp: 96.9 Pulse: 119 RR: 18  BP: 115/79   Physical Exam:  General: patient is alert and oriented  Mental Status: patient slightly apprehensive but not in any acute distress  Head, Ears,  Nose,Throat: Examination of head: Normocephalic      Examination of  throat and mouth: No evidence of stomatitis  Neck, Thyroid: dry mucous membrane  Respiratory: Examination of chest: Air entry equal on both sides.  Emphysematous chest   Rhonchi: Not present   No wheezing    no rales  Cardiovascular: No murmur.  Heart sounds are within normal limit   No evidence of pericardial rub  Gastrointestinal: Abdomen: Soft   Liver not palpable   Spleen not palpable   No tenderness  bowel sounds are diminished   No ascites   Bowel sounds are present  Skin: poor skin turgor  Neurological: Higher functions have been normal limit.      Cranial nerves are intact   Motor system no evidence of localizing weakness   Sensory system no evidence of localizing weakness   No evidence of peripheral neuropathy  Lymphatics: no cervical, axillary, or inguinal lymphadenopathy   Laboratory Results: Thyroid:  31-Mar-15 05:06   Thyroid Stimulating Hormone  0.206 (0.45-4.50 (International Unit)  ----------------------- Pregnant patients have  different reference  ranges for TSH:  - - - - - - - - - -  Pregnant, first trimetser:  0.36 - 2.50 uIU/mL)  Routine Chem:  31-Mar-15 05:06   Lipase  1034 (Result(s) reported on 28 Jun 2013 at [facility].)  Result Comment LABS - This specimen was collected through an   - indwelling catheter or arterial line.  - A minimum of 75ms of blood was wasted prior    - to collecting the sample.  Interpret  - results with caution. PLATELETS - PLATELET CLUMPING IN SPECIMEN. INSTRUMENT  - COUNT CANNOT BE REPORTED. SMEAR REVIEW  - INDICATES THE PLATELET COUNT APPEARS   - LOW (LESS THAN 140,000).  Result(s) reported on 28 Jun 2013 at 08:23AM.  Result Comment LABS - This specimen was collected through an   - indwelling catheter or arterial line.  - A minimum of 53m of blood was wasted prior    - to collecting the sample.  Interpret  - results with caution.  Result(s)  reported on 28 Jun 2013 at 07:01AM.  Glucose, Serum  138  BUN  94  Creatinine (comp)  2.99  Sodium, Serum  130  Potassium, Serum  3.0  Chloride, Serum  89  CO2, Serum  33  Calcium (Total), Serum  8.2  Anion Gap 8  Osmolality (calc) 292  eGFR (African American)  25  eGFR (Non-African American)  22 (eGFR values <6058min/1.73 m2 may be an indication of chronic kidney disease (CKD). Calculated eGFR is useful in patients with stable renal function. The eGFR calculation will not be reliable in acutely ill patients when serum creatinine is changing rapidly. It is not useful in  patients on dialysis. The eGFR calculation may not be applicable to patients at the low and high extremes of body sizes, pregnant women, and vegetarians.)  Magnesium, Serum  3.2 (1.8-2.4 THERAPEUTIC RANGE: 4-7 mg/dL TOXIC: > 10 mg/dL  -----------------------)  Hemoglobin A1c ([facility])  6.6 (The American Diabetes Association recommends that a primary goal of therapy should be <7% and that physicians should reevaluate the treatment regimen in patients with HbA1c values consistently >8%.)  Cholesterol, Serum 178  Triglycerides, Serum 122  HDL (INHOUSE) 54  VLDL Cholesterol Calculated 24  LDL Cholesterol Calculated 100 (Result(s) reported on 28 Jun 2013 at 06:12AM.)  Phosphorus, Serum 4.7 (Result(s) reported on 28 Jun 2013 at [facility].)  Routine Hem:  31-Mar-15 05:06   WBC (CBC)  14.6  RBC (CBC) 4.97  Hemoglobin (CBC) 15.9  Hematocrit (CBC) 47.4  Platelet Count (CBC) See comment  MCV 95  MCH 32.0  MCHC 33.5  RDW  14.8  Neutrophil % 88.1  Lymphocyte % 6.5  Monocyte % 5.1  Eosinophil % 0.0  Basophil % 0.3  Neutrophil #  12.9  Lymphocyte #  0.9  Monocyte # 0.7  Eosinophil # 0.0  Basophil # 0.0   Lab Results Review:  Lab Results   LAST 5 Results: CEA: 13.4 (H): 10.7 (H)   Radiology Results: XRay:    31-Mar-15 10:11, Abdomen Flat and Erect  Abdomen Flat and Erect   REASON FOR EXAM:     sbo  COMMENTS:       PROCEDURE: DXR - DXR ABDOMEN 2 V FLAT AND ERECT  - Jun 28 2013 10:11AM     CLINICAL DATA:  Small bowel obstruction by history    EXAM:  ABDOMEN - 2 VIEW    COMPARISON:  CT ABD-PELV W/O CM dated 06/27/2013    FINDINGS:  The stomach is distended with gas. The proximal port of the  esophagogastric tube lies at or just above the GE junction. The  small and large bowel gas and stool pattern within the limits of  normal. There is surgical suture in the left upperquadrant of the  abdomen and in the right mid abdomen. There are degenerative changes  of the L4-L5 disc level with lateral osteophytes and levoscoliosis  centered here.     IMPRESSION:  1. There is no evidence of a small or large bowel obstruction.  2. There is gaseous distention of the stomach. Positioning of the  esophagogastric tube is suboptimal and advancement by 10 cm is  recommended.      Electronically Signed    By: David  Martinique    On: 06/28/2013 10:21         Verified By: DAVID A. [place], M.D., MD  LabUnknown:    30-Mar-15 17:01, CT Abdomen and Pelvis Without Contrast  PACS Image     31-Mar-15 10:11, Abdomen Flat and Erect  PACS Image   CT:    30-Mar-15 17:01, CT Abdomen and Pelvis Without Contrast  CT Abdomen and Pelvis Without Contrast   REASON FOR EXAM:    (1) aki/ nausea and vomit/colon ca; (2) same  COMMENTS:       PROCEDURE: CT  - CT ABDOMEN AND PELVIS W0  - Jun 27 2013  5:01PM     CLINICAL DATA:  Nausea, vomiting, history of colon cancer    EXAM:  CT ABDOMEN AND PELVIS WITHOUT CONTRAST    TECHNIQUE:  Multidetector CT imaging of the abdomen and pelvis was performed  following the standard protocol without intravenous contrast.    COMPARISON:  None available  FINDINGS:  A 1.1 x 3.1 cm subcutaneous hypodense nodule is partiallyvisualized  within the anterior left chest wall (series 2, image 1).    Scattered patchy and linear opacities within the partially  visualized  right middle lobe are noted, which may represent sequelae  of underlying infectious pneumonitis.    Limitednoncontrast evaluation of the liver is unremarkable. No  focal intrahepatic lesions. Gallbladder is within normal limits. No  biliary ductal dilatation. The spleen, adrenal glands, and pancreas  demonstrate a normal unenhanced appearance.    The kidneys are equal in size without evidence of nephrolithiasis or  hydronephrosis.  Stomach is distended with fluid and debris present within the  gastric lumen. Sequelae of prior right hemicolectomy is seen.  Multiple mildly dilated fluid-filled loops of small bowel measuring  up to 3.6 cm in diameter are seen within the mid abdomen. There is  an apparent transition point within the lower mid abdomen (series 2,  image 47). Findings are most compatible with mechanical small bowel  obstruction. The small bowel distally as well as the colon are  decompressed.    No free air or fluid. No enlarged intra-abdominal pelvic lymph  nodes. Moderate calcified atheromatous disease seen within the  intra-abdominal aorta and its branch vessels.    Bladder is decompressed with a Foley catheter in place. Prostate is  normal.  No acute osseous abnormality. No worrisome lytic or blastic osseous  lesions.     IMPRESSION:  1. Findings concerning for small bowel obstruction with transition  point in the lower mid abdomen. No underlying mass lesion seen at  this site, suggesting at this may be related to adhesive disease.  2. Ill-defined patchy opacities within the right middle lobe,  incompletely evaluated on this exam. While these findings may be  related to atelectatic changes, possible infectious pneumonitis  could also be considered in the correct clinical setting.  3. Hypodense subcutaneous nodule partially visualized within the  lower anterior left chest wall. Finding is indeterminate, and may  represent a sebaceous cyst. Correlation with physical  exam  recommended.  Electronically Signed    By: Jeannine Boga M.D.    On: 06/27/2013 17:12         Verified By: Neomia Glass, M.D.,   Assessment and Plan: Impression:   1. Carcinoma of cecum. Plan:   1. Carcinoma of Cecum. T3a N0 M0 tumor moderately differentiated, stage IIa disease. Baseline postop CEA has been obtained,  renal failure or most likely secondary to dehydration Persistent nausea vomiting possibility of small bowel obstruction secondary to adhesion cannot be ruled out.is responding to the conservative therapy with NG tube suction.  Now passing gas .Serum creatinine is improved of removing NG tube and starting patient on liquid dietIV fluidalso has a rising CEA and we will proceed with PET scan in the next 48 hours to rule out progressive disease  CC Referral:  cc: Dr. Festus Holts   Electronic Signatures: Oliva Bustard, Martie Lee (MD)  (Signed 31-Mar-15 13:42)  Authored: Note Type, History of Present Illness, CC/HPI, Review of Systems, ALLERGIES, PAST MEDICAL HISTORY, Preventive Screening, Smoking Cessation, Patient Family Social History, HOME MEDICATIONS, Vital Signs, Physical Exam, Lab Results Review, Rad Results Review, Assessment and Plan, CC Referring Physician   Last Updated: 31-Mar-15 13:42 by Jobe Gibbon (MD)

## 2014-07-22 NOTE — H&P (Signed)
PATIENT NAME:  William Ford, William Ford MR#:  [id number] DATE OF BIRTH:  May 24, 1950  DATE OF ADMISSION:  06/27/2013  ADDENDUM:  The patient had a CT scan and the CT scan has new mass which is located at the level of mid abdomen. Findings are concerning for small bowel obstruction with transition point in the lower mid abdomen. No underlying mass is seen which could be secondary to adhesions. The patient has ill-defined patchy opacities in the right middle lobe, incompletely evaluated, and atelectasis possible. Surgical consult obtained.  The patient to be changed to n.p.o. The case discussed with Dr. Marina Gravel.      ____________________________ [place] Sink, MD rsg:cs D: 06/27/2013 18:02:08 ET T: 06/27/2013 18:16:36 ET JOB#: [id number]  cc: [place] Sink, MD, <Dictator> Trino Higinbotham America Brown MD ELECTRONICALLY SIGNED 07/05/2013 21:29

## 2014-07-22 NOTE — Consult Note (Signed)
PATIENT NAME:  William Ford, William Ford MR#:  [id number] DATE OF BIRTH:  [birth year]  DATE OF CONSULTATION:  06/27/2013  REFERRING PHYSICIAN:   CONSULTING PHYSICIAN:  Nilay Mangrum Lilian Kapur, MD  REASON FOR CONSULTATION:  Acute renal failure.   HISTORY OF PRESENT ILLNESS: The patient is a very pleasant 64 year old African American male with past medical history of colon cancer, hypertension, anemia of chronic disease, osteoarthritis, hypokalemia, history of syncope who presented to [facility] today with nausea, vomiting and weakness. The patient reports that he has been undergoing chemotherapy for his colon cancer. He has apparently been on the FOLFOX treatment regimen. Last Friday he received his last chemotherapy regimen. Thereafter he developed significant nausea and vomiting. For a period of at least 5 days after his chemotherapy he was unable to keep anything on his stomach. He had at least 3-4 episodes of nausea and vomiting per day. On 06/24/2013, labs were drawn. At that point in time, BUN was 40, creatinine was 1.58. When presents today he was found to have multiple metabolic derangements being BUN was now up to 103 and creatinine 5.09. Sodium was also low at 125. There appears to be an element of hemoconcentration as well as hemoglobin was up to 17.8. Urinalysis showed a specific gravity of 1.017. Less than 1 RBC per high-power field was noted and 2 WBCs per high-power field were noted.   PAST MEDICAL HISTORY:  1. Colon cancer.  2. Hypertension.  3. Anemia of chronic disease.  4. Osteoarthritis.  5. Chronic hypokalemia.  6. History of syncope.   PAST SURGICAL HISTORY:  1. Left foot surgery with toe amputation.  2. Right-sided colectomy.   ALLERGIES: No known drug allergies.   CURRENT INPATIENT MEDICATIONS: Include:   1. 0.9 normal saline with 20 mEq/L of potassium chloride at 125 mL/h.  2. PhosLo 1 capsule p.o. t.i.d. with meals.  3. Colace 100 mg p.o. b.i.d.  4. Heparin  5000 units subcutaneous q. 12 hours.  5. Sliding still insulin.  6. Zofran 4 mg IV q. 4 hours p.r.n. nausea or vomiting.  7. Protonix 40 mg p.o. daily.  8. Phenergan 6.25 to 12.5 mg IV q. 4 hours p.r.n.  9. Senna 1 tablet p.o. b.i.d.  10. Acetaminophen 650 mg p.o. q. 4 hours p.r.n.   SOCIAL HISTORY: The patient resides in [place]. He is divorced. He has no children. He used to work in [employer], agricultural. He continues to smoke 1-2 cigarettes per day. Denies alcohol or illicit drug use.   FAMILY HISTORY: Mother is alive and has history of hypertension. The patient's father died secondary to alcoholism.   REVIEW OF SYSTEMS:  CONSTITUTIONAL: The patient feels that he may have lost a bit of weight over the past several days. He also has significant malaise and fatigue.  EYES: Denies diplopia, blurry vision.  HEENT: Denies headaches, hearing loss, tinnitus, epistaxis.  CARDIOVASCULAR: Denies chest pain, palpitations, PND.  RESPIRATORY: Has a cough and intermittent shortness of breath. Denies hemoptysis.  GASTROINTESTINAL: Reports nausea and vomiting. Denies abdominal pain and denies hemoptysis or bright red blood per rectum.  GENITOURINARY: Denies frequency, urgency, or dysuria. The patient reports diminished urine output over the past several days.  ENDOCRINE: Denies polyuria or polydipsia.  HEMATOLOGIC AND LYMPHATIC: Denies easy bruisability, bleeding, or swollen lymph nodes.  INTEGUMENTARY: Denies skin rashes or lesions.  NEUROLOGIC: Denies focal extremity numbness, weakness or tingling but does have generalized weakness and lightheadedness.  PSYCHIATRIC: Denies depression or bipolar disorder.  PHYSICAL EXAMINATION:  VITAL SIGNS: Temperature 98.4, pulse 99, respirations 18, blood pressure 124/85, pulse oximetry 95%.  GENERAL: Reveals a slender African American male who appears his stated age, currently in no acute distress.  HEENT: Normocephalic, atraumatic. Extraocular  movements are intact. Pupils are equal, round and reactive to light. No scleral icterus. Conjunctivae are pink. No epistaxis noted. Gross hearing intact. Oral mucosa are dry.  NECK: Supple without JVD or lymphadenopathy.  LUNGS: Clear to auscultation bilaterally with normal respiratory effort.  HEART: S1, S2 regular rate and rhythm. No murmurs, rubs, or gallops appreciated.  ABDOMEN: Soft, nontender, nondistended. Bowel sounds positive. No rebound or guarding. No gross organomegaly appreciated.  EXTREMITIES: No cyanosis noted. There is significant clubbing noted. No lower extremity edema.  NEUROLOGIC: The patient is awake, alert, and oriented to time, person, and place. Strength is 5/5 in both upper and lower extremities.  SKIN: Warm and dry, slightly diminished skin turgor noted.  MUSCULOSKELETAL: No joint redness or tenderness appreciated.  PSYCHIATRIC: The patient with has an appropriate affect and appears to have good insight into his current illness.   LABORATORY DATA: Sodium 125, potassium 3.1, chloride 79, BUN 103, creatinine 5.09, glucose 164, phosphorus 10.3, magnesium 3.4. Lipase 673. Uric acid 17.1. Total protein 10, albumin 4.3, bilirubin 1.0, alkaline phosphatase 157, AST 85, ALT 122. CBC shows WBC 14.5, hemoglobin 17.8, hematocrit 54, platelets 97,000. Urinalysis shows less than 1 RBC per high-power field, 2 WBCs per high-power field. Urine protein 30 mg/dL.   CT scan of the abdomen and pelvis showed findings concerning for small bowel obstruction with transition point in the lower midabdomen. There were ill-defined patchy opacities in the right middle lobe. There is also a hypodense subcutaneous nodule partially visualized within the lower anterior left chest wall. Renal ultrasound showed mildly echogenic bilateral kidneys. Right kidney was 11.2 cm. Left kidney was 11.1 cm.   IMPRESSION: This is a 64 year old African American male with past medical history of colon cancer, hypertension,  anemia of chronic disease, chronic hyperkalemia, osteoarthritis, prior history of syncope who presented to [facility] with nausea, vomiting, acute renal failure and suspicion of possible small bowel obstruction.   PROBLEM LIST:  1. Acute renal failure, suspect acute tubular necrosis.  2. Hypokalemia.  3. Hyponatremia, likely hypovolemic in nature.  4. Hyperphosphatemia.  5. Hyperuricemia.  6. Possible small bowel obstruction.   PLAN: The patient presented to [facility] with protracted nausea and vomiting. He was unable to keep anything on his stomach for at least the past 5 days. A CT scan was performed and suggests a small bowel obstruction. We would recommend a surgical consultation for this. In regards to his acute renal failure, we suspect that he has acute tubular necrosis from protracted dehydration. BUN is currently 103 with a creatinine of 5.09. There is no urgent indication for dialysis; however, we did advise the patient that if renal function were to worsen and if he were found to have significant oliguria we may need to consider this. He has a chronic component of hypokalemia. Serum potassium currently is 3.1. Agree with IV fluid hydration supplemented with potassium chloride. We would certainly recommend avoiding nonsteroidal anti-inflammatories and any IV contrast at this time. Renal ultrasound was obtained and was negative for hydronephrosis. We also recommend serial monitoring of renal function. I would like to thank Dr. Laurin Coder for this kind referral. Further plan as the patient progresses.   ____________________________ Tama High, MD mnl:tc D: 06/27/2013 17:43:56  ET T: 06/27/2013 19:14:15 ET JOB#: [id number]  cc: Tama High, MD, <Dictator> Mariah Milling Leisa Gault MD ELECTRONICALLY SIGNED 06/28/2013 8:56

## 2014-07-22 NOTE — Consult Note (Signed)
PATIENT NAME:  William Ford, COVIELLO MR#:  [id number] DATE OF BIRTH:  Aug 25, 1950  DATE OF CONSULTATION:  06/27/2013  REFERRING PHYSICIAN:   CONSULTING PHYSICIAN:  Jerrol Banana. Burt Knack, MD  CHIEF COMPLAINT: Nausea, vomiting.   HISTORY OF PRESENT ILLNESS: This is a 64 year old male with a history of colon cancer, T3 lesion, with a history of a robotic right hemicolectomy and transverse colectomy who presents with several days of nausea and vomiting. His last bowel movement was  7 days ago. He has no abdominal pain and passes gas on occasion. He has been vomiting 3 or 4 times a day. His last emesis was early this afternoon. He has never had an episode like this before. We were asked to see the patient for consideration of this being a small bowel obstruction versus an ileus. He is currently undergoing chemotherapy.   PAST MEDICAL HISTORY: Colon cancer, hypertension, anemia.   PAST SURGICAL HISTORY: Robotic right colectomy by Dr. Pat Patrick and left foot surgery.   FAMILY HISTORY: Noncontributory.   SOCIAL HISTORY: The patient smokes less than a half pack of cigarettes per day. Does not drink alcohol.   MEDICATIONS: Multiple, see chart.   ALLERGIES: None.   REVIEW OF SYSTEMS: A 10-system review is performed and negative with the exception of that mentioned in the HPI.   PHYSICAL EXAMINATION:  VITAL SIGNS: Stable. He is afebrile. Heart rate irregular.  HEENT: Shows no scleral icterus.  NECK: No palpable neck nodes.  CHEST: Clear to auscultation.  CARDIAC: Regular rate and rhythm.  ABDOMEN: Soft, slightly distended, slightly tympanitic. Scars are noted. No hernia is noted. Abdomen is essentially nontender.  EXTREMITIES: Without edema.  NEUROLOGIC: Grossly intact.  INTEGUMENT: No jaundice.   LABORATORY VALUES: White blood cell count of 14, H and H of 17.8 and 54 with a platelet count 97,000. Creatinine 5 with a BUN of 100, potassium at 3.1, sodium of 125.   CT scan is personally reviewed suggesting  dilatation of small bowel and collapsed distal bowel. No free air.   ASSESSMENT AND PLAN: This is a patient with likely small bowel obstruction. He does have colon cancer. The question is is adhesive disease versus recurrent cancer. He is currently on chemotherapy. My plan would be to place a nasogastric tube to decompress the stomach and small bowel and re-examine this patient. I will discuss the patient with Dr. Marina Gravel, who will be seeing him in the morning and abdominal films been ordered.   Review of his orders suggest that Dr. Holley Raring is managing the patient's renal failure fluid boluses have been ordered.   ____________________________ Jerrol Banana Burt Knack, MD rec:lt D: 06/27/2013 23:17:01 ET T: 06/28/2013 04:18:02 ET JOB#: [id number]  cc: Jerrol Banana. Burt Knack, MD, <Dictator> Florene Glen MD ELECTRONICALLY SIGNED 06/28/2013 6:44

## 2014-07-22 NOTE — Op Note (Signed)
PATIENT NAME:  William Ford, William Ford MR#:  [id number] DATE OF BIRTH:  [birth year]  DATE OF PROCEDURE:  06/30/2013  PREOPERATIVE DIAGNOSIS: Bowel obstruction, possible vascular compromise, pneumatosis intestinalis.   POSTOPERATIVE DIAGNOSIS: Small bowel obstruction, adhesive. No evidence of compromised bowel.  PROCEDURE PERFORMED: Exploratory laparotomy with lysis of adhesion.  SURGEON: Sherri Rad, MD   ASSISTANT: None.   ANESTHESIA: General endotracheal.   FINDINGS: Two adhesive bands from the right colonic anastomosis to the small bowel mesentery requiring adhesiolysis and resulting in a proximal small bowel obstruction.   SPECIMENS: None.   ESTIMATED BLOOD LOSS: Minimal.   DESCRIPTION OF PROCEDURE: Informed consent, supine position, general endotracheal anesthesia. A-line was placed. Foley catheter was placed. The patient's abdomen was clipped of hair, prepped with ChloraPrep solution. Timeout was observed. The previous midline skin incision was opened sharply with scalpel and electrocautery and carried through musculofascial layers the length of the incision with sharp dissection and electrocautery. There was no ascites. No bloody fluid. Palpation of the liver demonstrated no evidence of metastatic disease. Nasogastric tube was confirmed in the gastric antrum and secured by anesthesia. Small bowel was run in its entirety. There were 2 adhesive bands across the proximal bowel involving the colonic anastomosis. This was taken down with sharp dissection. Sites were imbricated with 3-0 silk suture. Further adhesiolysis demonstrated no evidence of further bowel obstruction laterally along the abdominal wall.   Small bowel was returned into its placement in the abdomen. The omentum was then draped over the intestines. Lap and needle count was correct x 2. A large piece of Seprafilm was placed. The fascia was then closed with running #1 looped PDS. Skin was reapproximated with a skin stapler. Sterile  dressing was applied.    ____________________________ Jeannette How Marina Gravel, MD FACS mab:jcm D: 06/30/2013 18:33:25 ET T: 06/30/2013 23:11:12 ET JOB#: [id number]  cc: Elta Guadeloupe A. Marina Gravel, MD, <Dictator> Rusty Aus, MD Martie Lee. Oliva Bustard, MD Hortencia Conradi MD ELECTRONICALLY SIGNED 07/05/2013 13:19

## 2014-07-22 NOTE — Consult Note (Signed)
Brief Consult Note: Diagnosis: pSBO.   Patient was seen by consultant.   Consult note dictated.   Recommend further assessment or treatment.   Orders entered.   Comments: pSBO, colon cancer, on chemoTX. suggest NG tube and repeat films will discuss with Dr Marina Gravel.  Electronic Signatures: Florene Glen (MD)  (Signed 30-Mar-15 22:46)  Authored: Brief Consult Note   Last Updated: 30-Mar-15 22:46 by Florene Glen (MD)

## 2014-08-09 ENCOUNTER — Inpatient Hospital Stay: Payer: Medicaid Other

## 2014-09-20 ENCOUNTER — Inpatient Hospital Stay: Payer: Medicaid Other | Attending: Hematology and Oncology

## 2014-09-20 VITALS — BP 175/78 | HR 82 | Temp 97.4°F

## 2014-09-20 DIAGNOSIS — C180 Malignant neoplasm of cecum: Secondary | ICD-10-CM | POA: Insufficient documentation

## 2014-09-20 DIAGNOSIS — Z95828 Presence of other vascular implants and grafts: Secondary | ICD-10-CM

## 2014-09-20 MED ORDER — HEPARIN SOD (PORK) LOCK FLUSH 100 UNIT/ML IV SOLN
500.0000 [IU] | Freq: Once | INTRAVENOUS | Status: AC
  Administered 2014-09-20: 500 [IU] via INTRAVENOUS

## 2014-09-20 MED ORDER — SODIUM CHLORIDE 0.9 % IJ SOLN
10.0000 mL | INTRAMUSCULAR | Status: DC | PRN
  Administered 2014-09-20: 10 mL via INTRAVENOUS
  Filled 2014-09-20: qty 10

## 2014-10-03 ENCOUNTER — Other Ambulatory Visit: Payer: Medicaid Other

## 2014-10-03 ENCOUNTER — Other Ambulatory Visit: Payer: Self-pay

## 2014-10-03 ENCOUNTER — Ambulatory Visit: Payer: Medicaid Other

## 2014-10-03 DIAGNOSIS — C189 Malignant neoplasm of colon, unspecified: Secondary | ICD-10-CM

## 2014-10-04 ENCOUNTER — Ambulatory Visit: Payer: Medicaid Other | Admitting: Hematology and Oncology

## 2014-10-12 ENCOUNTER — Other Ambulatory Visit: Payer: Self-pay

## 2014-10-12 DIAGNOSIS — C189 Malignant neoplasm of colon, unspecified: Secondary | ICD-10-CM

## 2014-10-16 ENCOUNTER — Ambulatory Visit
Admission: RE | Admit: 2014-10-16 | Discharge: 2014-10-16 | Disposition: A | Discharge status: Home / Self Care | Payer: Medicaid Other | Source: Ambulatory Visit | Attending: Hematology and Oncology | Admitting: Hematology and Oncology

## 2014-10-18 ENCOUNTER — Ambulatory Visit: Payer: Medicaid Other | Admitting: Hematology and Oncology

## 2014-10-20 ENCOUNTER — Ambulatory Visit
Admission: RE | Admit: 2014-10-20 | Discharge: 2014-10-20 | Disposition: A | Discharge status: Home / Self Care | Payer: Medicaid Other | Source: Ambulatory Visit | Attending: Hematology and Oncology | Admitting: Hematology and Oncology

## 2014-10-20 DIAGNOSIS — J439 Emphysema, unspecified: Secondary | ICD-10-CM | POA: Insufficient documentation

## 2014-10-20 DIAGNOSIS — L988 Other specified disorders of the skin and subcutaneous tissue: Secondary | ICD-10-CM | POA: Diagnosis not present

## 2014-10-20 DIAGNOSIS — F17210 Nicotine dependence, cigarettes, uncomplicated: Secondary | ICD-10-CM | POA: Diagnosis not present

## 2014-10-20 DIAGNOSIS — J984 Other disorders of lung: Secondary | ICD-10-CM

## 2014-10-20 DIAGNOSIS — I2510 Atherosclerotic heart disease of native coronary artery without angina pectoris: Secondary | ICD-10-CM | POA: Insufficient documentation

## 2014-10-20 HISTORY — DX: Essential (primary) hypertension: I10

## 2014-10-25 ENCOUNTER — Inpatient Hospital Stay: Payer: Medicaid Other | Attending: Hematology and Oncology | Admitting: Hematology and Oncology

## 2014-10-25 ENCOUNTER — Encounter: Payer: Self-pay | Admitting: Hematology and Oncology

## 2014-10-25 ENCOUNTER — Inpatient Hospital Stay: Payer: Medicaid Other

## 2014-10-25 VITALS — BP 128/74 | HR 85 | Temp 96.3°F | Ht 73.0 in | Wt 163.1 lb

## 2014-10-25 DIAGNOSIS — C182 Malignant neoplasm of ascending colon: Secondary | ICD-10-CM

## 2014-10-25 DIAGNOSIS — F17210 Nicotine dependence, cigarettes, uncomplicated: Secondary | ICD-10-CM | POA: Diagnosis not present

## 2014-10-25 DIAGNOSIS — I2510 Atherosclerotic heart disease of native coronary artery without angina pectoris: Secondary | ICD-10-CM | POA: Insufficient documentation

## 2014-10-25 DIAGNOSIS — C180 Malignant neoplasm of cecum: Secondary | ICD-10-CM | POA: Diagnosis not present

## 2014-10-25 DIAGNOSIS — R918 Other nonspecific abnormal finding of lung field: Secondary | ICD-10-CM | POA: Diagnosis not present

## 2014-10-25 DIAGNOSIS — J449 Chronic obstructive pulmonary disease, unspecified: Secondary | ICD-10-CM | POA: Insufficient documentation

## 2014-10-25 DIAGNOSIS — C189 Malignant neoplasm of colon, unspecified: Secondary | ICD-10-CM

## 2014-10-25 DIAGNOSIS — I10 Essential (primary) hypertension: Secondary | ICD-10-CM | POA: Diagnosis not present

## 2014-10-25 DIAGNOSIS — Z9221 Personal history of antineoplastic chemotherapy: Secondary | ICD-10-CM | POA: Diagnosis not present

## 2014-10-25 DIAGNOSIS — R970 Elevated carcinoembryonic antigen [CEA]: Secondary | ICD-10-CM | POA: Diagnosis not present

## 2014-10-25 DIAGNOSIS — R911 Solitary pulmonary nodule: Secondary | ICD-10-CM | POA: Insufficient documentation

## 2014-10-25 DIAGNOSIS — Z79899 Other long term (current) drug therapy: Secondary | ICD-10-CM | POA: Insufficient documentation

## 2014-10-25 LAB — CBC WITH DIFFERENTIAL/PLATELET
Basophils Absolute: 0.2 10*3/uL — ABNORMAL HIGH (ref 0–0.1)
Basophils Relative: 2 %
Eosinophils Absolute: 0.1 10*3/uL (ref 0–0.7)
Eosinophils Relative: 1 %
HCT: 53.7 % — ABNORMAL HIGH (ref 40.0–52.0)
Hemoglobin: 18 g/dL (ref 13.0–18.0)
Lymphocytes Relative: 34 %
Lymphs Abs: 3.5 10*3/uL (ref 1.0–3.6)
MCH: 31.5 pg (ref 26.0–34.0)
MCHC: 33.6 g/dL (ref 32.0–36.0)
MCV: 93.8 fL (ref 80.0–100.0)
Monocytes Absolute: 0.6 10*3/uL (ref 0.2–1.0)
Monocytes Relative: 6 %
Neutro Abs: 5.9 10*3/uL (ref 1.4–6.5)
Neutrophils Relative %: 57 %
Platelets: 155 10*3/uL (ref 150–440)
RBC: 5.72 MIL/uL (ref 4.40–5.90)
RDW: 14.8 % — ABNORMAL HIGH (ref 11.5–14.5)
WBC: 10.3 10*3/uL (ref 3.8–10.6)

## 2014-10-25 LAB — COMPREHENSIVE METABOLIC PANEL
ALT: 14 U/L — ABNORMAL LOW (ref 17–63)
AST: 21 U/L (ref 15–41)
Albumin: 4.5 g/dL (ref 3.5–5.0)
Alkaline Phosphatase: 96 U/L (ref 38–126)
Anion gap: 9 (ref 5–15)
BUN: 13 mg/dL (ref 6–20)
CO2: 26 mmol/L (ref 22–32)
Calcium: 9.4 mg/dL (ref 8.9–10.3)
Chloride: 99 mmol/L — ABNORMAL LOW (ref 101–111)
Creatinine, Ser: 1.06 mg/dL (ref 0.61–1.24)
GFR calc Af Amer: >60 mL/min (ref >60)
GFR calc non Af Amer: >60 mL/min (ref >60)
Glucose, Bld: 98 mg/dL (ref 65–99)
Potassium: 4.2 mmol/L (ref 3.5–5.1)
Sodium: 134 mmol/L — ABNORMAL LOW (ref 135–145)
Total Bilirubin: 0.7 mg/dL (ref 0.3–1.2)
Total Protein: 8.3 g/dL — ABNORMAL HIGH (ref 6.5–8.1)

## 2014-10-25 NOTE — Progress Notes (Signed)
Pt here today for follow up colon cancer; had CT chest last week on July 2oth;  Here for results; offers no complaints today

## 2014-10-26 LAB — CEA: CEA: 9.8 ng/mL — ABNORMAL HIGH (ref 0.0–4.7)

## 2014-11-01 ENCOUNTER — Inpatient Hospital Stay: Payer: Medicaid Other

## 2014-11-03 ENCOUNTER — Inpatient Hospital Stay: Payer: Medicaid Other | Attending: Hematology and Oncology

## 2014-11-03 DIAGNOSIS — Z95828 Presence of other vascular implants and grafts: Secondary | ICD-10-CM

## 2014-11-03 DIAGNOSIS — Z85038 Personal history of other malignant neoplasm of large intestine: Secondary | ICD-10-CM | POA: Diagnosis present

## 2014-11-03 DIAGNOSIS — Z451 Encounter for adjustment and management of infusion pump: Secondary | ICD-10-CM | POA: Diagnosis not present

## 2014-11-03 MED ORDER — SODIUM CHLORIDE 0.9 % IJ SOLN
10.0000 mL | INTRAMUSCULAR | Status: DC | PRN
  Administered 2014-11-03: 10 mL via INTRAVENOUS
  Filled 2014-11-03: qty 10

## 2014-11-03 MED ORDER — HEPARIN SOD (PORK) LOCK FLUSH 100 UNIT/ML IV SOLN
500.0000 [IU] | Freq: Once | INTRAVENOUS | Status: AC
  Administered 2014-11-03: 500 [IU] via INTRAVENOUS

## 2014-11-03 MED ORDER — HEPARIN SOD (PORK) LOCK FLUSH 100 UNIT/ML IV SOLN
INTRAVENOUS | Status: AC
  Filled 2014-11-03: qty 5

## 2014-12-01 ENCOUNTER — Other Ambulatory Visit: Payer: Self-pay | Admitting: Oncology

## 2014-12-05 NOTE — Telephone Encounter (Signed)
  Did this get called in?  M

## 2014-12-13 ENCOUNTER — Inpatient Hospital Stay: Payer: Medicaid Other | Attending: Hematology and Oncology

## 2014-12-13 DIAGNOSIS — Z95828 Presence of other vascular implants and grafts: Secondary | ICD-10-CM

## 2014-12-13 DIAGNOSIS — Z452 Encounter for adjustment and management of vascular access device: Secondary | ICD-10-CM | POA: Diagnosis not present

## 2014-12-13 DIAGNOSIS — C180 Malignant neoplasm of cecum: Secondary | ICD-10-CM | POA: Insufficient documentation

## 2014-12-13 MED ORDER — HEPARIN SOD (PORK) LOCK FLUSH 100 UNIT/ML IV SOLN
500.0000 [IU] | Freq: Once | INTRAVENOUS | Status: AC
  Administered 2014-12-13: 500 [IU] via INTRAVENOUS

## 2014-12-13 MED ORDER — SODIUM CHLORIDE 0.9 % IJ SOLN
10.0000 mL | INTRAMUSCULAR | Status: AC | PRN
  Administered 2014-12-13: 10 mL via INTRAVENOUS
  Filled 2014-12-13: qty 10

## 2014-12-13 MED ORDER — HEPARIN SOD (PORK) LOCK FLUSH 100 UNIT/ML IV SOLN
INTRAVENOUS | Status: AC
  Filled 2014-12-13: qty 5

## 2015-01-24 ENCOUNTER — Inpatient Hospital Stay: Payer: Medicaid Other | Attending: Hematology and Oncology

## 2015-01-24 VITALS — BP 134/73 | HR 77 | Temp 97.3°F

## 2015-01-24 DIAGNOSIS — Z452 Encounter for adjustment and management of vascular access device: Secondary | ICD-10-CM | POA: Insufficient documentation

## 2015-01-24 DIAGNOSIS — C180 Malignant neoplasm of cecum: Secondary | ICD-10-CM | POA: Insufficient documentation

## 2015-01-24 DIAGNOSIS — Z95828 Presence of other vascular implants and grafts: Secondary | ICD-10-CM

## 2015-01-24 MED ORDER — HEPARIN SOD (PORK) LOCK FLUSH 100 UNIT/ML IV SOLN
500.0000 [IU] | Freq: Once | INTRAVENOUS | Status: AC
  Administered 2015-01-24: 500 [IU] via INTRAVENOUS

## 2015-03-07 ENCOUNTER — Inpatient Hospital Stay: Payer: Medicare Other | Attending: Hematology and Oncology

## 2015-03-07 DIAGNOSIS — Z95828 Presence of other vascular implants and grafts: Secondary | ICD-10-CM

## 2015-03-07 DIAGNOSIS — C180 Malignant neoplasm of cecum: Secondary | ICD-10-CM | POA: Insufficient documentation

## 2015-03-07 DIAGNOSIS — Z452 Encounter for adjustment and management of vascular access device: Secondary | ICD-10-CM | POA: Diagnosis not present

## 2015-03-07 MED ORDER — HEPARIN SOD (PORK) LOCK FLUSH 100 UNIT/ML IV SOLN
500.0000 [IU] | Freq: Once | INTRAVENOUS | Status: AC
  Administered 2015-03-07: 500 [IU] via INTRAVENOUS
  Filled 2015-03-07: qty 5

## 2015-03-07 MED ORDER — SODIUM CHLORIDE 0.9 % IJ SOLN
10.0000 mL | INTRAMUSCULAR | Status: DC | PRN
  Administered 2015-03-07: 10 mL via INTRAVENOUS
  Filled 2015-03-07: qty 10

## 2015-03-08 NOTE — Progress Notes (Signed)
[facility]-  Cancer Center  Clinic day:  10/25/2014  Chief Complaint: William Ford is a 64 y.o. male with stage IIB colon cancer who is seen for 3 month assessment and review of interval chest CT scan.  HPI: The patient was last seen in the medical oncology clinic on 07/05/2014.  At that time, interval CT scans were reviewed.  Chest, abdomen, and pelvic CT scan on 07/03/2014 revealed no evidence of metastatic disease.  There was a new irregular shaped pleural based pulmonary nodule in the periphery of the RUL most likely infectious/inflammatory. 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  I encouraged smoking cessation.    Chest CT on 10/20/2014 revealed no change in small pleural-based area of nodularity in the right upper lobe, again favored to represent a benign area of scarring. Continued attention on future follow-up chest CT was recommended in 9 months.  There was mild diffuse bronchial wall thickening with moderate centrilobular and mild paraseptal emphysema; imaging findings suggestive of underlying COPD.  There was atherosclerosis, including left anterior descending coronary artery disease. There was a 3.1 x 1.3 cm subcutaneous lesion in the lower left chest wall anteriorly (stable), favored to represent a sebaceous cyst.  Symptomatically, he denies any complaints.  He notes "the same 'ole, same 'ole".  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  He denies any respiratory symptoms.  He is still trying to quit smoking.  Past Medical History  Diagnosis Date  . Cancer   . Hypertension     Past Surgical History  Procedure Laterality Date  . Colon surgery  2013    Family History  Problem Relation Age of Onset  . Cancer Maternal Grandfather     Social History:  reports that he has been smoking.  He has never used smokeless tobacco. He reports that he does not drink alcohol or use illicit drugs.  The patient is alone today.  Allergies: Allergies no known  allergies  Current Medications: Current Outpatient Prescriptions  Medication Sig Dispense Refill  . amLODipine-benazepril (LOTREL) 10-40 MG per capsule   0  . CELEBREX 200 MG capsule   0  . L-Methylfolate-Algae-B12-B6 (FOLTANX RF) 3-90.314-2-35 MG CAPS   0  . LYRICA 75 MG capsule take 1 capsule by mouth twice a day 60 capsule 3   No current facility-administered medications for this visit.   Facility-Administered Medications Ordered in Other Visits  Medication Dose Route Frequency Provider Last Rate Last Dose  . sodium chloride 0.9 % injection 10 mL  10 mL Intravenous PRN Melissa C Corcoran, MD   10 mL at 12/13/14 1043    Review of Systems:  GENERAL:  Feels good.  Active.  No fevers, sweats or weight loss. PERFORMANCE STATUS (ECOG):  0 HEENT:  No visual changes, runny nose, sore throat, mouth sores or tenderness. Lungs: No shortness of breath or cough.  No hemoptysis. Cardiac:  No chest pain, palpitations, orthopnea, or PND. GI:  No nausea, vomiting, diarrhea, constipation, melena or hematochezia. GU:  No urgency, frequency, dysuria, or hematuria. Musculoskeletal:  No back pain.  No joint pain.  No muscle tenderness. Extremities:  No pain or swelling. Skin:  No rashes or skin changes. Neuro:  Oxaliplatin neuropathy.  No headache, weakness, balance or coordination issues. Endocrine:  No diabetes, thyroid issues, hot flashes or night sweats. Psych:  No mood changes, depression or anxiety. Pain:  No focal pain. Review of systems:  All other systems reviewed and found to be negative.  Physical Exam: Blood   pressure 128/74, pulse 85, temperature 96.3 F (35.7 C), temperature source Tympanic, height 6' 1" (1.854 m), weight 163 lb 2.3 oz (74 kg). GENERAL:  Well developed, well nourished, sitting comfortably in the exam room in no acute distress. MENTAL STATUS:  Alert and oriented to person, place and time. HEAD:  Alopecia.  Gray mustache.  Normocephalic, atraumatic, face symmetric, no  Cushingoid features. EYES:  Glasses.  Brown eyes.  Pupils equal round and reactive to light and accomodation.  No conjunctivitis or scleral icterus. ENT:  Oropharynx clear without lesion.  Tongue normal. Mucous membranes moist.  RESPIRATORY:  Clear to auscultation without rales, wheezes or rhonchi. CARDIOVASCULAR:  Regular rate and rhythm without murmur, rub or gallop. ABDOMEN:  Soft, non-tender, with active bowel sounds, and no hepatosplenomegaly.  No masses. SKIN:  No rashes, ulcers or lesions. EXTREMITIES: No edema, no skin discoloration or tenderness.  No palpable cords. LYMPH NODES: No palpable cervical, supraclavicular, axillary or inguinal adenopathy  NEUROLOGICAL: Unremarkable. PSYCH:  Appropriate.  Appointment on 10/25/2014  Component Date Value Ref Range Status  . WBC 10/25/2014 10.3  3.8 - 10.6 K/uL Final  . RBC 10/25/2014 5.72  4.40 - 5.90 MIL/uL Final  . Hemoglobin 10/25/2014 18.0  13.0 - 18.0 g/dL Final  . HCT 10/25/2014 53.7* 40.0 - 52.0 % Final  . MCV 10/25/2014 93.8  80.0 - 100.0 fL Final  . MCH 10/25/2014 31.5  26.0 - 34.0 pg Final  . MCHC 10/25/2014 33.6  32.0 - 36.0 g/dL Final  . RDW 10/25/2014 14.8* 11.5 - 14.5 % Final  . Platelets 10/25/2014 155  150 - 440 K/uL Final  . Neutrophils Relative % 10/25/2014 57%   Final  . Neutro Abs 10/25/2014 5.9  1.4 - 6.5 K/uL Final  . Lymphocytes Relative 10/25/2014 34%   Final  . Lymphs Abs 10/25/2014 3.5  1.0 - 3.6 K/uL Final  . Monocytes Relative 10/25/2014 6%   Final  . Monocytes Absolute 10/25/2014 0.6  0.2 - 1.0 K/uL Final  . Eosinophils Relative 10/25/2014 1%   Final  . Eosinophils Absolute 10/25/2014 0.1  0 - 0.7 K/uL Final  . Basophils Relative 10/25/2014 2%   Final  . Basophils Absolute 10/25/2014 0.2* 0 - 0.1 K/uL Final  . Sodium 10/25/2014 134* 135 - 145 mmol/L Final  . Potassium 10/25/2014 4.2  3.5 - 5.1 mmol/L Final  . Chloride 10/25/2014 99* 101 - 111 mmol/L Final  . CO2 10/25/2014 26  22 - 32 mmol/L Final  .  Glucose, Bld 10/25/2014 98  65 - 99 mg/dL Final  . BUN 10/25/2014 13  6 - 20 mg/dL Final  . Creatinine, Ser 10/25/2014 1.06  0.61 - 1.24 mg/dL Final  . Calcium 10/25/2014 9.4  8.9 - 10.3 mg/dL Final  . Total Protein 10/25/2014 8.3* 6.5 - 8.1 g/dL Final  . Albumin 10/25/2014 4.5  3.5 - 5.0 g/dL Final  . AST 10/25/2014 21  15 - 41 U/L Final  . ALT 10/25/2014 14* 17 - 63 U/L Final  . Alkaline Phosphatase 10/25/2014 96  38 - 126 U/L Final  . Total Bilirubin 10/25/2014 0.7  0.3 - 1.2 mg/dL Final  . GFR calc non Af Amer 10/25/2014 >60  >60 mL/min Final  . GFR calc Af Amer 10/25/2014 >60  >60 mL/min Final   Comment: (NOTE) The eGFR has been calculated using the CKD EPI equation. This calculation has not been validated in all clinical situations. eGFR's persistently <60 mL/min signify possible Chronic Kidney Disease.   .   Anion gap 10/25/2014 9  5 - 15 Final  . CEA 10/25/2014 9.8* 0.0 - 4.7 ng/mL Final   Comment: (NOTE)       Roche ECLIA methodology       Nonsmokers  <3.9                                     Smokers     <5.6 Performed At: [facility] [address] [place], [place] [id number] [place] William F MD Ph:[phone]     Assessment:  William Ford is a 64 y.o. male with stage IIB colon cancer.  He underwent right colectomy on 11/05/2012.  Pathology revealed a T3N0M0 tumor in the cecum with 17 negative lymph nodes.  Circumferential margin was positive.  There was lymphovascular invasion. He had 1 deposit of metastases in peritoneum.  Mismatch repair was positive.  He received 12 cycles of FOLFOX chemotherapy from 12/31/2012 - 08/19/2013. Chemotherapy was interrupted because of small bowel obstruction due to adhesions in 05/2013.  He has had an elevated CEA with negative imaging studies.  CEA was 9.6 on 05/17/2014 and 9.8 on 10/25/2014.  Last colonoscopy was in 2015.   Chest, abdomen, and pelvic CT scan on 07/03/2014 revealed no evidence of metastatic disease.  There was a  new irregular shaped pleural based pulmonary nodule in the periphery of the RUL most likely infectious/inflammatory. 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  Chest CT on 10/20/2014 revealed no change in small pleural-based area of nodularity in the right upper lobe, favored to represent a benign area of scarring. There was mild diffuse bronchial wall thickening with moderate centrilobular and mild paraseptal emphysema; imaging findings suggestive of underlying COPD.   Symptomatically, he is doing well.  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  He denies any melena or hematochezia.  He denies any respiratory symptoms.  He is trying to quit smoking.  Plan: 1. Labs today:  CBC with diff, CMP, CEA. 2. Review chest CT.  Plan follow-up in 9 months per radiology. 3. Discuss port removal. 4. Encourage smoking cessation. 5. Schedule chest CT on 07/21/2015 per radiology- f/u  peural based nodule. 6. RTC in 6 months for MD assessment and labs (CBC with diff, CMP, CEA).   Melissa C Corcoran, MD  10/25/2014 

## 2015-04-06 ENCOUNTER — Telehealth: Payer: Self-pay | Admitting: Hematology and Oncology

## 2015-04-06 ENCOUNTER — Other Ambulatory Visit: Payer: Self-pay | Admitting: Hematology and Oncology

## 2015-04-06 NOTE — Telephone Encounter (Signed)
  No electronic orders seen in EPIC from PCP.  M

## 2015-04-06 NOTE — Telephone Encounter (Signed)
Patient needs to go to primary care office, out patient [facility] or [facility] to have labs drawn from PCP. We are not set up here in the cancer center to perform out side labs per Ulice Dash.  Dr. Mike Gip aware.  I spoke with patient who is aware of the plan.

## 2015-04-06 NOTE — Telephone Encounter (Signed)
He said his pcp is supposed to have sent Rx for bloodwork for him to have drawn here. I haven't seen anything yet. Can you check to see if the orders have been entered electronically? I advised pt that as soon as we have the orders I will call him to schedule. Thanks.

## 2015-04-18 ENCOUNTER — Inpatient Hospital Stay: Payer: Medicare Other | Attending: Hematology and Oncology

## 2015-05-07 ENCOUNTER — Encounter: Payer: Self-pay | Admitting: Hematology and Oncology

## 2015-05-09 ENCOUNTER — Inpatient Hospital Stay: Payer: Medicaid Other | Attending: Internal Medicine

## 2015-05-09 ENCOUNTER — Encounter: Payer: Self-pay | Admitting: Family Medicine

## 2015-05-09 ENCOUNTER — Inpatient Hospital Stay (HOSPITAL_BASED_OUTPATIENT_CLINIC_OR_DEPARTMENT_OTHER): Payer: Medicaid Other | Admitting: Family Medicine

## 2015-05-09 VITALS — BP 105/70 | HR 81 | Temp 97.1°F | Resp 18 | Ht 73.0 in | Wt 162.7 lb

## 2015-05-09 DIAGNOSIS — F17210 Nicotine dependence, cigarettes, uncomplicated: Secondary | ICD-10-CM | POA: Insufficient documentation

## 2015-05-09 DIAGNOSIS — C182 Malignant neoplasm of ascending colon: Secondary | ICD-10-CM

## 2015-05-09 DIAGNOSIS — C180 Malignant neoplasm of cecum: Secondary | ICD-10-CM | POA: Insufficient documentation

## 2015-05-09 DIAGNOSIS — L723 Sebaceous cyst: Secondary | ICD-10-CM | POA: Diagnosis not present

## 2015-05-09 DIAGNOSIS — Z9221 Personal history of antineoplastic chemotherapy: Secondary | ICD-10-CM

## 2015-05-09 DIAGNOSIS — R918 Other nonspecific abnormal finding of lung field: Secondary | ICD-10-CM | POA: Diagnosis not present

## 2015-05-09 DIAGNOSIS — I2510 Atherosclerotic heart disease of native coronary artery without angina pectoris: Secondary | ICD-10-CM | POA: Diagnosis not present

## 2015-05-09 DIAGNOSIS — I10 Essential (primary) hypertension: Secondary | ICD-10-CM | POA: Diagnosis not present

## 2015-05-09 DIAGNOSIS — Z79899 Other long term (current) drug therapy: Secondary | ICD-10-CM

## 2015-05-09 DIAGNOSIS — G620 Drug-induced polyneuropathy: Secondary | ICD-10-CM | POA: Insufficient documentation

## 2015-05-09 LAB — COMPREHENSIVE METABOLIC PANEL
ALT: 15 U/L — ABNORMAL LOW (ref 17–63)
AST: 20 U/L (ref 15–41)
Albumin: 4.6 g/dL (ref 3.5–5.0)
Alkaline Phosphatase: 93 U/L (ref 38–126)
Anion gap: 6 (ref 5–15)
BUN: 13 mg/dL (ref 6–20)
CO2: 28 mmol/L (ref 22–32)
Calcium: 9.8 mg/dL (ref 8.9–10.3)
Chloride: 103 mmol/L (ref 101–111)
Creatinine, Ser: 1.16 mg/dL (ref 0.61–1.24)
GFR calc Af Amer: >60 mL/min (ref >60)
GFR calc non Af Amer: >60 mL/min (ref >60)
Glucose, Bld: 100 mg/dL — ABNORMAL HIGH (ref 65–99)
Potassium: 4.6 mmol/L (ref 3.5–5.1)
Sodium: 137 mmol/L (ref 135–145)
Total Bilirubin: 1.2 mg/dL (ref 0.3–1.2)
Total Protein: 8.2 g/dL — ABNORMAL HIGH (ref 6.5–8.1)

## 2015-05-09 LAB — CBC WITH DIFFERENTIAL/PLATELET
Basophils Absolute: 0.2 10*3/uL — ABNORMAL HIGH (ref 0–0.1)
Basophils Relative: 2 %
Eosinophils Absolute: 0.1 10*3/uL (ref 0–0.7)
Eosinophils Relative: 1 %
HCT: 55 % — ABNORMAL HIGH (ref 40.0–52.0)
Hemoglobin: 18.6 g/dL — ABNORMAL HIGH (ref 13.0–18.0)
Lymphocytes Relative: 29 %
Lymphs Abs: 3 10*3/uL (ref 1.0–3.6)
MCH: 32 pg (ref 26.0–34.0)
MCHC: 33.8 g/dL (ref 32.0–36.0)
MCV: 94.5 fL (ref 80.0–100.0)
Monocytes Absolute: 0.7 10*3/uL (ref 0.2–1.0)
Monocytes Relative: 7 %
Neutro Abs: 6.5 10*3/uL (ref 1.4–6.5)
Neutrophils Relative %: 61 %
Platelets: 170 10*3/uL (ref 150–440)
RBC: 5.82 MIL/uL (ref 4.40–5.90)
RDW: 14.2 % (ref 11.5–14.5)
WBC: 10.4 10*3/uL (ref 3.8–10.6)

## 2015-05-09 NOTE — Progress Notes (Signed)
[facility] day:  10/25/2014  Chief Complaint: William Ford is a 65 y.o. male with stage IIB colon cancer who is seen for 3 month assessment and review of interval chest CT scan.  HPI: The patient was last seen in the medical oncology clinic on 07/05/2014.  At that time, interval CT scans were reviewed.  Chest, abdomen, and pelvic CT scan on 07/03/2014 revealed no evidence of metastatic disease.  There was a new irregular shaped pleural based pulmonary nodule in the periphery of the RUL most likely infectious/inflammatory. 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  I encouraged smoking cessation.    Chest CT on 10/20/2014 revealed no change in small pleural-based area of nodularity in the right upper lobe, again favored to represent a benign area of scarring. Continued attention on future follow-up chest CT was recommended in 9 months.  There was mild diffuse bronchial wall thickening with moderate centrilobular and mild paraseptal emphysema; imaging findings suggestive of underlying COPD.  There was atherosclerosis, including left anterior descending coronary artery disease. There was a 3.1 x 1.3 cm subcutaneous lesion in the lower left chest wall anteriorly (stable), favored to represent a sebaceous cyst.  Symptomatically, he denies any complaints.  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  He denies any respiratory symptoms.  He is still trying to quit smoking, he is down to a half a pack a day. He overall reports feeling very well, denies any melena, hematochezia, nausea, or vomiting.  Past Medical History  Diagnosis Date  . Cancer ([place])   . Hypertension     Past Surgical History  Procedure Laterality Date  . Colon surgery  2013    Family History  Problem Relation Age of Onset  . Cancer Maternal Grandfather     Social History:  reports that he has been smoking.  He has never used smokeless tobacco. He reports that he does not drink  alcohol or use illicit drugs.  The patient is alone today.  Allergies: No Known Allergies  Current Medications: Current Outpatient Prescriptions  Medication Sig Dispense Refill  . amLODipine-benazepril (LOTREL) 10-40 MG per capsule   0  . CELEBREX 200 MG capsule   0  . gabapentin (NEURONTIN) 300 MG capsule Take 1 capsule by mouth 3 (three) times daily.  0  . L-Methylfolate-Algae-B12-B6 (FOLTANX RF) 3-90.314-2-35 MG CAPS   0  . sildenafil (REVATIO) 20 MG tablet Take 2 tablets by mouth as needed.  0  . LYRICA 75 MG capsule take 1 capsule by mouth twice a day (Patient not taking: Reported on 05/09/2015) 60 capsule 3   No current facility-administered medications for this visit.   Facility-Administered Medications Ordered in Other Visits  Medication Dose Route Frequency Provider Last Rate Last Dose  . sodium chloride 0.9 % injection 10 mL  10 mL Intravenous PRN Lequita Asal, MD   10 mL at 12/13/14 1043    Review of Systems:  GENERAL:  Feels good.  Active.  No fevers, sweats or weight loss. PERFORMANCE STATUS (ECOG):  0 HEENT:  No visual changes, runny nose, sore throat, mouth sores or tenderness. Lungs: No shortness of breath or cough.  No hemoptysis. Cardiac:  No chest pain, palpitations, orthopnea, or PND. GI:  No nausea, vomiting, diarrhea, constipation, melena or hematochezia. GU:  No urgency, frequency, dysuria, or hematuria. Musculoskeletal:  No back pain.  No joint pain.  No muscle tenderness. Extremities:  No pain or swelling. Skin:  No rashes or  skin changes. Neuro:  Oxaliplatin neuropathy.  No headache, weakness, balance or coordination issues. Endocrine:  No diabetes, thyroid issues, hot flashes or night sweats. Psych:  No mood changes, depression or anxiety. Pain:  No focal pain. Review of systems:  All other systems reviewed and found to be negative.  Physical Exam: Blood pressure 105/70, pulse 81, temperature 97.1 F (36.2 C), resp. rate 18, height 6' 1"  (1.854  m), weight 162 lb 11.2 oz (73.8 kg), SpO2 96 %. GENERAL:  Well developed, well nourished, sitting comfortably in the exam room in no acute distress. MENTAL STATUS:  Alert and oriented to person, place and time. HEAD:  Alopecia.  Albertina Parr.  Normocephalic, atraumatic, face symmetric, no Cushingoid features. EYES:  Glasses.  Brown eyes.  Pupils equal round and reactive to light and accomodation.  No conjunctivitis or scleral icterus. ENT:  Oropharynx clear without lesion.  Tongue normal. Mucous membranes moist.  RESPIRATORY:  Clear to auscultation without rales, wheezes or rhonchi. CARDIOVASCULAR:  Regular rate and rhythm without murmur, rub or gallop. ABDOMEN:  Soft, non-tender, with active bowel sounds, and no hepatosplenomegaly.  No masses. SKIN:  No rashes, ulcers or lesions. EXTREMITIES: No edema, no skin discoloration or tenderness.  No palpable cords. LYMPH NODES: No palpable cervical, supraclavicular, axillary or inguinal adenopathy  NEUROLOGICAL: Unremarkable. PSYCH:  Appropriate.  Appointment on 05/09/2015  Component Date Value Ref Range Status  . WBC 05/09/2015 10.4  3.8 - 10.6 K/uL Final  . RBC 05/09/2015 5.82  4.40 - 5.90 MIL/uL Final  . Hemoglobin 05/09/2015 18.6* 13.0 - 18.0 g/dL Final  . HCT 05/09/2015 55.0* 40.0 - 52.0 % Final  . MCV 05/09/2015 94.5  80.0 - 100.0 fL Final  . MCH 05/09/2015 32.0  26.0 - 34.0 pg Final  . MCHC 05/09/2015 33.8  32.0 - 36.0 g/dL Final  . RDW 05/09/2015 14.2  11.5 - 14.5 % Final  . Platelets 05/09/2015 170  150 - 440 K/uL Final  . Neutrophils Relative % 05/09/2015 61%   Final  . Neutro Abs 05/09/2015 6.5  1.4 - 6.5 K/uL Final  . Lymphocytes Relative 05/09/2015 29%   Final  . Lymphs Abs 05/09/2015 3.0  1.0 - 3.6 K/uL Final  . Monocytes Relative 05/09/2015 7%   Final  . Monocytes Absolute 05/09/2015 0.7  0.2 - 1.0 K/uL Final  . Eosinophils Relative 05/09/2015 1%   Final  . Eosinophils Absolute 05/09/2015 0.1  0 - 0.7 K/uL Final  .  Basophils Relative 05/09/2015 2%   Final  . Basophils Absolute 05/09/2015 0.2* 0 - 0.1 K/uL Final  . Sodium 05/09/2015 137  135 - 145 mmol/L Final  . Potassium 05/09/2015 4.6  3.5 - 5.1 mmol/L Final  . Chloride 05/09/2015 103  101 - 111 mmol/L Final  . CO2 05/09/2015 28  22 - 32 mmol/L Final  . Glucose, Bld 05/09/2015 100* 65 - 99 mg/dL Final  . BUN 05/09/2015 13  6 - 20 mg/dL Final  . Creatinine, Ser 05/09/2015 1.16  0.61 - 1.24 mg/dL Final  . Calcium 05/09/2015 9.8  8.9 - 10.3 mg/dL Final  . Total Protein 05/09/2015 8.2* 6.5 - 8.1 g/dL Final  . Albumin 05/09/2015 4.6  3.5 - 5.0 g/dL Final  . AST 05/09/2015 20  15 - 41 U/L Final  . ALT 05/09/2015 15* 17 - 63 U/L Final  . Alkaline Phosphatase 05/09/2015 93  38 - 126 U/L Final  . Total Bilirubin 05/09/2015 1.2  0.3 - 1.2 mg/dL Final  .  GFR calc non Af Amer 05/09/2015 >60  >60 mL/min Final  . GFR calc Af Amer 05/09/2015 >60  >60 mL/min Final   Comment: (NOTE) The eGFR has been calculated using the CKD EPI equation. This calculation has not been validated in all clinical situations. eGFR's persistently <60 mL/min signify possible Chronic Kidney Disease.   Georgiann Hahn gap 05/09/2015 6  5 - 15 Final    Assessment:  Sai Zinn is a 64 y.o. male with stage IIB colon cancer.  He underwent right colectomy on 11/05/2012.  Pathology revealed a T3N0M0 tumor in the cecum with 17 negative lymph nodes.  Circumferential margin was positive.  There was lymphovascular invasion. He had 1 deposit of metastases in peritoneum.  Mismatch repair was positive.  He received 12 cycles of FOLFOX chemotherapy from 12/31/2012 - 08/19/2013. Chemotherapy was interrupted because of small bowel obstruction due to adhesions in 05/2013.  He has had an elevated CEA with negative imaging studies.  CEA was 9.6 on 05/17/2014 and 9.8 on 10/25/2014.  Last colonoscopy was in 2015.   Chest, abdomen, and pelvic CT scan on 07/03/2014 revealed no evidence of metastatic disease.   There was a new irregular shaped pleural based pulmonary nodule in the periphery of the RUL most likely infectious/inflammatory. 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  Chest CT on 10/20/2014 revealed no change in small pleural-based area of nodularity in the right upper lobe, favored to represent a benign area of scarring. There was mild diffuse bronchial wall thickening with moderate centrilobular and mild paraseptal emphysema; imaging findings suggestive of underlying COPD.   Symptomatically, he is doing well.  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  He denies any melena or hematochezia.  He denies any respiratory symptoms.   Plan: 1. Labs today:  CBC with diff, CMP, CEA. 2. CEA is elevated at 10. 3 but is gradually increasing. Also with elevated hemoglobin. 3. Encourage smoking cessation. 4. Schedule chest CT on 07/21/2015 per radiology- f/u  peural based nodule. 5. Discussed with Dr. Mike Gip and due to increasing CEA and hemoglobin she would like to follow up with this patient in 2 weeks.   Evlyn Kanner, NP 05/09/2015

## 2015-05-10 LAB — CEA: CEA: 10.3 ng/mL — ABNORMAL HIGH (ref 0.0–4.7)

## 2015-05-30 ENCOUNTER — Inpatient Hospital Stay: Payer: Medicare Other | Attending: Hematology and Oncology

## 2015-05-30 DIAGNOSIS — C180 Malignant neoplasm of cecum: Secondary | ICD-10-CM | POA: Insufficient documentation

## 2015-05-30 DIAGNOSIS — Z452 Encounter for adjustment and management of vascular access device: Secondary | ICD-10-CM | POA: Diagnosis not present

## 2015-06-15 ENCOUNTER — Inpatient Hospital Stay: Payer: Medicare Other

## 2015-06-15 VITALS — BP 103/89 | HR 89 | Temp 96.9°F

## 2015-06-15 DIAGNOSIS — C180 Malignant neoplasm of cecum: Secondary | ICD-10-CM | POA: Diagnosis not present

## 2015-06-15 DIAGNOSIS — Z95828 Presence of other vascular implants and grafts: Secondary | ICD-10-CM

## 2015-06-15 MED ORDER — HEPARIN SOD (PORK) LOCK FLUSH 100 UNIT/ML IV SOLN
500.0000 [IU] | Freq: Once | INTRAVENOUS | Status: AC
  Administered 2015-06-15: 500 [IU] via INTRAVENOUS

## 2015-06-15 MED ORDER — SODIUM CHLORIDE 0.9% FLUSH
10.0000 mL | INTRAVENOUS | Status: DC | PRN
  Administered 2015-06-15: 10 mL via INTRAVENOUS
  Filled 2015-06-15: qty 10

## 2015-07-11 ENCOUNTER — Inpatient Hospital Stay: Payer: Medicare Other | Attending: Hematology and Oncology

## 2015-07-11 VITALS — BP 120/69 | HR 89 | Temp 96.4°F | Resp 24

## 2015-07-11 DIAGNOSIS — C180 Malignant neoplasm of cecum: Secondary | ICD-10-CM | POA: Insufficient documentation

## 2015-07-11 DIAGNOSIS — Z452 Encounter for adjustment and management of vascular access device: Secondary | ICD-10-CM | POA: Insufficient documentation

## 2015-07-11 DIAGNOSIS — Z95828 Presence of other vascular implants and grafts: Secondary | ICD-10-CM

## 2015-07-11 MED ORDER — HEPARIN SOD (PORK) LOCK FLUSH 100 UNIT/ML IV SOLN
INTRAVENOUS | Status: AC
  Filled 2015-07-11: qty 5

## 2015-07-11 MED ORDER — HEPARIN SOD (PORK) LOCK FLUSH 100 UNIT/ML IV SOLN
500.0000 [IU] | Freq: Once | INTRAVENOUS | Status: AC
  Administered 2015-07-11: 500 [IU] via INTRAVENOUS

## 2015-07-11 MED ORDER — SODIUM CHLORIDE 0.9% FLUSH
10.0000 mL | INTRAVENOUS | Status: DC | PRN
  Administered 2015-07-11: 10 mL via INTRAVENOUS
  Filled 2015-07-11: qty 10

## 2015-07-19 ENCOUNTER — Ambulatory Visit: Payer: Medicaid Other

## 2015-07-19 ENCOUNTER — Ambulatory Visit
Admission: RE | Admit: 2015-07-19 | Discharge: 2015-07-19 | Disposition: A | Discharge status: Home / Self Care | Payer: Medicare Other | Source: Ambulatory Visit | Attending: Hematology and Oncology | Admitting: Hematology and Oncology

## 2015-07-19 DIAGNOSIS — J439 Emphysema, unspecified: Secondary | ICD-10-CM | POA: Diagnosis not present

## 2015-07-19 DIAGNOSIS — J432 Centrilobular emphysema: Secondary | ICD-10-CM | POA: Insufficient documentation

## 2015-07-19 DIAGNOSIS — R911 Solitary pulmonary nodule: Secondary | ICD-10-CM | POA: Diagnosis not present

## 2015-07-19 DIAGNOSIS — I700 Atherosclerosis of aorta: Secondary | ICD-10-CM | POA: Insufficient documentation

## 2015-07-19 DIAGNOSIS — I2510 Atherosclerotic heart disease of native coronary artery without angina pectoris: Secondary | ICD-10-CM | POA: Insufficient documentation

## 2015-07-19 DIAGNOSIS — E279 Disorder of adrenal gland, unspecified: Secondary | ICD-10-CM | POA: Diagnosis not present

## 2015-07-25 ENCOUNTER — Inpatient Hospital Stay (HOSPITAL_BASED_OUTPATIENT_CLINIC_OR_DEPARTMENT_OTHER): Payer: Medicare Other | Admitting: Hematology and Oncology

## 2015-07-25 ENCOUNTER — Encounter: Payer: Self-pay | Admitting: Hematology and Oncology

## 2015-07-25 ENCOUNTER — Inpatient Hospital Stay: Payer: Medicare Other

## 2015-07-25 VITALS — BP 134/84 | HR 89 | Temp 97.0°F | Wt 156.5 lb

## 2015-07-25 DIAGNOSIS — D751 Secondary polycythemia: Secondary | ICD-10-CM

## 2015-07-25 DIAGNOSIS — C180 Malignant neoplasm of cecum: Secondary | ICD-10-CM

## 2015-07-25 DIAGNOSIS — Z79899 Other long term (current) drug therapy: Secondary | ICD-10-CM

## 2015-07-25 DIAGNOSIS — R911 Solitary pulmonary nodule: Secondary | ICD-10-CM

## 2015-07-25 DIAGNOSIS — C182 Malignant neoplasm of ascending colon: Secondary | ICD-10-CM

## 2015-07-25 LAB — COMPREHENSIVE METABOLIC PANEL WITH GFR
ALT: 13 U/L — ABNORMAL LOW (ref 17–63)
AST: 24 U/L (ref 15–41)
Albumin: 4.8 g/dL (ref 3.5–5.0)
Alkaline Phosphatase: 97 U/L (ref 38–126)
Anion gap: 5 (ref 5–15)
BUN: 10 mg/dL (ref 6–20)
CO2: 26 mmol/L (ref 22–32)
Calcium: 9.5 mg/dL (ref 8.9–10.3)
Chloride: 105 mmol/L (ref 101–111)
Creatinine, Ser: 1.05 mg/dL (ref 0.61–1.24)
GFR calc Af Amer: >60 mL/min (ref >60)
GFR calc non Af Amer: >60 mL/min (ref >60)
Glucose, Bld: 100 mg/dL — ABNORMAL HIGH (ref 65–99)
Potassium: 4 mmol/L (ref 3.5–5.1)
Sodium: 136 mmol/L (ref 135–145)
Total Bilirubin: 1.1 mg/dL (ref 0.3–1.2)
Total Protein: 8.8 g/dL — ABNORMAL HIGH (ref 6.5–8.1)

## 2015-07-25 LAB — IRON AND TIBC
Iron: 88 ug/dL (ref 45–182)
Saturation Ratios: 24 % (ref 17.9–39.5)
TIBC: 372 ug/dL (ref 250–450)
UIBC: 284 ug/dL

## 2015-07-25 LAB — CBC WITH DIFFERENTIAL/PLATELET
Basophils Absolute: 0.2 10*3/uL — ABNORMAL HIGH (ref 0–0.1)
Basophils Relative: 2 %
Eosinophils Absolute: 0.1 10*3/uL (ref 0–0.7)
Eosinophils Relative: 1 %
HCT: 57.2 % — ABNORMAL HIGH (ref 40.0–52.0)
Hemoglobin: 19.2 g/dL — ABNORMAL HIGH (ref 13.0–18.0)
Lymphocytes Relative: 31 %
Lymphs Abs: 2.5 10*3/uL (ref 1.0–3.6)
MCH: 32 pg (ref 26.0–34.0)
MCHC: 33.6 g/dL (ref 32.0–36.0)
MCV: 95.1 fL (ref 80.0–100.0)
Monocytes Absolute: 0.6 10*3/uL (ref 0.2–1.0)
Monocytes Relative: 7 %
Neutro Abs: 4.9 10*3/uL (ref 1.4–6.5)
Neutrophils Relative %: 59 %
Platelets: 169 10*3/uL (ref 150–440)
RBC: 6.02 MIL/uL — ABNORMAL HIGH (ref 4.40–5.90)
RDW: 14.4 % (ref 11.5–14.5)
WBC: 8.3 10*3/uL (ref 3.8–10.6)

## 2015-07-25 LAB — FERRITIN: Ferritin: 90 ng/mL (ref 24–336)

## 2015-07-25 NOTE — Progress Notes (Signed)
[facility] day:  07/25/2015  Chief Complaint: William Ford is a 65 y.o. male with stage IIB colon cancer who is seen for 3 month assessment and review of interval chest CT scan.  HPI: The patient was last seen in the medical oncology clinic by me on 10/25/2014.  At that time, he was seen for 3 month assessment.  CEA was 9.8.  Chest CT on 10/20/2015 revealed no evidence of metastatic disease, but RUL pleural based nodularity that required follow-up.  He was seen by Rachel Bo, NP on 05/09/2015.  At that time, he only noted a grade II neuropathy in his hands and feet.  He denied any melena or hematochezia.  He denied any respiratory symptoms.  Labs included a hematocrit of 55.0, hemoglobin 18.6, MCV 94.5, platelets 170,000, WBC 10,400 with an [place] of 6500.  Comprehensive metabolic panel included a protein of 8.2.  CEA was 10.3.  Chest CT on 07/19/2015 revealed no acute cardiopulmonary abnormalities.  He has a peripheral scar like density within the right upper lobe.  He has emphysema.  Symptomatically, he denies any complaints.  He denies any respiratory symptoms.  He is smoking 1/2 pack per day.  Past Medical History  Diagnosis Date  . Cancer ([place])   . Hypertension     Past Surgical History  Procedure Laterality Date  . Colon surgery  2013    Family History  Problem Relation Age of Onset  . Cancer Maternal Grandfather     Social History:  reports that he has been smoking.  He has never used smokeless tobacco. He reports that he does not drink alcohol or use illicit drugs.  The patient is alone today.  Allergies: No Known Allergies  Current Medications: Current Outpatient Prescriptions  Medication Sig Dispense Refill  . amLODipine-benazepril (LOTREL) 10-40 MG per capsule   0  . CELEBREX 200 MG capsule   0  . gabapentin (NEURONTIN) 300 MG capsule Take 1 capsule by mouth 3 (three) times daily.  0  . L-Methylfolate-Algae-B12-B6  (FOLTANX RF) 3-90.314-2-35 MG CAPS   0  . LYRICA 75 MG capsule take 1 capsule by mouth twice a day 60 capsule 3  . sildenafil (REVATIO) 20 MG tablet Take 2 tablets by mouth as needed.  0   No current facility-administered medications for this visit.   Facility-Administered Medications Ordered in Other Visits  Medication Dose Route Frequency Provider Last Rate Last Dose  . sodium chloride 0.9 % injection 10 mL  10 mL Intravenous PRN Lequita Asal, MD   10 mL at 12/13/14 1043    Review of Systems:  GENERAL:  Feels good.  Active.  No fevers or sweats.  Weight down 6 pounds. PERFORMANCE STATUS (ECOG):  0 HEENT:  No visual changes, runny nose, sore throat, mouth sores or tenderness. Lungs: No shortness of breath or cough.  No hemoptysis. Cardiac:  No chest pain, palpitations, orthopnea, or PND. GI:  No nausea, vomiting, diarrhea, constipation, melena or hematochezia. GU:  No urgency, frequency, dysuria, or hematuria. Musculoskeletal:  No back pain.  No joint pain.  No muscle tenderness. Extremities:  No pain or swelling. Skin:  No rashes or skin changes. Neuro:  Oxaliplatin neuropathy.  No headache, weakness, balance or coordination issues. Endocrine:  No diabetes, thyroid issues, hot flashes or night sweats. Psych:  No mood changes, depression or anxiety. Pain:  No focal pain. Review of systems:  All other systems reviewed and found to be negative.  Physical Exam: Blood pressure 134/84, pulse 89, temperature 97 F (36.1 C), temperature source Tympanic, weight 156 lb 8.4 oz (71 kg). GENERAL:  Well developed, well nourished, sitting comfortably in the exam room in no acute distress. MENTAL STATUS:  Alert and oriented to person, place and time. HEAD:  Alopecia.  Gray beard.  Normocephalic, atraumatic, face symmetric, no Cushingoid features. EYES:  Glasses.  Brown eyes.  Pupils equal round and reactive to light and accomodation.  No conjunctivitis or scleral icterus. ENT:  Oropharynx  clear without lesion.  Tongue normal. Mucous membranes moist.  RESPIRATORY:  Clear to auscultation without rales, wheezes or rhonchi. CARDIOVASCULAR:  Regular rate and rhythm without murmur, rub or gallop. ABDOMEN:  Soft, non-tender, with active bowel sounds, and no hepatosplenomegaly.  No masses. SKIN:  No rashes, ulcers or lesions. EXTREMITIES: No edema, no skin discoloration or tenderness.  No palpable cords. LYMPH NODES: No palpable cervical, supraclavicular, axillary or inguinal adenopathy  NEUROLOGICAL: Unremarkable. PSYCH:  Appropriate.  No visits with results within 3 Day(s) from this visit. Latest known visit with results is:  Appointment on 05/09/2015  Component Date Value Ref Range Status  . WBC 05/09/2015 10.4  3.8 - 10.6 K/uL Final  . RBC 05/09/2015 5.82  4.40 - 5.90 MIL/uL Final  . Hemoglobin 05/09/2015 18.6* 13.0 - 18.0 g/dL Final  . HCT 05/09/2015 55.0* 40.0 - 52.0 % Final  . MCV 05/09/2015 94.5  80.0 - 100.0 fL Final  . MCH 05/09/2015 32.0  26.0 - 34.0 pg Final  . MCHC 05/09/2015 33.8  32.0 - 36.0 g/dL Final  . RDW 05/09/2015 14.2  11.5 - 14.5 % Final  . Platelets 05/09/2015 170  150 - 440 K/uL Final  . Neutrophils Relative % 05/09/2015 61%   Final  . Neutro Abs 05/09/2015 6.5  1.4 - 6.5 K/uL Final  . Lymphocytes Relative 05/09/2015 29%   Final  . Lymphs Abs 05/09/2015 3.0  1.0 - 3.6 K/uL Final  . Monocytes Relative 05/09/2015 7%   Final  . Monocytes Absolute 05/09/2015 0.7  0.2 - 1.0 K/uL Final  . Eosinophils Relative 05/09/2015 1%   Final  . Eosinophils Absolute 05/09/2015 0.1  0 - 0.7 K/uL Final  . Basophils Relative 05/09/2015 2%   Final  . Basophils Absolute 05/09/2015 0.2* 0 - 0.1 K/uL Final  . Sodium 05/09/2015 137  135 - 145 mmol/L Final  . Potassium 05/09/2015 4.6  3.5 - 5.1 mmol/L Final  . Chloride 05/09/2015 103  101 - 111 mmol/L Final  . CO2 05/09/2015 28  22 - 32 mmol/L Final  . Glucose, Bld 05/09/2015 100* 65 - 99 mg/dL Final  . BUN 05/09/2015 13   6 - 20 mg/dL Final  . Creatinine, Ser 05/09/2015 1.16  0.61 - 1.24 mg/dL Final  . Calcium 05/09/2015 9.8  8.9 - 10.3 mg/dL Final  . Total Protein 05/09/2015 8.2* 6.5 - 8.1 g/dL Final  . Albumin 05/09/2015 4.6  3.5 - 5.0 g/dL Final  . AST 05/09/2015 20  15 - 41 U/L Final  . ALT 05/09/2015 15* 17 - 63 U/L Final  . Alkaline Phosphatase 05/09/2015 93  38 - 126 U/L Final  . Total Bilirubin 05/09/2015 1.2  0.3 - 1.2 mg/dL Final  . GFR calc non Af Amer 05/09/2015 >60  >60 mL/min Final  . GFR calc Af Amer 05/09/2015 >60  >60 mL/min Final   Comment: (NOTE) The eGFR has been calculated using the CKD EPI equation. This calculation has not been  validated in all clinical situations. eGFR's persistently <60 mL/min signify possible Chronic Kidney Disease.   . Anion gap 05/09/2015 6  5 - 15 Final  . CEA 05/09/2015 10.3* 0.0 - 4.7 ng/mL Final   Comment: (NOTE)       Roche ECLIA methodology       Nonsmokers  <3.9                                     Smokers     <5.6 Performed At: [facility] [place], [place] [id number] Lindon Romp MD ZM:[id number]     Assessment:  William Ford is a 65 y.o. male with stage IIB colon cancer.  He underwent right colectomy on 11/05/2012.  Pathology revealed a T3N0M0 tumor in the cecum with 17 negative lymph nodes.  Circumferential margin was positive.  There was lymphovascular invasion. He had 1 deposit of metastases in peritoneum.  Mismatch repair was positive.  He received 12 cycles of FOLFOX chemotherapy from 12/31/2012 - 08/19/2013. Chemotherapy was interrupted because of small bowel obstruction due to adhesions in 05/2013.  He has had an elevated CEA with negative imaging studies.  CEA was 10.7 on 12/31/2012, 13.4 on 06/17/2013, 11.7 on 07/18/2013, 8.3 on 09/28/2013, 8.0 on 12/30/2013, 9.6 on 05/17/2014, 9.8 on 10/25/2014, and 10.3 on 05/09/2015.  Last colonoscopy was in 2015.   Chest, abdomen, and pelvic CT scan on 07/03/2014 revealed  no evidence of metastatic disease.  There was a new irregular shaped pleural based pulmonary nodule in the periphery of the RUL most likely infectious/inflammatory. 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    Chest CT on 10/20/2014 revealed a stablle small pleural-based area of nodularity in the right upper lobe (favor benign area of scarring). There was mild diffuse bronchial wall thickening with moderate centrilobular and mild paraseptal emphysema; imaging findings suggestive of underlying COPD. Chest CT on 07/19/2015 revealed no acute cardiopulmonary abnormalities.  He has a peripheral scar like density within the right upper lobe.   He has polycythemia likely secondary to smoking.  Hematocrit was 53.7 on 10/24/2014.  Hematocrit is 57.2 today with a hemoglobin of 19.2.  Symptomatically, he is doing well.  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  He denies any melena or hematochezia.  He denies any respiratory symptoms.  He is trying to quit smoking.  Plan: 1.  Discuss chest CT.  RUL changes felt secondary to scar. 2.  Discuss elevated CEA with negative imaging in 07/03/2014.  Discuss ongoing follow-up and plan for additional imaging (? PET) if continues to rise. 3.  Discuss polycythemia likely secondary to smoking and emphysema.  Discuss work-up.  Discuss phlebotomy. 4.  Labs today:  CBC with diff, CMP, CEA, JAK2 with reflex, epo level, testosterone, ferritin, iron studies. 5.  Phlebotomy 500 cc today. 6.  Discuss smoking cessation. 7.  RTC in 2 weeks for MD assessment, review of labs, and possible phlebotomy.   Lequita Asal, MD  07/25/2015

## 2015-07-25 NOTE — Patient Instructions (Signed)

## 2015-07-25 NOTE — Progress Notes (Signed)
Patient here for results. 

## 2015-07-26 ENCOUNTER — Other Ambulatory Visit: Payer: Self-pay | Admitting: *Deleted

## 2015-07-26 ENCOUNTER — Other Ambulatory Visit: Payer: Self-pay | Admitting: Hematology and Oncology

## 2015-07-26 DIAGNOSIS — R911 Solitary pulmonary nodule: Secondary | ICD-10-CM

## 2015-07-26 DIAGNOSIS — D751 Secondary polycythemia: Secondary | ICD-10-CM

## 2015-07-26 DIAGNOSIS — C182 Malignant neoplasm of ascending colon: Secondary | ICD-10-CM

## 2015-07-28 LAB — TESTOSTERONE: Testosterone: 680 ng/dL (ref 348–1197)

## 2015-07-28 LAB — CEA: CEA: 10.4 ng/mL — ABNORMAL HIGH (ref 0.0–4.7)

## 2015-08-03 LAB — PROTEIN ELECTROPHORESIS, SERUM
A/G Ratio: 1.1 (ref 0.7–1.7)
Albumin ELP: 4.2 g/dL (ref 2.9–4.4)
Alpha-1-Globulin: 0.2 g/dL (ref 0.0–0.4)
Alpha-2-Globulin: 0.6 g/dL (ref 0.4–1.0)
Beta Globulin: 1.5 g/dL — ABNORMAL HIGH (ref 0.7–1.3)
Gamma Globulin: 1.4 g/dL (ref 0.4–1.8)
Globulin, Total: 3.7 g/dL (ref 2.2–3.9)
Total Protein ELP: 7.9 g/dL (ref 6.0–8.5)

## 2015-08-08 ENCOUNTER — Inpatient Hospital Stay: Payer: Medicare Other | Attending: Hematology and Oncology

## 2015-08-08 ENCOUNTER — Encounter: Payer: Self-pay | Admitting: Hematology and Oncology

## 2015-08-08 ENCOUNTER — Inpatient Hospital Stay: Payer: Medicare Other

## 2015-08-08 ENCOUNTER — Inpatient Hospital Stay (HOSPITAL_BASED_OUTPATIENT_CLINIC_OR_DEPARTMENT_OTHER): Payer: Medicare Other | Admitting: Hematology and Oncology

## 2015-08-08 VITALS — BP 112/72 | HR 85 | Temp 97.2°F | Resp 18 | Ht 73.0 in | Wt 158.5 lb

## 2015-08-08 DIAGNOSIS — F17210 Nicotine dependence, cigarettes, uncomplicated: Secondary | ICD-10-CM | POA: Insufficient documentation

## 2015-08-08 DIAGNOSIS — D751 Secondary polycythemia: Secondary | ICD-10-CM | POA: Diagnosis not present

## 2015-08-08 DIAGNOSIS — Z9221 Personal history of antineoplastic chemotherapy: Secondary | ICD-10-CM | POA: Insufficient documentation

## 2015-08-08 DIAGNOSIS — C180 Malignant neoplasm of cecum: Secondary | ICD-10-CM | POA: Diagnosis not present

## 2015-08-08 DIAGNOSIS — Z79899 Other long term (current) drug therapy: Secondary | ICD-10-CM

## 2015-08-08 DIAGNOSIS — I10 Essential (primary) hypertension: Secondary | ICD-10-CM | POA: Insufficient documentation

## 2015-08-08 DIAGNOSIS — C182 Malignant neoplasm of ascending colon: Secondary | ICD-10-CM

## 2015-08-08 DIAGNOSIS — G629 Polyneuropathy, unspecified: Secondary | ICD-10-CM

## 2015-08-08 DIAGNOSIS — C189 Malignant neoplasm of colon, unspecified: Secondary | ICD-10-CM

## 2015-08-08 DIAGNOSIS — R911 Solitary pulmonary nodule: Secondary | ICD-10-CM

## 2015-08-08 LAB — COMPREHENSIVE METABOLIC PANEL
ALT: 14 U/L — ABNORMAL LOW (ref 17–63)
AST: 22 U/L (ref 15–41)
Albumin: 4.3 g/dL (ref 3.5–5.0)
Alkaline Phosphatase: 99 U/L (ref 38–126)
Anion gap: 6 (ref 5–15)
BUN: 12 mg/dL (ref 6–20)
CO2: 26 mmol/L (ref 22–32)
Calcium: 9.6 mg/dL (ref 8.9–10.3)
Chloride: 104 mmol/L (ref 101–111)
Creatinine, Ser: 1.13 mg/dL (ref 0.61–1.24)
GFR calc Af Amer: >60 mL/min (ref >60)
GFR calc non Af Amer: >60 mL/min (ref >60)
Glucose, Bld: 103 mg/dL — ABNORMAL HIGH (ref 65–99)
Potassium: 4.6 mmol/L (ref 3.5–5.1)
Sodium: 136 mmol/L (ref 135–145)
Total Bilirubin: 0.6 mg/dL (ref 0.3–1.2)
Total Protein: 8.1 g/dL (ref 6.5–8.1)

## 2015-08-08 LAB — CBC WITH DIFFERENTIAL/PLATELET
Basophils Absolute: 0.1 10*3/uL (ref 0–0.1)
Basophils Relative: 2 %
Eosinophils Absolute: 0.1 10*3/uL (ref 0–0.7)
Eosinophils Relative: 1 %
HCT: 52.5 % — ABNORMAL HIGH (ref 40.0–52.0)
Hemoglobin: 17.6 g/dL (ref 13.0–18.0)
Lymphocytes Relative: 37 %
Lymphs Abs: 3.7 10*3/uL — ABNORMAL HIGH (ref 1.0–3.6)
MCH: 32.2 pg (ref 26.0–34.0)
MCHC: 33.5 g/dL (ref 32.0–36.0)
MCV: 96.1 fL (ref 80.0–100.0)
Monocytes Absolute: 0.7 10*3/uL (ref 0.2–1.0)
Monocytes Relative: 7 %
Neutro Abs: 5.4 10*3/uL (ref 1.4–6.5)
Neutrophils Relative %: 53 %
Platelets: 156 10*3/uL (ref 150–440)
RBC: 5.46 MIL/uL (ref 4.40–5.90)
RDW: 14.5 % (ref 11.5–14.5)
WBC: 10 10*3/uL (ref 3.8–10.6)

## 2015-08-08 LAB — JAK2 EXON 12 MUTATION ANALYSIS

## 2015-08-08 LAB — JAK2  V617F QUAL. WITH REFLEX TO EXON 12

## 2015-08-08 LAB — ERYTHROPOIETIN: Erythropoietin: 6 m[IU]/mL (ref 2.6–18.5)

## 2015-08-08 NOTE — Progress Notes (Signed)
Pt reports no changes since last visit.  Reports after last phlebotomy he felt weak and tired.

## 2015-08-08 NOTE — Progress Notes (Signed)
[facility] day:  08/08/2015  Chief Complaint: William Ford is a 64 y.o. male with stage IIB colon cancer and polycythemia who is seen for review of interval testing.  HPI: The patient was last seen in the medical oncology clinic by me on 07/25/2015.  At that time, chest CT on 07/19/2015 revealed no acute cardiopulmonary abnormalities.  He had a peripheral scar like density within the right upper lobe.  We discussed his polycythemia likely due to smoking.  A work-up was performed.  Work-up on 07/25/2015 revealed a hematocrit of 57.2, hemoglobin 19.2, MCV 95.1, platelets 169,000, WBC 8300 with an ANC of 4900.  Erythropoietin level was 6.0 (2.6-18.5).  Testosterone level was 680.  JAK2 was negative for V617F and exon 12.  Ferritin was 90, iron saturation 24% and TIBC 372.    At last visit, he underwent phlebotomy of 500 cc. He states that initially he felt sluggish and weak.  After a few days, he felt stronger.  Symptomatically, he denies any complaints.  He denies any respiratory symptoms.  He is still trying to quit smoking.   Past Medical History  Diagnosis Date  . Cancer ([place])   . Hypertension     Past Surgical History  Procedure Laterality Date  . Colon surgery  2013    Family History  Problem Relation Age of Onset  . Cancer Maternal Grandfather     Social History:  reports that he has been smoking Cigarettes.  He has been smoking about 0.50 packs per day. He has never used smokeless tobacco. He reports that he does not drink alcohol or use illicit drugs.  The patient is alone today.  Allergies: No Known Allergies  Current Medications: Current Outpatient Prescriptions  Medication Sig Dispense Refill  . amLODipine-benazepril (LOTREL) 10-40 MG per capsule   0  . CELEBREX 200 MG capsule   0  . gabapentin (NEURONTIN) 300 MG capsule Take 1 capsule by mouth 3 (three) times daily.  0  . L-Methylfolate-Algae-B12-B6 (FOLTANX RF)  3-90.314-2-35 MG CAPS   0  . LYRICA 75 MG capsule take 1 capsule by mouth twice a day 60 capsule 3  . sildenafil (REVATIO) 20 MG tablet Take 2 tablets by mouth as needed.  0   No current facility-administered medications for this visit.   Facility-Administered Medications Ordered in Other Visits  Medication Dose Route Frequency Provider Last Rate Last Dose  . sodium chloride 0.9 % injection 10 mL  10 mL Intravenous PRN Lequita Asal, MD   10 mL at 12/13/14 1043    Review of Systems:  GENERAL:  Feels pretty good.  Active.  No fevers or sweats.  Weight up 10 pounds. PERFORMANCE STATUS (ECOG):  0 HEENT:  No visual changes, runny nose, sore throat, mouth sores or tenderness. Lungs: No shortness of breath or cough.  No hemoptysis. Cardiac:  No chest pain, palpitations, orthopnea, or PND. GI:  No nausea, vomiting, diarrhea, constipation, melena or hematochezia. GU:  No urgency, frequency, dysuria, or hematuria. Musculoskeletal:  No back pain.  No joint pain.  No muscle tenderness. Extremities:  No pain or swelling. Skin:  No rashes or skin changes. Neuro:  Oxaliplatin neuropathy.  No headache, weakness, balance or coordination issues. Endocrine:  No diabetes, thyroid issues, hot flashes or night sweats. Psych:  No mood changes, depression or anxiety. Pain:  No focal pain. Review of systems:  All other systems reviewed and found to be negative.  Physical Exam: Blood  pressure 112/72, pulse 85, temperature 97.2 F (36.2 C), temperature source Tympanic, resp. rate 18, height 6' 1"  (1.854 m), weight 158 lb 8.2 oz (71.9 kg). GENERAL:  Well developed, well nourished, sitting comfortably in the exam room in no acute distress. MENTAL STATUS:  Alert and oriented to person, place and time. HEAD:  Alopecia.  Albertina Parr.  Normocephalic, atraumatic, face symmetric, no Cushingoid features. EYES:  Glasses.  Brown eyes.  No conjunctivitis or scleral icterus. RESPIRATORY:  Clear to auscultation  without rales, wheezes or rhonchi. CARDIOVASCULAR:  Regular rate and rhythm without murmur, rub or gallop. NEUROLOGICAL: Unremarkable. PSYCH:  Appropriate.  Appointment on 08/08/2015  Component Date Value Ref Range Status  . Sodium 08/08/2015 136  135 - 145 mmol/L Final  . Potassium 08/08/2015 4.6  3.5 - 5.1 mmol/L Final  . Chloride 08/08/2015 104  101 - 111 mmol/L Final  . CO2 08/08/2015 26  22 - 32 mmol/L Final  . Glucose, Bld 08/08/2015 103* 65 - 99 mg/dL Final  . BUN 08/08/2015 12  6 - 20 mg/dL Final  . Creatinine, Ser 08/08/2015 1.13  0.61 - 1.24 mg/dL Final  . Calcium 08/08/2015 9.6  8.9 - 10.3 mg/dL Final  . Total Protein 08/08/2015 8.1  6.5 - 8.1 g/dL Final  . Albumin 08/08/2015 4.3  3.5 - 5.0 g/dL Final  . AST 08/08/2015 22  15 - 41 U/L Final  . ALT 08/08/2015 14* 17 - 63 U/L Final  . Alkaline Phosphatase 08/08/2015 99  38 - 126 U/L Final  . Total Bilirubin 08/08/2015 0.6  0.3 - 1.2 mg/dL Final  . GFR calc non Af Amer 08/08/2015 >60  >60 mL/min Final  . GFR calc Af Amer 08/08/2015 >60  >60 mL/min Final   Comment: (NOTE) The eGFR has been calculated using the CKD EPI equation. This calculation has not been validated in all clinical situations. eGFR's persistently <60 mL/min signify possible Chronic Kidney Disease.   . Anion gap 08/08/2015 6  5 - 15 Final  . WBC 08/08/2015 10.0  3.8 - 10.6 K/uL Final  . RBC 08/08/2015 5.46  4.40 - 5.90 MIL/uL Final  . Hemoglobin 08/08/2015 17.6  13.0 - 18.0 g/dL Final  . HCT 08/08/2015 52.5* 40.0 - 52.0 % Final  . MCV 08/08/2015 96.1  80.0 - 100.0 fL Final  . MCH 08/08/2015 32.2  26.0 - 34.0 pg Final  . MCHC 08/08/2015 33.5  32.0 - 36.0 g/dL Final  . RDW 08/08/2015 14.5  11.5 - 14.5 % Final  . Platelets 08/08/2015 156  150 - 440 K/uL Final  . Neutrophils Relative % 08/08/2015 53%   Final  . Neutro Abs 08/08/2015 5.4  1.4 - 6.5 K/uL Final  . Lymphocytes Relative 08/08/2015 37%   Final  . Lymphs Abs 08/08/2015 3.7* 1.0 - 3.6 K/uL Final   . Monocytes Relative 08/08/2015 7%   Final  . Monocytes Absolute 08/08/2015 0.7  0.2 - 1.0 K/uL Final  . Eosinophils Relative 08/08/2015 1%   Final  . Eosinophils Absolute 08/08/2015 0.1  0 - 0.7 K/uL Final  . Basophils Relative 08/08/2015 2%   Final  . Basophils Absolute 08/08/2015 0.1  0 - 0.1 K/uL Final    Assessment:  William Ford is a 65 y.o. male with stage IIB colon cancer.  He underwent right colectomy on 11/05/2012.  Pathology revealed a T3N0M0 tumor in the cecum with 17 negative lymph nodes.  Circumferential margin was positive.  There was lymphovascular invasion. He had 1  deposit of metastases in peritoneum.  Mismatch repair was positive.  He received 12 cycles of FOLFOX chemotherapy from 12/31/2012 - 08/19/2013. Chemotherapy was interrupted because of small bowel obstruction due to adhesions in 05/2013.  He has had an elevated CEA with negative imaging studies.  CEA was 10.7 on 12/31/2012, 13.4 on 06/17/2013, 11.7 on 07/18/2013, 8.3 on 09/28/2013, 8.0 on 12/30/2013, 9.6 on 05/17/2014, 9.8 on 10/25/2014, 10.3 on 05/09/2015, and 10.4 on 07/25/2015.  Last colonoscopy was in 2015.   Chest, abdomen, and pelvic CT scan on 07/03/2014 revealed no evidence of metastatic disease.  There was a new irregular shaped pleural based pulmonary nodule in the periphery of the RUL most likely infectious/inflammatory. 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    Chest CT on 10/20/2014 revealed a stablle small pleural-based area of nodularity in the right upper lobe (favor benign area of scarring). There was mild diffuse bronchial wall thickening with moderate centrilobular and mild paraseptal emphysema; imaging findings suggestive of underlying COPD.  Chest CT on 07/19/2015 revealed no acute cardiopulmonary abnormalities.  He has a peripheral scar like density within the right upper lobe.   He has secondary polycythemia secondary to smoking. Work-up on 07/25/2015 revealed a hematocrit of 57.2,  hemoglobin 19.2, MCV 95.1, platelets 169,000, WBC 8300 with an ANC of 4900.  Erythropoietin level was 6.0 (2.6-18.5).  Testosterone level was 680.  JAK2 was negative for V617F and exon 12.  Ferritin was 90, iron saturation 24% , and TIBC 372.  He underwent phlebotomy on 07/25/2015 with subsequent fatigue.  He will undergo small volume phlebotomies (250 cc) to maintain a normal hematocrit.  Symptomatically, he is doing well.  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  He denies any melena or hematochezia.  He denies any respiratory symptoms.  He is smoking.  Plan: 1.  Labs today:  CBC with diff, CMP, CEA. 2.  Discuss results of polycythemia evaluation.  No evidence of PV.  Discuss secondary polycythemia secondary to smoking.  Encourage smoking cessation.  Discuss phlebotomy of small volume (250 cc) to avoid symptoms of fatigue and maintain a hematocrit in the normal range.  Patient's hematocrit is upper range of normal.  He would like to defer phlebotomy for 2 weeks. 3.  RTC in 2 weeks for labs (CBC) +/- phlebotomy. 4.  RTC in 6 weeks for labs (CBC ) +/- phlebotomy. 5.  RTC in 3 months for MD assess, labs (CBC with diff, CMP, CEA) +/- phlebotomy.   Lequita Asal, MD  08/08/2015, 12:07 PM

## 2015-08-09 LAB — CEA: CEA: 8.4 ng/mL — ABNORMAL HIGH (ref 0.0–4.7)

## 2015-08-22 ENCOUNTER — Inpatient Hospital Stay: Payer: Medicare Other

## 2015-08-22 ENCOUNTER — Other Ambulatory Visit: Payer: Self-pay | Admitting: *Deleted

## 2015-08-22 VITALS — BP 130/79 | HR 99 | Temp 95.6°F | Resp 20

## 2015-08-22 DIAGNOSIS — R911 Solitary pulmonary nodule: Secondary | ICD-10-CM

## 2015-08-22 DIAGNOSIS — D751 Secondary polycythemia: Secondary | ICD-10-CM

## 2015-08-22 DIAGNOSIS — C180 Malignant neoplasm of cecum: Secondary | ICD-10-CM | POA: Diagnosis not present

## 2015-08-22 DIAGNOSIS — Z95828 Presence of other vascular implants and grafts: Secondary | ICD-10-CM

## 2015-08-22 DIAGNOSIS — C182 Malignant neoplasm of ascending colon: Secondary | ICD-10-CM

## 2015-08-22 LAB — CBC WITH DIFFERENTIAL/PLATELET
Basophils Absolute: 0.1 10*3/uL (ref 0–0.1)
Basophils Relative: 1 %
Eosinophils Absolute: 0.1 10*3/uL (ref 0–0.7)
Eosinophils Relative: 1 %
HCT: 52.6 % — ABNORMAL HIGH (ref 40.0–52.0)
Hemoglobin: 17.6 g/dL (ref 13.0–18.0)
Lymphocytes Relative: 32 %
Lymphs Abs: 3.3 10*3/uL (ref 1.0–3.6)
MCH: 32.3 pg (ref 26.0–34.0)
MCHC: 33.5 g/dL (ref 32.0–36.0)
MCV: 96.5 fL (ref 80.0–100.0)
Monocytes Absolute: 0.7 10*3/uL (ref 0.2–1.0)
Monocytes Relative: 7 %
Neutro Abs: 6 10*3/uL (ref 1.4–6.5)
Neutrophils Relative %: 59 %
Platelets: 203 10*3/uL (ref 150–440)
RBC: 5.45 MIL/uL (ref 4.40–5.90)
RDW: 14.8 % — ABNORMAL HIGH (ref 11.5–14.5)
WBC: 10.2 10*3/uL (ref 3.8–10.6)

## 2015-08-22 MED ORDER — HEPARIN SOD (PORK) LOCK FLUSH 100 UNIT/ML IV SOLN
INTRAVENOUS | Status: AC
  Filled 2015-08-22: qty 5

## 2015-08-22 MED ORDER — HEPARIN SOD (PORK) LOCK FLUSH 100 UNIT/ML IV SOLN
500.0000 [IU] | Freq: Once | INTRAVENOUS | Status: AC
  Administered 2015-08-22: 500 [IU] via INTRAVENOUS

## 2015-08-22 MED ORDER — SODIUM CHLORIDE 0.9% FLUSH
10.0000 mL | INTRAVENOUS | Status: DC | PRN
  Administered 2015-08-22: 10 mL via INTRAVENOUS
  Filled 2015-08-22: qty 10

## 2015-08-28 ENCOUNTER — Encounter: Payer: Self-pay | Admitting: Hematology and Oncology

## 2015-09-19 ENCOUNTER — Inpatient Hospital Stay: Payer: Medicare Other | Attending: Hematology and Oncology

## 2015-09-19 ENCOUNTER — Other Ambulatory Visit: Payer: Self-pay | Admitting: Hematology and Oncology

## 2015-09-19 ENCOUNTER — Inpatient Hospital Stay: Payer: Medicare Other

## 2015-09-19 DIAGNOSIS — C180 Malignant neoplasm of cecum: Secondary | ICD-10-CM | POA: Insufficient documentation

## 2015-09-19 DIAGNOSIS — D751 Secondary polycythemia: Secondary | ICD-10-CM

## 2015-09-19 DIAGNOSIS — C189 Malignant neoplasm of colon, unspecified: Secondary | ICD-10-CM

## 2015-09-19 LAB — CBC WITH DIFFERENTIAL/PLATELET
Basophils Absolute: 0.2 10*3/uL — ABNORMAL HIGH (ref 0–0.1)
Basophils Relative: 2 %
Eosinophils Absolute: 0.1 10*3/uL (ref 0–0.7)
Eosinophils Relative: 1 %
HCT: 51.2 % (ref 40.0–52.0)
Hemoglobin: 17.3 g/dL (ref 13.0–18.0)
Lymphocytes Relative: 28 %
Lymphs Abs: 2.9 10*3/uL (ref 1.0–3.6)
MCH: 32.6 pg (ref 26.0–34.0)
MCHC: 33.8 g/dL (ref 32.0–36.0)
MCV: 96.6 fL (ref 80.0–100.0)
Monocytes Absolute: 0.8 10*3/uL (ref 0.2–1.0)
Monocytes Relative: 8 %
Neutro Abs: 6.4 10*3/uL (ref 1.4–6.5)
Neutrophils Relative %: 61 %
Platelets: 198 10*3/uL (ref 150–440)
RBC: 5.3 MIL/uL (ref 4.40–5.90)
RDW: 14.5 % (ref 11.5–14.5)
WBC: 10.4 10*3/uL (ref 3.8–10.6)

## 2015-10-03 ENCOUNTER — Inpatient Hospital Stay: Payer: Medicare Other | Attending: Hematology and Oncology

## 2015-10-03 ENCOUNTER — Inpatient Hospital Stay: Payer: Medicare Other

## 2015-10-03 DIAGNOSIS — Z452 Encounter for adjustment and management of vascular access device: Secondary | ICD-10-CM | POA: Insufficient documentation

## 2015-10-03 DIAGNOSIS — C180 Malignant neoplasm of cecum: Secondary | ICD-10-CM | POA: Insufficient documentation

## 2015-10-03 DIAGNOSIS — Z95828 Presence of other vascular implants and grafts: Secondary | ICD-10-CM

## 2015-10-03 MED ORDER — HEPARIN SOD (PORK) LOCK FLUSH 100 UNIT/ML IV SOLN
500.0000 [IU] | Freq: Once | INTRAVENOUS | Status: AC
  Administered 2015-10-03: 500 [IU] via INTRAVENOUS
  Filled 2015-10-03: qty 5

## 2015-10-03 MED ORDER — SODIUM CHLORIDE 0.9% FLUSH
10.0000 mL | INTRAVENOUS | Status: DC | PRN
  Administered 2015-10-03: 10 mL via INTRAVENOUS
  Filled 2015-10-03: qty 10

## 2015-11-06 NOTE — Progress Notes (Signed)
[facility] day:  11/07/2015   Chief Complaint: William Ford is a 65 y.o. male with stage IIB colon cancer and polycythemia who is seen for 3 month assessment.  HPI: The patient was last seen in the medical oncology clinic on 08/08/2015.  At that time,  he denied any complaints.  He denied any respiratory symptoms.  He was still trying to quit smoking.  CBC revealed a hematocrit of 52 and hemoglobin 17.6.  CMP was normal  CEA was 8.4.  He underwent phlebotomy of 130 cc on 08/22/2015.   During the interim, he states that he is doing well.  He continues to smoke. He states that for 1-2 days, he did not smoke at all.  He is smoking about a half a pack a day.   Past Medical History:  Diagnosis Date  . Cancer ([place])   . Hypertension     Past Surgical History:  Procedure Laterality Date  . COLON SURGERY  2013    Family History  Problem Relation Age of Onset  . Cancer Maternal Grandfather     Social History:  reports that he has been smoking Cigarettes.  He has been smoking about 0.50 packs per day. He has never used smokeless tobacco. He reports that he does not drink alcohol or use drugs.  The patient is alone today.  Allergies: No Known Allergies  Current Medications: Current Outpatient Prescriptions  Medication Sig Dispense Refill  . amLODipine-benazepril (LOTREL) 10-40 MG per capsule Take 1 capsule by mouth daily.   0  . gabapentin (NEURONTIN) 300 MG capsule Take 1 capsule by mouth daily.   0  . L-Methylfolate-Algae-B12-B6 (FOLTANX RF) 3-90.314-2-35 MG CAPS   0  . CELEBREX 200 MG capsule Take 200 mg by mouth daily.   0  . CHANTIX STARTING MONTH PAK 0.5 MG X 11 & 1 MG X 42 tablet See admin instructions. see package  0  . LYRICA 75 MG capsule take 1 capsule by mouth twice a day (Patient not taking: Reported on 11/07/2015) 60 capsule 3  . sildenafil (REVATIO) 20 MG tablet Take 2 tablets by mouth as needed.  0   No current  facility-administered medications for this visit.    Facility-Administered Medications Ordered in Other Visits  Medication Dose Route Frequency Provider Last Rate Last Dose  . heparin lock flush 100 unit/mL  500 Units Intravenous Once Lequita Asal, MD      . sodium chloride 0.9 % injection 10 mL  10 mL Intravenous PRN Lequita Asal, MD   10 mL at 12/13/14 1043  . sodium chloride flush (NS) 0.9 % injection 10 mL  10 mL Intravenous PRN Lequita Asal, MD   10 mL at 11/07/15 1030    Review of Systems:  GENERAL:  Feels fine.  No fevers or sweats.  Weight down 1 pound. PERFORMANCE STATUS (ECOG):  0 HEENT:  No visual changes, runny nose, sore throat, mouth sores or tenderness. Lungs: No shortness of breath or cough.  No hemoptysis. Cardiac:  No chest pain, palpitations, orthopnea, or PND. GI:  No nausea, vomiting, diarrhea, constipation, melena or hematochezia. GU:  No urgency, frequency, dysuria, or hematuria. Musculoskeletal:  No back pain.  No joint pain.  No muscle tenderness. Extremities:  No pain or swelling. Skin:  No rashes or skin changes. Neuro:  Oxaliplatin neuropathy.  No headache, weakness, balance or coordination issues. Endocrine:  No diabetes, thyroid issues, hot flashes or night sweats.  Psych:  No mood changes, depression or anxiety. Pain:  No focal pain. Review of systems:  All other systems reviewed and found to be negative.  Physical Exam: Blood pressure 128/72, pulse 78, temperature 97.5 F (36.4 C), temperature source Tympanic, resp. rate 18, weight 157 lb 1.2 oz (71.2 kg). GENERAL:  Well developed, well nourished, gentleman sitting comfortably in the exam room in no acute distress. MENTAL STATUS:  Alert and oriented to person, place and time. HEAD:  Alopecia.  Albertina Parr.  Normocephalic, atraumatic, face symmetric, no Cushingoid features. EYES:  Glasses.  Brown eyes.  Pupils equal round and reactive to light and accomodation.  No conjunctivitis or  scleral icterus. ENT:  Oropharynx clear without lesion.  Tongue normal. Mucous membranes moist.  RESPIRATORY:  Clear to auscultation without rales, wheezes or rhonchi. CARDIOVASCULAR:  Regular rate and rhythm without murmur, rub or gallop. ABDOMEN:  Soft, non-tender, with active bowel sounds, and no hepatosplenomegaly.  No masses. SKIN:  No rashes, ulcers or lesions. EXTREMITIES: No edema, no skin discoloration or tenderness.  No palpable cords. LYMPH NODES: No palpable cervical, supraclavicular, axillary or inguinal adenopathy  NEUROLOGICAL: Unremarkable. PSYCH:  Appropriate.   Infusion on 11/07/2015  Component Date Value Ref Range Status  . WBC 11/07/2015 10.3  3.8 - 10.6 K/uL Final  . RBC 11/07/2015 5.47  4.40 - 5.90 MIL/uL Final  . Hemoglobin 11/07/2015 17.5  13.0 - 18.0 g/dL Final  . HCT 11/07/2015 52.1* 40.0 - 52.0 % Final  . MCV 11/07/2015 95.3  80.0 - 100.0 fL Final  . MCH 11/07/2015 32.1  26.0 - 34.0 pg Final  . MCHC 11/07/2015 33.6  32.0 - 36.0 g/dL Final  . RDW 11/07/2015 13.9  11.5 - 14.5 % Final  . Platelets 11/07/2015 174  150 - 440 K/uL Final  . Neutrophils Relative % 11/07/2015 57  % Final  . Neutro Abs 11/07/2015 6.0  1.4 - 6.5 K/uL Final  . Lymphocytes Relative 11/07/2015 33  % Final  . Lymphs Abs 11/07/2015 3.3  1.0 - 3.6 K/uL Final  . Monocytes Relative 11/07/2015 8  % Final  . Monocytes Absolute 11/07/2015 0.8  0.2 - 1.0 K/uL Final  . Eosinophils Relative 11/07/2015 1  % Final  . Eosinophils Absolute 11/07/2015 0.1  0 - 0.7 K/uL Final  . Basophils Relative 11/07/2015 1  % Final  . Basophils Absolute 11/07/2015 0.1  0 - 0.1 K/uL Final  . Sodium 11/07/2015 137  135 - 145 mmol/L Final  . Potassium 11/07/2015 4.1  3.5 - 5.1 mmol/L Final  . Chloride 11/07/2015 106  101 - 111 mmol/L Final  . CO2 11/07/2015 24  22 - 32 mmol/L Final  . Glucose, Bld 11/07/2015 103* 65 - 99 mg/dL Final  . BUN 11/07/2015 9  6 - 20 mg/dL Final  . Creatinine, Ser 11/07/2015 0.97  0.61 -  1.24 mg/dL Final  . Calcium 11/07/2015 9.0  8.9 - 10.3 mg/dL Final  . Total Protein 11/07/2015 7.8  6.5 - 8.1 g/dL Final  . Albumin 11/07/2015 4.2  3.5 - 5.0 g/dL Final  . AST 11/07/2015 18  15 - 41 U/L Final  . ALT 11/07/2015 13* 17 - 63 U/L Final  . Alkaline Phosphatase 11/07/2015 89  38 - 126 U/L Final  . Total Bilirubin 11/07/2015 0.6  0.3 - 1.2 mg/dL Final  . GFR calc non Af Amer 11/07/2015 >60  >60 mL/min Final  . GFR calc Af Amer 11/07/2015 >60  >60 mL/min Final  Comment: (NOTE) The eGFR has been calculated using the CKD EPI equation. This calculation has not been validated in all clinical situations. eGFR's persistently <60 mL/min signify possible Chronic Kidney Disease.   . Anion gap 11/07/2015 7  5 - 15 Final    Assessment:  William Ford is a 65 y.o. male with stage IIB colon cancer.  He underwent right colectomy on 11/05/2012.  Pathology revealed a T3N0M0 tumor in the cecum with 17 negative lymph nodes.  Circumferential margin was positive.  There was lymphovascular invasion. He had 1 deposit of metastases in peritoneum.  Mismatch repair was positive.  He received 12 cycles of FOLFOX chemotherapy from 12/31/2012 - 08/19/2013. Chemotherapy was interrupted because of small bowel obstruction due to adhesions in 05/2013.  He has had an elevated CEA with negative imaging studies.  CEA was 10.7 on 12/31/2012, 13.4 on 06/17/2013, 11.7 on 07/18/2013, 8.3 on 09/28/2013, 8.0 on 12/30/2013, 9.6 on 05/17/2014, 9.8 on 10/25/2014, 10.3 on 05/09/2015, and 10.4 on 07/25/2015.  Last colonoscopy was in 2015.   Chest, abdomen, and pelvic CT scan on 07/03/2014 revealed no evidence of metastatic disease.  There was a new irregular shaped pleural based pulmonary nodule in the periphery of the RUL most likely infectious/inflammatory. 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    Chest CT on 10/20/2014 revealed a stablle small pleural-based area of nodularity in the right upper lobe (favor benign  area of scarring). There was mild diffuse bronchial wall thickening with moderate centrilobular and mild paraseptal emphysema; imaging findings suggestive of underlying COPD.  Chest CT on 07/19/2015 revealed no acute cardiopulmonary abnormalities.  He has a peripheral scar like density within the right upper lobe.   He has secondary polycythemia secondary to smoking. Work-up on 07/25/2015 revealed a hematocrit of 57.2, hemoglobin 19.2, MCV 95.1, platelets 169,000, WBC 8300 with an ANC of 4900.  Erythropoietin level was 6.0 (2.6-18.5).  Testosterone level was 680.  JAK2 was negative for V617F and exon 12.  Ferritin was 90, iron saturation 24% , and TIBC 372.  He underwent phlebotomy on 07/25/2015 with subsequent fatigue.  He underwent phlebotomy of 130 cc on 08/22/2015.  He will undergo small volume phlebotomies (250 cc) to maintain a normal hematocrit.  Symptomatically, he is doing well.  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  He denies any melena or hematochezia.  He denies any respiratory symptoms.  He is smoking 1/2 pack a day.  Hematocrit is 52.1.  Plan: 1.  Labs today:  CBC with diff, CMP, CEA. 2.  Discuss blood counts and last phlebotomy.  Discuss low volume phlebotomy to maintain normal hematocrit. 3.  Phlebotomy (200 cc) today. 4.  Encourage smoking cessation. 5.  Discuss prior CEAs.  Discuss plan for reimaging if CEA out of typical range or concerning symptoms. 6.  Port flush every 6-8 weeks. 7.  RTC in 3 months for MD assess, labs (CBC with diff, CMP, CEA) and +/- phlebotomy.   Lequita Asal, MD  11/07/2015, 11:40 AM

## 2015-11-07 ENCOUNTER — Other Ambulatory Visit: Payer: Self-pay | Admitting: Hematology and Oncology

## 2015-11-07 ENCOUNTER — Inpatient Hospital Stay: Payer: Medicare Other | Attending: Hematology and Oncology

## 2015-11-07 ENCOUNTER — Encounter: Payer: Self-pay | Admitting: Hematology and Oncology

## 2015-11-07 ENCOUNTER — Other Ambulatory Visit: Payer: Self-pay | Admitting: *Deleted

## 2015-11-07 ENCOUNTER — Inpatient Hospital Stay (HOSPITAL_BASED_OUTPATIENT_CLINIC_OR_DEPARTMENT_OTHER): Payer: Medicare Other | Admitting: Hematology and Oncology

## 2015-11-07 ENCOUNTER — Inpatient Hospital Stay: Payer: Medicare Other

## 2015-11-07 VITALS — BP 128/72 | HR 78 | Temp 97.5°F | Resp 18 | Wt 157.1 lb

## 2015-11-07 DIAGNOSIS — Z85038 Personal history of other malignant neoplasm of large intestine: Secondary | ICD-10-CM | POA: Insufficient documentation

## 2015-11-07 DIAGNOSIS — R918 Other nonspecific abnormal finding of lung field: Secondary | ICD-10-CM | POA: Insufficient documentation

## 2015-11-07 DIAGNOSIS — I10 Essential (primary) hypertension: Secondary | ICD-10-CM | POA: Insufficient documentation

## 2015-11-07 DIAGNOSIS — Z79899 Other long term (current) drug therapy: Secondary | ICD-10-CM

## 2015-11-07 DIAGNOSIS — D751 Secondary polycythemia: Secondary | ICD-10-CM

## 2015-11-07 DIAGNOSIS — F17210 Nicotine dependence, cigarettes, uncomplicated: Secondary | ICD-10-CM | POA: Insufficient documentation

## 2015-11-07 DIAGNOSIS — G629 Polyneuropathy, unspecified: Secondary | ICD-10-CM

## 2015-11-07 DIAGNOSIS — Z9221 Personal history of antineoplastic chemotherapy: Secondary | ICD-10-CM | POA: Diagnosis not present

## 2015-11-07 DIAGNOSIS — C182 Malignant neoplasm of ascending colon: Secondary | ICD-10-CM

## 2015-11-07 DIAGNOSIS — C189 Malignant neoplasm of colon, unspecified: Secondary | ICD-10-CM

## 2015-11-07 LAB — COMPREHENSIVE METABOLIC PANEL
ALT: 13 U/L — ABNORMAL LOW (ref 17–63)
AST: 18 U/L (ref 15–41)
Albumin: 4.2 g/dL (ref 3.5–5.0)
Alkaline Phosphatase: 89 U/L (ref 38–126)
Anion gap: 7 (ref 5–15)
BUN: 9 mg/dL (ref 6–20)
CO2: 24 mmol/L (ref 22–32)
Calcium: 9 mg/dL (ref 8.9–10.3)
Chloride: 106 mmol/L (ref 101–111)
Creatinine, Ser: 0.97 mg/dL (ref 0.61–1.24)
GFR calc Af Amer: >60 mL/min (ref >60)
GFR calc non Af Amer: >60 mL/min (ref >60)
Glucose, Bld: 103 mg/dL — ABNORMAL HIGH (ref 65–99)
Potassium: 4.1 mmol/L (ref 3.5–5.1)
Sodium: 137 mmol/L (ref 135–145)
Total Bilirubin: 0.6 mg/dL (ref 0.3–1.2)
Total Protein: 7.8 g/dL (ref 6.5–8.1)

## 2015-11-07 LAB — CBC WITH DIFFERENTIAL/PLATELET
Basophils Absolute: 0.1 10*3/uL (ref 0–0.1)
Basophils Relative: 1 %
Eosinophils Absolute: 0.1 10*3/uL (ref 0–0.7)
Eosinophils Relative: 1 %
HCT: 52.1 % — ABNORMAL HIGH (ref 40.0–52.0)
Hemoglobin: 17.5 g/dL (ref 13.0–18.0)
Lymphocytes Relative: 33 %
Lymphs Abs: 3.3 10*3/uL (ref 1.0–3.6)
MCH: 32.1 pg (ref 26.0–34.0)
MCHC: 33.6 g/dL (ref 32.0–36.0)
MCV: 95.3 fL (ref 80.0–100.0)
Monocytes Absolute: 0.8 10*3/uL (ref 0.2–1.0)
Monocytes Relative: 8 %
Neutro Abs: 6 10*3/uL (ref 1.4–6.5)
Neutrophils Relative %: 57 %
Platelets: 174 10*3/uL (ref 150–440)
RBC: 5.47 MIL/uL (ref 4.40–5.90)
RDW: 13.9 % (ref 11.5–14.5)
WBC: 10.3 10*3/uL (ref 3.8–10.6)

## 2015-11-07 MED ORDER — HEPARIN SOD (PORK) LOCK FLUSH 100 UNIT/ML IV SOLN
500.0000 [IU] | Freq: Once | INTRAVENOUS | Status: AC
  Administered 2015-11-07: 500 [IU] via INTRAVENOUS
  Filled 2015-11-07: qty 5

## 2015-11-07 MED ORDER — SODIUM CHLORIDE 0.9% FLUSH
10.0000 mL | INTRAVENOUS | Status: DC | PRN
  Administered 2015-11-07: 10 mL via INTRAVENOUS
  Filled 2015-11-07: qty 10

## 2015-11-07 NOTE — Progress Notes (Signed)
Patient is here for follow up, he has no complaints today.

## 2015-11-08 LAB — CEA: CEA: 8.8 ng/mL — ABNORMAL HIGH (ref 0.0–4.7)

## 2015-11-14 ENCOUNTER — Inpatient Hospital Stay: Payer: Medicare Other

## 2015-12-26 ENCOUNTER — Inpatient Hospital Stay: Payer: Medicare Other

## 2015-12-27 ENCOUNTER — Inpatient Hospital Stay: Payer: Medicare Other | Attending: Hematology and Oncology

## 2015-12-27 DIAGNOSIS — C180 Malignant neoplasm of cecum: Secondary | ICD-10-CM | POA: Insufficient documentation

## 2015-12-27 DIAGNOSIS — Z452 Encounter for adjustment and management of vascular access device: Secondary | ICD-10-CM | POA: Diagnosis not present

## 2015-12-27 DIAGNOSIS — Z95828 Presence of other vascular implants and grafts: Secondary | ICD-10-CM

## 2015-12-27 MED ORDER — HEPARIN SOD (PORK) LOCK FLUSH 100 UNIT/ML IV SOLN
500.0000 [IU] | Freq: Once | INTRAVENOUS | Status: AC
  Administered 2015-12-27: 500 [IU] via INTRAVENOUS

## 2015-12-27 MED ORDER — SODIUM CHLORIDE 0.9% FLUSH
10.0000 mL | INTRAVENOUS | Status: DC | PRN
  Administered 2015-12-27: 10 mL via INTRAVENOUS
  Filled 2015-12-27: qty 10

## 2016-02-06 ENCOUNTER — Inpatient Hospital Stay: Payer: Medicare Other

## 2016-02-06 ENCOUNTER — Other Ambulatory Visit: Payer: Self-pay | Admitting: *Deleted

## 2016-02-06 ENCOUNTER — Inpatient Hospital Stay: Payer: Medicare Other | Attending: Hematology and Oncology

## 2016-02-06 ENCOUNTER — Inpatient Hospital Stay (HOSPITAL_BASED_OUTPATIENT_CLINIC_OR_DEPARTMENT_OTHER): Payer: Medicare Other | Admitting: Hematology and Oncology

## 2016-02-06 ENCOUNTER — Encounter: Payer: Self-pay | Admitting: Hematology and Oncology

## 2016-02-06 VITALS — BP 127/69 | HR 92 | Temp 96.0°F | Wt 153.4 lb

## 2016-02-06 DIAGNOSIS — D751 Secondary polycythemia: Secondary | ICD-10-CM

## 2016-02-06 DIAGNOSIS — C182 Malignant neoplasm of ascending colon: Secondary | ICD-10-CM

## 2016-02-06 DIAGNOSIS — Z79899 Other long term (current) drug therapy: Secondary | ICD-10-CM | POA: Insufficient documentation

## 2016-02-06 DIAGNOSIS — Z9221 Personal history of antineoplastic chemotherapy: Secondary | ICD-10-CM | POA: Diagnosis not present

## 2016-02-06 DIAGNOSIS — C180 Malignant neoplasm of cecum: Secondary | ICD-10-CM

## 2016-02-06 DIAGNOSIS — I10 Essential (primary) hypertension: Secondary | ICD-10-CM | POA: Diagnosis not present

## 2016-02-06 DIAGNOSIS — F17210 Nicotine dependence, cigarettes, uncomplicated: Secondary | ICD-10-CM | POA: Insufficient documentation

## 2016-02-06 LAB — COMPREHENSIVE METABOLIC PANEL
ALT: 12 U/L — ABNORMAL LOW (ref 17–63)
AST: 19 U/L (ref 15–41)
Albumin: 4.1 g/dL (ref 3.5–5.0)
Alkaline Phosphatase: 92 U/L (ref 38–126)
Anion gap: 8 (ref 5–15)
BUN: 5 mg/dL — ABNORMAL LOW (ref 6–20)
CO2: 24 mmol/L (ref 22–32)
Calcium: 9.4 mg/dL (ref 8.9–10.3)
Chloride: 106 mmol/L (ref 101–111)
Creatinine, Ser: 0.89 mg/dL (ref 0.61–1.24)
GFR calc Af Amer: >60 mL/min (ref >60)
GFR calc non Af Amer: >60 mL/min (ref >60)
Glucose, Bld: 105 mg/dL — ABNORMAL HIGH (ref 65–99)
Potassium: 3.6 mmol/L (ref 3.5–5.1)
Sodium: 138 mmol/L (ref 135–145)
Total Bilirubin: 0.8 mg/dL (ref 0.3–1.2)
Total Protein: 7.9 g/dL (ref 6.5–8.1)

## 2016-02-06 LAB — CBC WITH DIFFERENTIAL/PLATELET
Basophils Absolute: 0.1 10*3/uL (ref 0–0.1)
Basophils Relative: 1 %
Eosinophils Absolute: 0.1 10*3/uL (ref 0–0.7)
Eosinophils Relative: 1 %
HCT: 54.2 % — ABNORMAL HIGH (ref 40.0–52.0)
Hemoglobin: 18.2 g/dL — ABNORMAL HIGH (ref 13.0–18.0)
Lymphocytes Relative: 29 %
Lymphs Abs: 2.4 10*3/uL (ref 1.0–3.6)
MCH: 32.3 pg (ref 26.0–34.0)
MCHC: 33.6 g/dL (ref 32.0–36.0)
MCV: 95.9 fL (ref 80.0–100.0)
Monocytes Absolute: 0.5 10*3/uL (ref 0.2–1.0)
Monocytes Relative: 6 %
Neutro Abs: 5 10*3/uL (ref 1.4–6.5)
Neutrophils Relative %: 63 %
Platelets: 201 10*3/uL (ref 150–440)
RBC: 5.66 MIL/uL (ref 4.40–5.90)
RDW: 14.7 % — ABNORMAL HIGH (ref 11.5–14.5)
WBC: 8.1 10*3/uL (ref 3.8–10.6)

## 2016-02-06 MED ORDER — HEPARIN SOD (PORK) LOCK FLUSH 100 UNIT/ML IV SOLN
500.0000 [IU] | Freq: Once | INTRAVENOUS | Status: AC
  Administered 2016-02-06: 500 [IU] via INTRAVENOUS

## 2016-02-06 MED ORDER — SODIUM CHLORIDE 0.9% FLUSH
10.0000 mL | INTRAVENOUS | Status: DC | PRN
  Administered 2016-02-06: 10 mL via INTRAVENOUS
  Filled 2016-02-06: qty 10

## 2016-02-06 NOTE — Progress Notes (Signed)
[facility] day:  02/06/2016   Chief Complaint: William Ford is a 65 y.o. male with stage IIB colon cancer and polycythemia who is seen for 3 month assessment.  HPI: The patient was last seen in the medical oncology clinic on 11/07/2015.  At that time,  he was doing well.  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  He denied any melena or hematochezia.  He denied any respiratory symptoms.  He was smoking 1/2 pack a day.  Hematocrit was 52.1.  CEA was 8.8.  He underwent phlebotomy of 200 cc.  During the interim, he has had "no problems".  He continues to smoke 1/2 pack per day as he "enjoys cigarettes".  He would like to quit.  He denies any abdominal symptoms.  He denies any melena or hematochezia.  He notes that his last colonoscopy was this year (unsure of provider).  He states that when his hematocrit is up, he feels drowsy and sluggish.   Past Medical History:  Diagnosis Date  . Cancer ([place])   . Hypertension     Past Surgical History:  Procedure Laterality Date  . COLON SURGERY  2013    Family History  Problem Relation Age of Onset  . Cancer Maternal Grandfather     Social History:  reports that he has been smoking Cigarettes.  He has been smoking about 0.50 packs per day. He has never used smokeless tobacco. He reports that he does not drink alcohol or use drugs.  He started smoking at age 64.  He has stopped smoking several times.  He is currently smoking 1/2 pack/day.  He lives in [place].  The patient is alone today.  Allergies: No Known Allergies  Current Medications: Current Outpatient Prescriptions  Medication Sig Dispense Refill  . amLODipine-benazepril (LOTREL) 10-40 MG per capsule Take 1 capsule by mouth daily.   0  . CELEBREX 200 MG capsule Take 200 mg by mouth daily.   0  . gabapentin (NEURONTIN) 300 MG capsule Take 1 capsule by mouth daily.   0  . L-Methylfolate-Algae-B12-B6 (FOLTANX RF)  3-90.314-2-35 MG CAPS   0  . LYRICA 75 MG capsule take 1 capsule by mouth twice a day 60 capsule 3  . sildenafil (REVATIO) 20 MG tablet Take 2 tablets by mouth as needed.  0   No current facility-administered medications for this visit.    Facility-Administered Medications Ordered in Other Visits  Medication Dose Route Frequency Provider Last Rate Last Dose  . sodium chloride 0.9 % injection 10 mL  10 mL Intravenous PRN Lequita Asal, MD   10 mL at 12/13/14 1043  . sodium chloride flush (NS) 0.9 % injection 10 mL  10 mL Intravenous PRN Lequita Asal, MD   10 mL at 02/06/16 0959    Review of Systems:  GENERAL:  Feels "fine".  No fevers or sweats.  Weight down 4 pounds. PERFORMANCE STATUS (ECOG):  0 HEENT:  No visual changes, runny nose, sore throat, mouth sores or tenderness. Lungs: No shortness of breath or cough.  No hemoptysis. Cardiac:  No chest pain, palpitations, orthopnea, or PND. GI:  No nausea, vomiting, diarrhea, constipation, melena or hematochezia. GU:  No urgency, frequency, dysuria, or hematuria. Musculoskeletal:  No back pain.  No joint pain.  No muscle tenderness. Extremities:  No pain or swelling. Skin:  No rashes or skin changes. Neuro:  Oxaliplatin neuropathy (stable).  No headache, weakness, balance  or coordination issues. Endocrine:  No diabetes, thyroid issues, hot flashes or night sweats. Psych:  No mood changes, depression or anxiety. Pain:  No focal pain. Review of systems:  All other systems reviewed and found to be negative.  Physical Exam: Blood pressure 127/69, pulse 92, temperature (!) 96 F (35.6 C), temperature source Tympanic, weight 153 lb 7 oz (69.6 kg). GENERAL:  Well developed, well nourished, gentleman sitting comfortably in the exam room in no acute distress. MENTAL STATUS:  Alert and oriented to person, place and time. HEAD:  Wearing a black cap.  Alopecia.  Goatee.  Normocephalic, atraumatic, face symmetric, no Cushingoid  features. EYES:  Glasses.  Brown eyes.  Pupils equal round and reactive to light and accomodation.  No conjunctivitis or scleral icterus. ENT:  Oropharynx clear without lesion.  Tongue normal. Mucous membranes moist.  RESPIRATORY:  Clear to auscultation without rales, wheezes or rhonchi. CARDIOVASCULAR:  Regular rate and rhythm without murmur, rub or gallop. CHEST WALL:  Port-a-cath accessed. ABDOMEN:  Soft, non-tender, with active bowel sounds, and no hepatosplenomegaly.  No masses. SKIN:  No rashes, ulcers or lesions. EXTREMITIES: Clubbing.  No edema, no skin discoloration or tenderness.  No palpable cords. LYMPH NODES: No palpable cervical, supraclavicular, axillary or inguinal adenopathy  NEUROLOGICAL: Unremarkable. PSYCH:  Appropriate.   Infusion on 02/06/2016  Component Date Value Ref Range Status  . WBC 02/06/2016 8.1  3.8 - 10.6 K/uL Final  . RBC 02/06/2016 5.66  4.40 - 5.90 MIL/uL Final  . Hemoglobin 02/06/2016 18.2* 13.0 - 18.0 g/dL Final  . HCT 02/06/2016 54.2* 40.0 - 52.0 % Final  . MCV 02/06/2016 95.9  80.0 - 100.0 fL Final  . MCH 02/06/2016 32.3  26.0 - 34.0 pg Final  . MCHC 02/06/2016 33.6  32.0 - 36.0 g/dL Final  . RDW 02/06/2016 14.7* 11.5 - 14.5 % Final  . Platelets 02/06/2016 201  150 - 440 K/uL Final  . Neutrophils Relative % 02/06/2016 63  % Final  . Lymphocytes Relative 02/06/2016 29  % Final  . Monocytes Relative 02/06/2016 6  % Final  . Eosinophils Relative 02/06/2016 1  % Final  . Basophils Relative 02/06/2016 1  % Final  . Neutro Abs 02/06/2016 5.0  1.4 - 6.5 K/uL Final  . Lymphs Abs 02/06/2016 2.4  1.0 - 3.6 K/uL Final  . Monocytes Absolute 02/06/2016 0.5  0.2 - 1.0 K/uL Final  . Eosinophils Absolute 02/06/2016 0.1  0 - 0.7 K/uL Final  . Basophils Absolute 02/06/2016 0.1  0 - 0.1 K/uL Final  . Sodium 02/06/2016 138  135 - 145 mmol/L Final  . Potassium 02/06/2016 3.6  3.5 - 5.1 mmol/L Final  . Chloride 02/06/2016 106  101 - 111 mmol/L Final  . CO2  02/06/2016 24  22 - 32 mmol/L Final  . Glucose, Bld 02/06/2016 105* 65 - 99 mg/dL Final  . BUN 02/06/2016 5* 6 - 20 mg/dL Final  . Creatinine, Ser 02/06/2016 0.89  0.61 - 1.24 mg/dL Final  . Calcium 02/06/2016 9.4  8.9 - 10.3 mg/dL Final  . Total Protein 02/06/2016 7.9  6.5 - 8.1 g/dL Final  . Albumin 02/06/2016 4.1  3.5 - 5.0 g/dL Final  . AST 02/06/2016 19  15 - 41 U/L Final  . ALT 02/06/2016 12* 17 - 63 U/L Final  . Alkaline Phosphatase 02/06/2016 92  38 - 126 U/L Final  . Total Bilirubin 02/06/2016 0.8  0.3 - 1.2 mg/dL Final  . GFR calc non Af  Amer 02/06/2016 >60  >60 mL/min Final  . GFR calc Af Amer 02/06/2016 >60  >60 mL/min Final   Comment: (NOTE) The eGFR has been calculated using the CKD EPI equation. This calculation has not been validated in all clinical situations. eGFR's persistently <60 mL/min signify possible Chronic Kidney Disease.   . Anion gap 02/06/2016 8  5 - 15 Final    Assessment:  William Ford is a 65 y.o. male with stage IIB colon cancer.  He underwent right colectomy on 11/05/2012.  Pathology revealed a T3N0M0 tumor in the cecum with 17 negative lymph nodes.  Circumferential margin was positive.  There was lymphovascular invasion. He had 1 deposit of metastases in peritoneum.  Mismatch repair was positive.  He received 12 cycles of FOLFOX chemotherapy from 12/31/2012 - 08/19/2013.  Chemotherapy was interrupted because of small bowel obstruction due to adhesions in 05/2013.  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  He has had an elevated CEA with negative imaging studies.  CEA was 10.7 on 12/31/2012, 13.4 on 06/17/2013, 11.7 on 07/18/2013, 8.3 on 09/28/2013, 8.0 on 12/30/2013, 9.6 on 05/17/2014, 9.8 on 10/25/2014, 10.3 on 05/09/2015, 10.4 on 07/25/2015, 8.4 on 08/08/2015, 8.8 on 11/07/2015, and 8.3 on 02/06/2016.    Last colonoscopy was in 2017 (unknown provider).   Chest, abdomen, and pelvic CT scan on 07/03/2014 revealed no evidence of  metastatic disease.  There was a new irregular shaped pleural based pulmonary nodule in the periphery of the RUL most likely infectious/inflammatory. 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    Chest CT on 10/20/2014 revealed a stablle small pleural-based area of nodularity in the right upper lobe (favor benign area of scarring). There was mild diffuse bronchial wall thickening with moderate centrilobular and mild paraseptal emphysema; imaging findings suggestive of underlying COPD.  Chest CT on 07/19/2015 revealed no acute cardiopulmonary abnormalities.  He has a peripheral scar like density within the right upper lobe.   He has secondary polycythemia secondary to smoking. Work-up on 07/25/2015 revealed a hematocrit of 57.2, hemoglobin 19.2, MCV 95.1, platelets 169,000, WBC 8300 with an ANC of 4900.  Erythropoietin level was 6.0 (2.6-18.5).  Testosterone level was 680.  JAK2 was negative for V617F and exon 12.  Ferritin was 90, iron saturation 24% , and TIBC 372.    He undergoes small volume phlebotomies (200-250 cc) to maintain a hematocrit < 52 and a hemoglobin < 18.  With an elevated hematocrit, he feels sluggish/drowsy. He underwent phlebotomy (500 cc) on 07/25/2015 with subsequent fatigue.  Last phlebotomy was on 11/07/2015.  Symptomatically, he denies any respiratory symptoms.  He smokes 1/2 pack a day.  Hematocrit is 54.2.  Plan: 1.  Labs today:  CBC with diff, CMP, CEA. 2.  Discuss hematocrit and need for small volume phlebotomy today.  Encourage smoking cessation. 3.  Discuss last colonoscopy.  Patient states "this year" (no records available in computer).  Patient to provide information. 4.  Discuss prior CEAs.  Discuss minimum of yearly CT scans unless significant increase in CEA.  Discuss plan for follow-up CT scan this year. 5.  Phlebotomy (250 cc) today. 6.  Schedule chest, abdomen, and pelvic CT scan. 7.  Port flush every 6-8 weeks. 8.  RTC in 1 month for MD assess, labs  (CBC), review scans, and +/- phlebotomy.   Lequita Asal, MD  02/06/2016, 1:58 PM

## 2016-02-07 LAB — CEA: CEA: 8.3 ng/mL — ABNORMAL HIGH (ref 0.0–4.7)

## 2016-02-13 ENCOUNTER — Ambulatory Visit
Admission: RE | Admit: 2016-02-13 | Discharge: 2016-02-13 | Disposition: A | Discharge status: Home / Self Care | Payer: Medicare Other | Source: Ambulatory Visit | Attending: Hematology and Oncology | Admitting: Hematology and Oncology

## 2016-02-13 DIAGNOSIS — J432 Centrilobular emphysema: Secondary | ICD-10-CM | POA: Insufficient documentation

## 2016-02-13 DIAGNOSIS — K769 Liver disease, unspecified: Secondary | ICD-10-CM | POA: Diagnosis not present

## 2016-02-13 DIAGNOSIS — C182 Malignant neoplasm of ascending colon: Secondary | ICD-10-CM | POA: Insufficient documentation

## 2016-02-13 DIAGNOSIS — I708 Atherosclerosis of other arteries: Secondary | ICD-10-CM | POA: Diagnosis not present

## 2016-02-13 DIAGNOSIS — R911 Solitary pulmonary nodule: Secondary | ICD-10-CM | POA: Diagnosis not present

## 2016-02-13 DIAGNOSIS — I700 Atherosclerosis of aorta: Secondary | ICD-10-CM | POA: Insufficient documentation

## 2016-02-13 DIAGNOSIS — E279 Disorder of adrenal gland, unspecified: Secondary | ICD-10-CM | POA: Insufficient documentation

## 2016-02-13 DIAGNOSIS — M5137 Other intervertebral disc degeneration, lumbosacral region: Secondary | ICD-10-CM | POA: Insufficient documentation

## 2016-02-13 MED ORDER — IOPAMIDOL (ISOVUE-300) INJECTION 61%
100.0000 mL | Freq: Once | INTRAVENOUS | Status: AC | PRN
  Administered 2016-02-13: 100 mL via INTRAVENOUS

## 2016-03-05 ENCOUNTER — Inpatient Hospital Stay: Payer: Medicare Other

## 2016-03-05 ENCOUNTER — Inpatient Hospital Stay: Payer: Medicare Other | Attending: Hematology and Oncology | Admitting: Hematology and Oncology

## 2016-03-05 ENCOUNTER — Encounter: Payer: Self-pay | Admitting: Hematology and Oncology

## 2016-03-05 VITALS — BP 133/79 | HR 90 | Temp 96.9°F | Resp 18 | Wt 155.6 lb

## 2016-03-05 DIAGNOSIS — D751 Secondary polycythemia: Secondary | ICD-10-CM

## 2016-03-05 DIAGNOSIS — Z79899 Other long term (current) drug therapy: Secondary | ICD-10-CM | POA: Diagnosis not present

## 2016-03-05 DIAGNOSIS — Z9221 Personal history of antineoplastic chemotherapy: Secondary | ICD-10-CM | POA: Insufficient documentation

## 2016-03-05 DIAGNOSIS — C180 Malignant neoplasm of cecum: Secondary | ICD-10-CM

## 2016-03-05 DIAGNOSIS — F17210 Nicotine dependence, cigarettes, uncomplicated: Secondary | ICD-10-CM | POA: Diagnosis not present

## 2016-03-05 DIAGNOSIS — C182 Malignant neoplasm of ascending colon: Secondary | ICD-10-CM

## 2016-03-05 DIAGNOSIS — J984 Other disorders of lung: Secondary | ICD-10-CM | POA: Diagnosis not present

## 2016-03-05 DIAGNOSIS — I10 Essential (primary) hypertension: Secondary | ICD-10-CM | POA: Diagnosis not present

## 2016-03-05 DIAGNOSIS — G629 Polyneuropathy, unspecified: Secondary | ICD-10-CM | POA: Diagnosis not present

## 2016-03-05 DIAGNOSIS — R911 Solitary pulmonary nodule: Secondary | ICD-10-CM

## 2016-03-05 LAB — CBC WITH DIFFERENTIAL/PLATELET
Basophils Absolute: 0.1 10*3/uL (ref 0–0.1)
Basophils Relative: 1 %
Eosinophils Absolute: 0.1 10*3/uL (ref 0–0.7)
Eosinophils Relative: 1 %
HCT: 56.7 % — ABNORMAL HIGH (ref 40.0–52.0)
Hemoglobin: 18.7 g/dL — ABNORMAL HIGH (ref 13.0–18.0)
Lymphocytes Relative: 29 %
Lymphs Abs: 2.5 10*3/uL (ref 1.0–3.6)
MCH: 32.1 pg (ref 26.0–34.0)
MCHC: 33 g/dL (ref 32.0–36.0)
MCV: 97.1 fL (ref 80.0–100.0)
Monocytes Absolute: 0.6 10*3/uL (ref 0.2–1.0)
Monocytes Relative: 7 %
Neutro Abs: 5.4 10*3/uL (ref 1.4–6.5)
Neutrophils Relative %: 62 %
Platelets: 174 10*3/uL (ref 150–440)
RBC: 5.84 MIL/uL (ref 4.40–5.90)
RDW: 14.9 % — ABNORMAL HIGH (ref 11.5–14.5)
WBC: 8.7 10*3/uL (ref 3.8–10.6)

## 2016-03-05 MED ORDER — SODIUM CHLORIDE 0.9% FLUSH
10.0000 mL | INTRAVENOUS | Status: DC | PRN
  Administered 2016-03-05: 10 mL via INTRAVENOUS
  Filled 2016-03-05: qty 10

## 2016-03-05 MED ORDER — HEPARIN SOD (PORK) LOCK FLUSH 100 UNIT/ML IV SOLN
500.0000 [IU] | Freq: Once | INTRAVENOUS | Status: AC
  Administered 2016-03-05: 500 [IU] via INTRAVENOUS

## 2016-03-05 NOTE — Progress Notes (Signed)
[facility] day:  03/05/2016   Chief Complaint: William Ford is a 65 y.o. male with stage IIB colon cancer and polycythemia who is seen for review of interval scans and 1 month assessment with consideration of phlebotomy.  HPI: The patient was last seen in the medical oncology clinic on 02/06/2016.  At that time,  He denied any complaint.  He continued to smoke.  Hematocrit was 54.2 and hemoglobin 18.2.  CEA was 8.3.  He underwent a 250 cc phlebotomy.  Scans were scheduled.  Chest, abdomen, and pelvic CT scan on 02/13/2016 revealed a faint 5 mm focus of enhancement anteriorly in the left hepatic lobe not readily seen previously, quite likely a benign lesion given the small size and indistinctness, but meriting attention on follow up.  There was a 3 mm right upper lobe pulmonary nodule which was not readily apparent on 07/03/2014, and merits surveillance.  There was no adenopathy.  There was a small right adrenal nodule, minimally larger than on prior, technically nonspecific due to small size.  Symptomatically, he is doing well.  He voices no complaint.  He states that he has been more active and has done more since his last phlebotomy.  He cut back his smoking during the interim.  He states that he stopped smoking today.   Past Medical History:  Diagnosis Date  . Cancer ([place])   . Hypertension     Past Surgical History:  Procedure Laterality Date  . COLON SURGERY  2013    Family History  Problem Relation Age of Onset  . Cancer Maternal Grandfather     Social History:  reports that he has been smoking Cigarettes.  He has been smoking about 0.50 packs per day. He has never used smokeless tobacco. He reports that he does not drink alcohol or use drugs.  He started smoking at age 21.  He has stopped smoking several times.  He was smoking 1/2 pack/day.  He has cut back over the past month.  Today is his last day of smoking.  [place]  The patient is alone today.  Allergies: No Known Allergies  Current Medications: Current Outpatient Prescriptions  Medication Sig Dispense Refill  . amLODipine-benazepril (LOTREL) 10-40 MG per capsule Take 1 capsule by mouth daily.   0  . CELEBREX 200 MG capsule Take 200 mg by mouth daily.   0  . gabapentin (NEURONTIN) 300 MG capsule Take 1 capsule by mouth daily.   0  . L-Methylfolate-Algae-B12-B6 (FOLTANX RF) 3-90.314-2-35 MG CAPS   0  . LYRICA 75 MG capsule take 1 capsule by mouth twice a day 60 capsule 3  . sildenafil (REVATIO) 20 MG tablet Take 2 tablets by mouth as needed.  0   No current facility-administered medications for this visit.    Facility-Administered Medications Ordered in Other Visits  Medication Dose Route Frequency Provider Last Rate Last Dose  . sodium chloride 0.9 % injection 10 mL  10 mL Intravenous PRN Lequita Asal, MD   10 mL at 12/13/14 1043    Review of Systems:  GENERAL:  Feels good.  More energy.  No fevers or sweats.  Weight up 2 pounds. PERFORMANCE STATUS (ECOG):  0 HEENT:  No visual changes, runny nose, sore throat, mouth sores or tenderness. Lungs: No shortness of breath or cough.  No hemoptysis. Cardiac:  No chest pain, palpitations, orthopnea, or PND. GI:  No nausea, vomiting, diarrhea, constipation, melena or  hematochezia. GU:  No urgency, frequency, dysuria, or hematuria. Musculoskeletal:  No back pain.  No joint pain.  No muscle tenderness. Extremities:  No pain or swelling. Skin:  No rashes or skin changes. Neuro:  Oxaliplatin neuropathy (stable).  No headache, weakness, balance or coordination issues. Endocrine:  No diabetes, thyroid issues, hot flashes or night sweats. Psych:  No mood changes, depression or anxiety. Pain:  No focal pain. Review of systems:  All other systems reviewed and found to be negative.  Physical Exam: Blood pressure 133/79, pulse 90, temperature (!) 96.9 F (36.1 C), temperature source  Tympanic, resp. rate 18, weight 155 lb 10.3 oz (70.6 kg). GENERAL:  Well developed, well nourished, gentleman sitting comfortably in the exam room in no acute distress. MENTAL STATUS:  Alert and oriented to person, place and time. HEAD:  Wearing a black golf cap.  Alopecia.  Goatee.  Normocephalic, atraumatic, face symmetric, no Cushingoid features. EYES:  Glasses.  Brown eyes.  Pupils equal round and reactive to light and accomodation.  No conjunctivitis or scleral icterus. ENT:  Oropharynx clear without lesion.  Tongue normal. Mucous membranes moist.  RESPIRATORY:  Clear to auscultation without rales, wheezes or rhonchi. CARDIOVASCULAR:  Regular rate and rhythm without murmur, rub or gallop. ABDOMEN:  Soft, non-tender, with active bowel sounds, and no hepatosplenomegaly.  No masses. SKIN:  No rashes, ulcers or lesions. EXTREMITIES: Clubbing.  No edema, no skin discoloration or tenderness.  No palpable cords. LYMPH NODES: No palpable cervical, supraclavicular, axillary or inguinal adenopathy  NEUROLOGICAL: Unremarkable. PSYCH:  Appropriate.   Appointment on 03/05/2016  Component Date Value Ref Range Status  . WBC 03/05/2016 8.7  3.8 - 10.6 K/uL Final  . RBC 03/05/2016 5.84  4.40 - 5.90 MIL/uL Final  . Hemoglobin 03/05/2016 18.7* 13.0 - 18.0 g/dL Final  . HCT 03/05/2016 56.7* 40.0 - 52.0 % Final  . MCV 03/05/2016 97.1  80.0 - 100.0 fL Final  . MCH 03/05/2016 32.1  26.0 - 34.0 pg Final  . MCHC 03/05/2016 33.0  32.0 - 36.0 g/dL Final  . RDW 03/05/2016 14.9* 11.5 - 14.5 % Final  . Platelets 03/05/2016 174  150 - 440 K/uL Final  . Neutrophils Relative % 03/05/2016 PENDING  % Incomplete  . Neutro Abs 03/05/2016 PENDING  1.7 - 7.7 K/uL Incomplete  . Band Neutrophils 03/05/2016 PENDING  % Incomplete  . Lymphocytes Relative 03/05/2016 PENDING  % Incomplete  . Lymphs Abs 03/05/2016 PENDING  0.7 - 4.0 K/uL Incomplete  . Monocytes Relative 03/05/2016 PENDING  % Incomplete  . Monocytes Absolute  03/05/2016 PENDING  0.1 - 1.0 K/uL Incomplete  . Eosinophils Relative 03/05/2016 PENDING  % Incomplete  . Eosinophils Absolute 03/05/2016 PENDING  0.0 - 0.7 K/uL Incomplete  . Basophils Relative 03/05/2016 PENDING  % Incomplete  . Basophils Absolute 03/05/2016 PENDING  0.0 - 0.1 K/uL Incomplete  . WBC Morphology 03/05/2016 PENDING   Incomplete  . RBC Morphology 03/05/2016 PENDING   Incomplete  . Smear Review 03/05/2016 PENDING   Incomplete  . Other 03/05/2016 PENDING  % Incomplete  . nRBC 03/05/2016 PENDING  0 /100 WBC Incomplete  . Metamyelocytes Relative 03/05/2016 PENDING  % Incomplete  . Myelocytes 03/05/2016 PENDING  % Incomplete  . Promyelocytes Absolute 03/05/2016 PENDING  % Incomplete  . Blasts 03/05/2016 PENDING  % Incomplete    Assessment:  William Ford is a 65 y.o. male with stage IIB colon cancer.  He underwent right colectomy on 11/05/2012.  Pathology revealed a T3N0M0 tumor  in the cecum with 17 negative lymph nodes.  Circumferential margin was positive.  There was lymphovascular invasion. He had 1 deposit of metastases in peritoneum.  Mismatch repair was positive.  He received 12 cycles of FOLFOX chemotherapy from 12/31/2012 - 08/19/2013.  Chemotherapy was interrupted because of small bowel obstruction due to adhesions in 05/2013.  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  He has had an elevated CEA with negative imaging studies.  CEA was 10.7 on 12/31/2012, 13.4 on 06/17/2013, 11.7 on 07/18/2013, 8.3 on 09/28/2013, 8.0 on 12/30/2013, 9.6 on 05/17/2014, 9.8 on 10/25/2014, 10.3 on 05/09/2015, 10.4 on 07/25/2015, 8.4 on 08/08/2015, 8.8 on 11/07/2015, and 8.3 on 02/06/2016.    Last colonoscopy was in 2017 (unknown provider).   Chest, abdomen, and pelvic CT scan on 07/03/2014 revealed no evidence of metastatic disease.  There was a new irregular shaped pleural based pulmonary nodule in the periphery of the RUL most likely infectious/inflammatory. There were  stable multinodular adrenal glands.    Chest CT on 10/20/2014 revealed a stable small pleural-based area of nodularity in the right upper lobe (favor benign area of scarring). There was mild diffuse bronchial wall thickening with moderate centrilobular and mild paraseptal emphysema; imaging findings suggestive of underlying COPD.  Chest CT on 07/19/2015 revealed no acute cardiopulmonary abnormalities.  He has a peripheral scar like density within the right upper lobe.   He has secondary polycythemia secondary to smoking. Work-up on 07/25/2015 revealed a hematocrit of 57.2, hemoglobin 19.2, MCV 95.1, platelets 169,000, WBC 8300 with an ANC of 4900.  Erythropoietin level was 6.0 (2.6-18.5).  Testosterone level was 680.  JAK2 was negative for V617F and exon 12.  Ferritin was 90, iron saturation 24% , and TIBC 372.    He undergoes small volume phlebotomies (200-250 cc) to maintain a hematocrit < 52 and a hemoglobin < 18.  With an elevated hematocrit, he feels sluggish/drowsy. He underwent phlebotomy (500 cc) on 07/25/2015 with subsequent fatigue.  Last phlebotomy was on 02/06/2016.  Symptomatically, he has felt better since his last phlebotomy.  He has cut back on his smoking and plans to quit today.  Hematocrit is 56.7.  Plan: 1.  Labs today:  CBC. 2.  Discuss labs and scans since last visit.  Given tiny questionable lesions noted on scans, stable elevated CEA, and no symptoms, discuss continued close surveillance. 3.  Follow-up last colonoscopy (2017 per patient) 4.  Phlebotomy (250 cc) today. 5.  Port flush every 6-8 weeks. 7.  RTC monthly x 5 for Hgb +/- phlebotomy. 8.  Add CEA in 3 months. 9.  RTC in 6 months for MD assess, labs (CBC, CMP, and CEA), and +/- phlebotomy.   Lequita Asal, MD  03/05/2016, 10:01 AM

## 2016-03-05 NOTE — Addendum Note (Signed)
Addended by: Mallie Snooks I on: 03/05/2016 11:13 AM   Modules accepted: Orders

## 2016-03-05 NOTE — Progress Notes (Signed)
Patient offers no complaints today. 

## 2016-03-19 ENCOUNTER — Inpatient Hospital Stay: Payer: Medicare Other

## 2016-04-02 ENCOUNTER — Other Ambulatory Visit: Payer: Self-pay | Admitting: Hematology and Oncology

## 2016-04-02 ENCOUNTER — Inpatient Hospital Stay: Payer: Medicare Other

## 2016-04-02 ENCOUNTER — Inpatient Hospital Stay: Payer: Medicare Other | Attending: Hematology and Oncology

## 2016-04-02 VITALS — BP 112/66 | HR 76 | Temp 98.4°F | Resp 18

## 2016-04-02 DIAGNOSIS — Z452 Encounter for adjustment and management of vascular access device: Secondary | ICD-10-CM | POA: Diagnosis not present

## 2016-04-02 DIAGNOSIS — C180 Malignant neoplasm of cecum: Secondary | ICD-10-CM | POA: Insufficient documentation

## 2016-04-02 DIAGNOSIS — Z95828 Presence of other vascular implants and grafts: Secondary | ICD-10-CM

## 2016-04-02 DIAGNOSIS — D751 Secondary polycythemia: Secondary | ICD-10-CM

## 2016-04-02 DIAGNOSIS — R911 Solitary pulmonary nodule: Secondary | ICD-10-CM

## 2016-04-02 DIAGNOSIS — C182 Malignant neoplasm of ascending colon: Secondary | ICD-10-CM

## 2016-04-02 LAB — HEMOGLOBIN: Hemoglobin: 18.6 g/dL — ABNORMAL HIGH (ref 13.0–18.0)

## 2016-04-02 MED ORDER — SODIUM CHLORIDE 0.9% FLUSH
10.0000 mL | INTRAVENOUS | Status: DC | PRN
  Administered 2016-04-02: 10 mL via INTRAVENOUS
  Filled 2016-04-02: qty 10

## 2016-04-02 MED ORDER — HEPARIN SOD (PORK) LOCK FLUSH 100 UNIT/ML IV SOLN
500.0000 [IU] | Freq: Once | INTRAVENOUS | Status: AC
  Administered 2016-04-02: 500 [IU] via INTRAVENOUS

## 2016-04-02 MED ORDER — HEPARIN SOD (PORK) LOCK FLUSH 100 UNIT/ML IV SOLN
INTRAVENOUS | Status: AC
  Filled 2016-04-02: qty 5

## 2016-04-02 NOTE — Patient Instructions (Signed)
     Therapeutic Phlebotomy, Care After Refer to this sheet in the next few weeks. These instructions provide you with information about caring for yourself after your procedure. Your health care provider may also give you more specific instructions. Your treatment has been planned according to current medical practices, but problems sometimes occur. Call your health care provider if you have any problems or questions after your procedure. What can I expect after the procedure? After the procedure, it is common to have:  Light-headedness or dizziness. You may feel faint.  Nausea.  Tiredness. Follow these instructions at home: Activity  Return to your normal activities as directed by your health care provider. Most people can go back to their normal activities right away.  Avoid strenuous physical activity and heavy lifting or pulling for about 5 hours after the procedure. Do not lift anything that is heavier than 10 lb (4.5 kg).  Athletes should avoid strenuous exercise for at least 12 hours.  Change positions slowly for the remainder of the day. This will help to prevent light-headedness or fainting.  If you feel light-headed, lie down until the feeling goes away. Eating and drinking  Be sure to eat well-balanced meals for the next 24 hours.  Drink enough fluid to keep your urine clear or pale yellow.  Avoid drinking alcohol on the day that you had the procedure. Care of the Needle Insertion Site  Keep your bandage dry. You can remove the bandage after about 5 hours or as directed by your health care provider.  If you have bleeding from the needle insertion site, elevate your arm and press firmly on the site until the bleeding stops.  If you have bruising at the site, apply ice to the area:  Put ice in a plastic bag.  Place a towel between your skin and the bag.  Leave the ice on for 20 minutes, 2-3 times a day for the first 24 hours.  If the swelling does not go away  after 24 hours, apply a warm, moist washcloth to the area for 20 minutes, 2-3 times a day. General instructions  Avoid smoking for at least 30 minutes after the procedure.  Keep all follow-up visits as directed by your health care provider. It is important to continue with further therapeutic phlebotomy treatments as directed. Contact a health care provider if:  You have redness, swelling, or pain at the needle insertion site.  You have fluid, blood, or pus coming from the needle insertion site.  You feel light-headed, dizzy, or nauseated, and the feeling does not go away.  You notice new bruising at the needle insertion site.  You feel weaker than normal.  You have a fever or chills. Get help right away if:  You have severe nausea or vomiting.  You have chest pain.  You have trouble breathing. This information is not intended to replace advice given to you by your health care provider. Make sure you discuss any questions you have with your health care provider. Document Released: 08/19/2010 Document Revised: 11/17/2015 Document Reviewed: 03/13/2014 Elsevier Interactive Patient Education  2017 [employer]  

## 2016-04-30 ENCOUNTER — Inpatient Hospital Stay: Payer: Medicare Other

## 2016-04-30 ENCOUNTER — Other Ambulatory Visit: Payer: Medicare Other

## 2016-04-30 DIAGNOSIS — R911 Solitary pulmonary nodule: Secondary | ICD-10-CM

## 2016-04-30 DIAGNOSIS — D751 Secondary polycythemia: Secondary | ICD-10-CM

## 2016-04-30 DIAGNOSIS — C180 Malignant neoplasm of cecum: Secondary | ICD-10-CM | POA: Diagnosis not present

## 2016-04-30 DIAGNOSIS — C182 Malignant neoplasm of ascending colon: Secondary | ICD-10-CM

## 2016-04-30 LAB — HEMOGLOBIN: Hemoglobin: 17.9 g/dL (ref 13.0–18.0)

## 2016-04-30 MED ORDER — SODIUM CHLORIDE 0.9% FLUSH
10.0000 mL | INTRAVENOUS | Status: DC | PRN
  Administered 2016-04-30: 10 mL via INTRAVENOUS
  Filled 2016-04-30: qty 10

## 2016-04-30 MED ORDER — HEPARIN SOD (PORK) LOCK FLUSH 100 UNIT/ML IV SOLN
500.0000 [IU] | Freq: Once | INTRAVENOUS | Status: AC
  Administered 2016-04-30: 500 [IU] via INTRAVENOUS

## 2016-05-28 ENCOUNTER — Inpatient Hospital Stay: Payer: Medicare Other | Attending: Hematology and Oncology

## 2016-05-28 ENCOUNTER — Inpatient Hospital Stay: Payer: Medicare Other

## 2016-05-28 ENCOUNTER — Other Ambulatory Visit: Payer: Self-pay | Admitting: Hematology and Oncology

## 2016-05-28 VITALS — BP 148/83 | HR 71 | Temp 96.6°F | Resp 18

## 2016-05-28 DIAGNOSIS — C180 Malignant neoplasm of cecum: Secondary | ICD-10-CM | POA: Diagnosis present

## 2016-05-28 DIAGNOSIS — R911 Solitary pulmonary nodule: Secondary | ICD-10-CM

## 2016-05-28 DIAGNOSIS — C182 Malignant neoplasm of ascending colon: Secondary | ICD-10-CM

## 2016-05-28 DIAGNOSIS — Z95828 Presence of other vascular implants and grafts: Secondary | ICD-10-CM

## 2016-05-28 DIAGNOSIS — D751 Secondary polycythemia: Secondary | ICD-10-CM

## 2016-05-28 LAB — HEMOGLOBIN: Hemoglobin: 19.4 g/dL — ABNORMAL HIGH (ref 13.0–18.0)

## 2016-05-28 MED ORDER — SODIUM CHLORIDE 0.9% FLUSH
10.0000 mL | INTRAVENOUS | Status: DC | PRN
  Administered 2016-05-28: 10 mL via INTRAVENOUS
  Filled 2016-05-28: qty 10

## 2016-05-28 MED ORDER — HEPARIN SOD (PORK) LOCK FLUSH 100 UNIT/ML IV SOLN
500.0000 [IU] | Freq: Once | INTRAVENOUS | Status: AC
  Administered 2016-05-28: 500 [IU] via INTRAVENOUS
  Filled 2016-05-28: qty 5

## 2016-05-28 NOTE — Progress Notes (Signed)
Port accessed, flushes well. Very little blood return, about 3 ccs.  Peripheral 20 gauge IV placed for phlebotomy purposes.  Port heplocked without problems.

## 2016-05-29 LAB — CEA: CEA: 18.8 ng/mL — ABNORMAL HIGH (ref 0.0–4.7)

## 2016-06-11 ENCOUNTER — Inpatient Hospital Stay: Payer: Medicare Other

## 2016-06-25 ENCOUNTER — Inpatient Hospital Stay: Payer: Medicare Other

## 2016-06-25 ENCOUNTER — Inpatient Hospital Stay: Payer: Medicare Other | Attending: Hematology and Oncology

## 2016-06-25 DIAGNOSIS — R911 Solitary pulmonary nodule: Secondary | ICD-10-CM

## 2016-06-25 DIAGNOSIS — C180 Malignant neoplasm of cecum: Secondary | ICD-10-CM | POA: Insufficient documentation

## 2016-06-25 DIAGNOSIS — C182 Malignant neoplasm of ascending colon: Secondary | ICD-10-CM

## 2016-06-25 DIAGNOSIS — D751 Secondary polycythemia: Secondary | ICD-10-CM

## 2016-06-25 LAB — HEMOGLOBIN: Hemoglobin: 18.2 g/dL — ABNORMAL HIGH (ref 13.0–18.0)

## 2016-06-25 MED ORDER — HEPARIN SOD (PORK) LOCK FLUSH 100 UNIT/ML IV SOLN
INTRAVENOUS | Status: AC
  Filled 2016-06-25: qty 5

## 2016-06-25 MED ORDER — HEPARIN SOD (PORK) LOCK FLUSH 100 UNIT/ML IV SOLN
500.0000 [IU] | Freq: Once | INTRAVENOUS | Status: AC
  Administered 2016-06-25: 500 [IU] via INTRAVENOUS

## 2016-06-25 MED ORDER — SODIUM CHLORIDE 0.9% FLUSH
10.0000 mL | INTRAVENOUS | Status: DC | PRN
  Administered 2016-06-25: 10 mL via INTRAVENOUS
  Filled 2016-06-25: qty 10

## 2016-06-25 NOTE — Patient Instructions (Signed)

## 2016-06-30 ENCOUNTER — Telehealth: Payer: Self-pay | Admitting: *Deleted

## 2016-06-30 ENCOUNTER — Other Ambulatory Visit: Payer: Self-pay | Admitting: *Deleted

## 2016-06-30 DIAGNOSIS — C182 Malignant neoplasm of ascending colon: Secondary | ICD-10-CM

## 2016-06-30 NOTE — Telephone Encounter (Signed)
Called patient to inform him that his CEA has increased.  MD recommends CT of chest, abdomen and pelvis.  One of our schedulers will be contacting him with that appointment.

## 2016-07-01 ENCOUNTER — Other Ambulatory Visit: Payer: Self-pay | Admitting: *Deleted

## 2016-07-01 DIAGNOSIS — C182 Malignant neoplasm of ascending colon: Secondary | ICD-10-CM

## 2016-07-03 ENCOUNTER — Ambulatory Visit
Admission: RE | Admit: 2016-07-03 | Discharge: 2016-07-03 | Disposition: A | Discharge status: Home / Self Care | Payer: Medicare Other | Source: Ambulatory Visit | Attending: Oncology | Admitting: Oncology

## 2016-07-03 DIAGNOSIS — R911 Solitary pulmonary nodule: Secondary | ICD-10-CM | POA: Diagnosis not present

## 2016-07-03 DIAGNOSIS — J988 Other specified respiratory disorders: Secondary | ICD-10-CM | POA: Diagnosis not present

## 2016-07-03 DIAGNOSIS — C182 Malignant neoplasm of ascending colon: Secondary | ICD-10-CM | POA: Insufficient documentation

## 2016-07-03 DIAGNOSIS — N400 Enlarged prostate without lower urinary tract symptoms: Secondary | ICD-10-CM | POA: Insufficient documentation

## 2016-07-03 DIAGNOSIS — M47897 Other spondylosis, lumbosacral region: Secondary | ICD-10-CM | POA: Insufficient documentation

## 2016-07-03 DIAGNOSIS — M160 Bilateral primary osteoarthritis of hip: Secondary | ICD-10-CM | POA: Diagnosis not present

## 2016-07-03 DIAGNOSIS — I708 Atherosclerosis of other arteries: Secondary | ICD-10-CM | POA: Insufficient documentation

## 2016-07-03 DIAGNOSIS — I700 Atherosclerosis of aorta: Secondary | ICD-10-CM | POA: Diagnosis not present

## 2016-07-03 HISTORY — DX: Malignant neoplasm of colon, unspecified: C18.9

## 2016-07-03 LAB — POCT I-STAT CREATININE: Creatinine, Ser: 1.2 mg/dL (ref 0.61–1.24)

## 2016-07-03 MED ORDER — IOPAMIDOL (ISOVUE-300) INJECTION 61%
100.0000 mL | Freq: Once | INTRAVENOUS | Status: AC | PRN
  Administered 2016-07-03: 100 mL via INTRAVENOUS

## 2016-07-04 NOTE — Progress Notes (Signed)
Non urgent result on your patient - FYI

## 2016-07-06 ENCOUNTER — Other Ambulatory Visit: Payer: Self-pay | Admitting: Hematology and Oncology

## 2016-07-06 DIAGNOSIS — R932 Abnormal findings on diagnostic imaging of liver and biliary tract: Secondary | ICD-10-CM

## 2016-07-14 ENCOUNTER — Ambulatory Visit
Admission: RE | Admit: 2016-07-14 | Discharge: 2016-07-14 | Disposition: A | Discharge status: Home / Self Care | Payer: Medicare Other | Source: Ambulatory Visit | Attending: Hematology and Oncology | Admitting: Hematology and Oncology

## 2016-07-14 DIAGNOSIS — K8020 Calculus of gallbladder without cholecystitis without obstruction: Secondary | ICD-10-CM | POA: Insufficient documentation

## 2016-07-14 DIAGNOSIS — R932 Abnormal findings on diagnostic imaging of liver and biliary tract: Secondary | ICD-10-CM | POA: Diagnosis present

## 2016-07-16 ENCOUNTER — Encounter: Payer: Self-pay | Admitting: Hematology and Oncology

## 2016-07-16 ENCOUNTER — Inpatient Hospital Stay: Payer: Medicare Other | Attending: Hematology and Oncology | Admitting: Hematology and Oncology

## 2016-07-16 VITALS — BP 111/67 | HR 108 | Temp 97.3°F | Resp 18 | Wt 153.4 lb

## 2016-07-16 DIAGNOSIS — Z85038 Personal history of other malignant neoplasm of large intestine: Secondary | ICD-10-CM | POA: Diagnosis not present

## 2016-07-16 DIAGNOSIS — N400 Enlarged prostate without lower urinary tract symptoms: Secondary | ICD-10-CM | POA: Diagnosis not present

## 2016-07-16 DIAGNOSIS — F17210 Nicotine dependence, cigarettes, uncomplicated: Secondary | ICD-10-CM | POA: Insufficient documentation

## 2016-07-16 DIAGNOSIS — R911 Solitary pulmonary nodule: Secondary | ICD-10-CM

## 2016-07-16 DIAGNOSIS — K8020 Calculus of gallbladder without cholecystitis without obstruction: Secondary | ICD-10-CM | POA: Diagnosis not present

## 2016-07-16 DIAGNOSIS — Z79899 Other long term (current) drug therapy: Secondary | ICD-10-CM | POA: Diagnosis not present

## 2016-07-16 DIAGNOSIS — Z9049 Acquired absence of other specified parts of digestive tract: Secondary | ICD-10-CM | POA: Diagnosis not present

## 2016-07-16 DIAGNOSIS — K565 Intestinal adhesions [bands], unspecified as to partial versus complete obstruction: Secondary | ICD-10-CM | POA: Diagnosis not present

## 2016-07-16 DIAGNOSIS — I739 Peripheral vascular disease, unspecified: Secondary | ICD-10-CM | POA: Diagnosis not present

## 2016-07-16 DIAGNOSIS — I700 Atherosclerosis of aorta: Secondary | ICD-10-CM | POA: Insufficient documentation

## 2016-07-16 DIAGNOSIS — I10 Essential (primary) hypertension: Secondary | ICD-10-CM | POA: Insufficient documentation

## 2016-07-16 DIAGNOSIS — D751 Secondary polycythemia: Secondary | ICD-10-CM | POA: Diagnosis not present

## 2016-07-16 DIAGNOSIS — C182 Malignant neoplasm of ascending colon: Secondary | ICD-10-CM

## 2016-07-16 NOTE — Progress Notes (Addendum)
Start Clinic day:  07/16/2016   Chief Complaint: William Ford is a 66 y.o. male with stage IIB colon cancer and polycythemia who is seen for review of interval scans and 4 month assessment.  HPI: The patient was last seen in the medical oncology clinic on 03/05/2016.  At that time, he had felt better since his last phlebotomy.  He had cut back on his smoking and planned to quit that today.  Hematocrit was 56.7.  He underwent phlebotomy.    Hemaglobin has ranged between 17.9 - 19.4 during the interim.  He has undergone small volume phlebotomy (250 cc) if his hemoglobin has been > 18 (last 06/25/2016).  CEA was 18.8 on 05/28/2016.  He was contacted regarding reimaging studies.    Chest, abdomen, and pelvic CT scan on 07/03/2016 revealed slight enlargement of the right upper lobe subpleural pulmonary nodule (3 mm to 4.6 mm).  There was persistent irregularity of the gallbladder wall. This may represent sequela of chronic cholecystitis, however infiltrative process or malignancy cannot be excluded. There was a stable 12 mm right adrenal nodule.  There was a stable 4.2 cm left anterior chest wall water density subcutaneous lesion inferior to the left breast.  There was mild enlargement of the prostate gland. 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  Abdominal ultrasound on 07/14/2016 revealed cholelithiasis without evidence of cholecystitis.  Symptomatically, he has felt bloated with indigestion with some foods. He states his fingers good crooked-like Spock.  He didn't quit smoking. He is smoking less than a half less than a pack a day. He is talking about quitting with his brother (a challenge). He notes his leg muscles are tight with walking.   Past  Medical History:  Diagnosis Date  . Colon cancer ([place])   . Hypertension     Past Surgical History:  Procedure Laterality Date  . COLON SURGERY  2013    Family History  Problem Relation Age of Onset  . Cancer Maternal Grandfather     Social History:  reports that he has been smoking Cigarettes.  He has been smoking about 0.50 packs per day. He has never used smokeless tobacco. He reports that he does not drink alcohol or use drugs.  He started smoking at age 45.  He has stopped smoking several times.  He was smoking 1/2 pack/day.  He has cut back over the past month.  He lives in [place].  The patient is alone today.  Allergies: No Known Allergies  Current Medications: Current Outpatient Prescriptions  Medication Sig Dispense Refill  . amLODipine-benazepril (LOTREL) 5-20 MG capsule Take 1 capsule by mouth daily. Take one capsule every morning    . CELEBREX 200 MG capsule Take 200 mg by mouth daily.   0  . gabapentin (NEURONTIN) 300 MG capsule Take 1 capsule by mouth daily.   0  . L-Methylfolate-Algae-B12-B6 (FOLTANX RF) 3-90.314-2-35 MG CAPS   0  . LYRICA 75 MG capsule take 1 capsule by mouth twice a day 60 capsule 3  . sildenafil (REVATIO) 20 MG tablet Take 2 tablets by mouth as needed.  0   No current facility-administered medications for this visit.    Facility-Administered Medications Ordered in Other Visits  Medication Dose Route Frequency Provider Last Rate Last Dose  . sodium chloride 0.9 %  injection 10 mL  10 mL Intravenous PRN Lequita Asal, MD   10 mL at 12/13/14 1043    Review of Systems:  GENERAL:  Feels "ok".  No fevers or sweats.  Weight down 2 pounds. PERFORMANCE STATUS (ECOG):  0 HEENT:  No visual changes, runny nose, sore throat, mouth sores or tenderness. Lungs: No shortness of breath or cough.  No hemoptysis. Cardiac:  No chest pain, palpitations, orthopnea, or PND. GI:  Bloated.  Indigestion with some foods.  No nausea, vomiting, diarrhea,  constipation, melena or hematochezia. GU:  No urgency, frequency, dysuria, or hematuria. Musculoskeletal:  No back pain.  No joint pain.  No muscle tenderness. Extremities:  Leg muscles tight with walking.  No pain or swelling. Skin:  No rashes or skin changes. Neuro:  Oxaliplatin neuropathy (stable).  No headache, weakness, balance or coordination issues. Endocrine:  No diabetes, thyroid issues, hot flashes or night sweats. Psych:  No mood changes, depression or anxiety. Pain:  No focal pain. Review of systems:  All other systems reviewed and found to be negative.  Physical Exam: Blood pressure 111/67, pulse (!) 108, temperature 97.3 F (36.3 C), temperature source Tympanic, resp. rate 18, weight 153 lb 7 oz (69.6 kg). GENERAL:  Well developed, well nourished, gentleman sitting comfortably in the exam room in no acute distress. MENTAL STATUS:  Alert and oriented to person, place and time. HEAD:  Wearing a black golf cap.  Alopecia.  [name].  Normocephalic, atraumatic, face symmetric, no Cushingoid features. EYES:  Glasses.  Brown eyes.  Pupils equal round and reactive to light and accomodation.  No conjunctivitis or scleral icterus. ENT:  Oropharynx clear without lesion.  Tongue normal. Mucous membranes moist.  RESPIRATORY:  Clear to auscultation without rales, wheezes or rhonchi. CARDIOVASCULAR:  Regular rate and rhythm without murmur, rub or gallop. ABDOMEN:  Soft, non-tender, with active bowel sounds, and no hepatosplenomegaly.  No masses. SKIN:  3.5 cm left anterior chest wall cyst.  Left facial cyst.  No rashes, ulcers or lesions. EXTREMITIES: Clubbing.  No edema, no skin discoloration or tenderness.  No palpable cords. LYMPH NODES: No palpable cervical, supraclavicular, axillary or inguinal adenopathy  NEUROLOGICAL: Unremarkable. PSYCH:  Appropriate.   No visits with results within 3 Day(s) from this visit.  Latest known visit with results is:  Hospital Outpatient Visit on  07/03/2016  Component Date Value Ref Range Status  . Creatinine, Ser 07/03/2016 1.20  0.61 - 1.24 mg/dL Final    Assessment:  William Ford is a 66 y.o. male with stage IIB colon cancer.  He underwent right colectomy on 11/05/2012.  Pathology revealed a T3N0M0 tumor in the cecum with 17 negative lymph nodes.  Circumferential margin was positive.  There was lymphovascular invasion. He had 1 deposit of metastases in peritoneum.  Mismatch repair was positive.  He received 12 cycles of FOLFOX chemotherapy from 12/31/2012 - 08/19/2013.  Chemotherapy was interrupted because of small bowel obstruction due to adhesions in 05/2013.  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  He has had an elevated CEA with negative imaging studies.  CEA was 10.7 on 12/31/2012, 13.4 on 06/17/2013, 11.7 on 07/18/2013, 8.3 on 09/28/2013, 8.0 on 12/30/2013, 9.6 on 05/17/2014, 9.8 on 10/25/2014, 10.3 on 05/09/2015, 10.4 on 07/25/2015, 8.4 on 08/08/2015, 8.8 on 11/07/2015, 8.3 on 02/06/2016, and 18.8 on 05/28/2016.    Last colonoscopy was in 2017 (unknown provider).   Chest, abdomen, and pelvic CT scan on 07/03/2014 revealed no  evidence of metastatic disease.  There was a new irregular shaped pleural based pulmonary nodule in the periphery of the RUL most likely infectious/inflammatory. 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    Chest CT on 10/20/2014 revealed a stable small pleural-based area of nodularity in the right upper lobe (favor benign area of scarring). There was mild diffuse bronchial wall thickening with moderate centrilobular and mild paraseptal emphysema; imaging findings suggestive of underlying COPD.  Chest CT on 07/19/2015 revealed no acute cardiopulmonary abnormalities.  He has a peripheral scar like density within the right upper lobe.   Chest, abdomen, and pelvic CT on 07/03/2016 revealed slight enlargement of the right upper lobe subpleural pulmonary nodule (3 mm to 4.6 mm).  There  was persistent irregularity of the gallbladder wall. There was a stable 12 mm right adrenal nodule.  There was mild enlargement of the prostate gland. 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    Abdominal ultrasound on 07/14/2016 revealed cholelithiasis without evidence of cholecystitis.  He has secondary polycythemia secondary to smoking. Work-up on 07/25/2015 revealed a hematocrit of 57.2, hemoglobin 19.2, MCV 95.1, platelets 169,000, WBC 8300 with an ANC of 4900.  Erythropoietin level was 6.0 (2.6-18.5).  Testosterone level was 680.  JAK2 was negative for V617F and exon 12.  Ferritin was 90, iron saturation 24% , and TIBC 372.    He undergoes small volume phlebotomies (200-250 cc) to maintain a hematocrit < 52 and a hemoglobin < 18.  With an elevated hematocrit, he feels sluggish/drowsy. He underwent phlebotomy (500 cc) on 07/25/2015 with subsequent fatigue.  Last phlebotomy was on 06/25/2016.  Symptomatically, he notes indigestion with some foods.  He has cholelithiasis.  His legs feel tight with walking.  Imaging reveals near complete occlusion of the proximal superficial femoral arteries.  Hemoglobin was 18.2 on 06/25/2016.  He underwent phlebotomy.  Plan: 1.  Labs today: PSA. 2.  Review labs and imaging studies. Discuss stable scans with slight enlargement of RUL pulmonary nodule.  Discuss continued surveillance.  Discuss PSA check today for enlarged prostate. 3.  Discuss peripheral vascular disease.  Follow-up with PCP regarding referral to vascular surgery. 4.  Encourage smoking cessation.   5.  Port flush every 6-8 weeks. 6.  RTC as previously scheduled.   Lequita Asal, MD  07/16/2016, 10:08 AM

## 2016-07-16 NOTE — Progress Notes (Signed)
Patient states he sometimes gets pain in upper left arm.  Continues to have cramping of his hands.  Patient here for [place] results.

## 2016-07-23 ENCOUNTER — Inpatient Hospital Stay: Payer: Medicare Other

## 2016-07-23 DIAGNOSIS — C182 Malignant neoplasm of ascending colon: Secondary | ICD-10-CM

## 2016-07-23 DIAGNOSIS — Z85038 Personal history of other malignant neoplasm of large intestine: Secondary | ICD-10-CM | POA: Diagnosis not present

## 2016-07-23 DIAGNOSIS — D751 Secondary polycythemia: Secondary | ICD-10-CM

## 2016-07-23 DIAGNOSIS — R911 Solitary pulmonary nodule: Secondary | ICD-10-CM

## 2016-07-23 DIAGNOSIS — N400 Enlarged prostate without lower urinary tract symptoms: Secondary | ICD-10-CM

## 2016-07-23 LAB — PSA: PSA: 0.71 ng/mL (ref 0.00–4.00)

## 2016-07-23 LAB — HEMOGLOBIN: Hemoglobin: 17.6 g/dL (ref 13.0–18.0)

## 2016-08-20 ENCOUNTER — Other Ambulatory Visit: Payer: Self-pay | Admitting: *Deleted

## 2016-08-20 ENCOUNTER — Inpatient Hospital Stay: Payer: Medicare Other

## 2016-08-20 ENCOUNTER — Encounter: Payer: Self-pay | Admitting: Hematology and Oncology

## 2016-08-20 ENCOUNTER — Inpatient Hospital Stay: Payer: Medicare Other | Attending: Hematology and Oncology

## 2016-08-20 ENCOUNTER — Inpatient Hospital Stay (HOSPITAL_BASED_OUTPATIENT_CLINIC_OR_DEPARTMENT_OTHER): Payer: Medicare Other | Admitting: Hematology and Oncology

## 2016-08-20 VITALS — BP 125/74 | HR 89 | Temp 96.9°F | Resp 18 | Wt 153.4 lb

## 2016-08-20 DIAGNOSIS — G629 Polyneuropathy, unspecified: Secondary | ICD-10-CM | POA: Insufficient documentation

## 2016-08-20 DIAGNOSIS — Z79899 Other long term (current) drug therapy: Secondary | ICD-10-CM | POA: Insufficient documentation

## 2016-08-20 DIAGNOSIS — I10 Essential (primary) hypertension: Secondary | ICD-10-CM | POA: Diagnosis not present

## 2016-08-20 DIAGNOSIS — I700 Atherosclerosis of aorta: Secondary | ICD-10-CM | POA: Diagnosis not present

## 2016-08-20 DIAGNOSIS — D751 Secondary polycythemia: Secondary | ICD-10-CM | POA: Diagnosis not present

## 2016-08-20 DIAGNOSIS — R911 Solitary pulmonary nodule: Secondary | ICD-10-CM

## 2016-08-20 DIAGNOSIS — Z85038 Personal history of other malignant neoplasm of large intestine: Secondary | ICD-10-CM | POA: Diagnosis not present

## 2016-08-20 DIAGNOSIS — I739 Peripheral vascular disease, unspecified: Secondary | ICD-10-CM

## 2016-08-20 DIAGNOSIS — N400 Enlarged prostate without lower urinary tract symptoms: Secondary | ICD-10-CM | POA: Insufficient documentation

## 2016-08-20 DIAGNOSIS — F17210 Nicotine dependence, cigarettes, uncomplicated: Secondary | ICD-10-CM | POA: Insufficient documentation

## 2016-08-20 DIAGNOSIS — K565 Intestinal adhesions [bands], unspecified as to partial versus complete obstruction: Secondary | ICD-10-CM | POA: Diagnosis not present

## 2016-08-20 DIAGNOSIS — K8020 Calculus of gallbladder without cholecystitis without obstruction: Secondary | ICD-10-CM | POA: Insufficient documentation

## 2016-08-20 DIAGNOSIS — Z9049 Acquired absence of other specified parts of digestive tract: Secondary | ICD-10-CM | POA: Insufficient documentation

## 2016-08-20 DIAGNOSIS — C182 Malignant neoplasm of ascending colon: Secondary | ICD-10-CM

## 2016-08-20 DIAGNOSIS — R970 Elevated carcinoembryonic antigen [CEA]: Secondary | ICD-10-CM | POA: Insufficient documentation

## 2016-08-20 LAB — CBC WITH DIFFERENTIAL/PLATELET
Basophils Absolute: 0.1 10*3/uL (ref 0–0.1)
Basophils Relative: 1 %
Eosinophils Absolute: 0.1 10*3/uL (ref 0–0.7)
Eosinophils Relative: 1 %
HCT: 52.7 % — ABNORMAL HIGH (ref 40.0–52.0)
Hemoglobin: 17.4 g/dL (ref 13.0–18.0)
Lymphocytes Relative: 33 %
Lymphs Abs: 2.8 10*3/uL (ref 1.0–3.6)
MCH: 31.9 pg (ref 26.0–34.0)
MCHC: 32.9 g/dL (ref 32.0–36.0)
MCV: 96.8 fL (ref 80.0–100.0)
Monocytes Absolute: 0.5 10*3/uL (ref 0.2–1.0)
Monocytes Relative: 6 %
Neutro Abs: 5 10*3/uL (ref 1.4–6.5)
Neutrophils Relative %: 59 %
Platelets: 156 10*3/uL (ref 150–440)
RBC: 5.44 MIL/uL (ref 4.40–5.90)
RDW: 14.6 % — ABNORMAL HIGH (ref 11.5–14.5)
WBC: 8.5 10*3/uL (ref 3.8–10.6)

## 2016-08-20 LAB — COMPREHENSIVE METABOLIC PANEL
ALT: 12 U/L — ABNORMAL LOW (ref 17–63)
AST: 25 U/L (ref 15–41)
Albumin: 4.1 g/dL (ref 3.5–5.0)
Alkaline Phosphatase: 93 U/L (ref 38–126)
Anion gap: 7 (ref 5–15)
BUN: 10 mg/dL (ref 6–20)
CO2: 25 mmol/L (ref 22–32)
Calcium: 9 mg/dL (ref 8.9–10.3)
Chloride: 104 mmol/L (ref 101–111)
Creatinine, Ser: 1.23 mg/dL (ref 0.61–1.24)
GFR calc Af Amer: >60 mL/min (ref >60)
GFR calc non Af Amer: >60 mL/min (ref >60)
Glucose, Bld: 131 mg/dL — ABNORMAL HIGH (ref 65–99)
Potassium: 3.8 mmol/L (ref 3.5–5.1)
Sodium: 136 mmol/L (ref 135–145)
Total Bilirubin: 0.8 mg/dL (ref 0.3–1.2)
Total Protein: 7.7 g/dL (ref 6.5–8.1)

## 2016-08-20 MED ORDER — HEPARIN SOD (PORK) LOCK FLUSH 100 UNIT/ML IV SOLN
500.0000 [IU] | Freq: Once | INTRAVENOUS | Status: AC
  Administered 2016-08-20: 500 [IU] via INTRAVENOUS

## 2016-08-20 MED ORDER — SODIUM CHLORIDE 0.9% FLUSH
10.0000 mL | INTRAVENOUS | Status: AC | PRN
  Administered 2016-08-20: 10 mL via INTRAVENOUS
  Filled 2016-08-20: qty 10

## 2016-08-20 NOTE — Progress Notes (Signed)
Patient offers no complaints today. 

## 2016-08-20 NOTE — Progress Notes (Signed)
[place] [facility] day:  08/20/2016   Chief Complaint: William Ford is a 66 y.o. male with stage IIB colon cancer and polycythemia who is seen for 1 month assessment.  HPI: The patient was last seen in the medical oncology clinic on 07/16/2016.  At that time, he described indigestion with foods.  Recent ultrasound revealed cholelithiasis.  He also noted a sensation of leg tightening with walking.  CT scan had revealed calcific atherosclerotic disease of the aorta with persistent near complete occlusion of the proximal superficial femoral arteries bilaterally.  We discussed vascular assessment.  CT scan also noted slight enlargement of the RUL subpleural pulmonary nodule (4.6 mm).  As the nodule was too small to biopsy, continued surveillance was recommended.   During the interim, he notes "no problems".  He continues to have symptoms in his legs with ambulation.  Sometimes his feet are cold.  He has cut back on his smoking to 1 pack/week.  Some days he does not smoke.  He believes today is the day he will stop smoking.   Past Medical History:  Diagnosis Date  . Colon cancer ([place])   . Hypertension     Past Surgical History:  Procedure Laterality Date  . COLON SURGERY  2013    Family History  Problem Relation Age of Onset  . Cancer Maternal Grandfather     Social History:  reports that he has been smoking Cigarettes.  He has been smoking about 0.50 packs per day. He has never used smokeless tobacco. He reports that he does not drink alcohol or use drugs.  He started smoking at age 61.  He has stopped smoking several times.  He was smoking 1pack/week.  He has cut back over the past month.  He lives in [place].  The patient is alone today.  Allergies: No Known Allergies  Current Medications: Current Outpatient Prescriptions  Medication Sig Dispense Refill  . amLODipine-benazepril (LOTREL) 5-20 MG capsule Take 1 capsule by mouth daily. Take one  capsule every morning    . CELEBREX 200 MG capsule Take 200 mg by mouth daily.   0  . gabapentin (NEURONTIN) 300 MG capsule Take 1 capsule by mouth daily.   0  . L-Methylfolate-Algae-B12-B6 (FOLTANX RF) 3-90.314-2-35 MG CAPS   0  . LYRICA 75 MG capsule take 1 capsule by mouth twice a day 60 capsule 3  . sildenafil (REVATIO) 20 MG tablet Take 2 tablets by mouth as needed.  0   No current facility-administered medications for this visit.    Facility-Administered Medications Ordered in Other Visits  Medication Dose Route Frequency Provider Last Rate Last Dose  . sodium chloride 0.9 % injection 10 mL  10 mL Intravenous PRN Lequita Asal, MD   10 mL at 12/13/14 1043    Review of Systems:  GENERAL:  Feels "fine".  No fevers or sweats.  Weight stable. PERFORMANCE STATUS (ECOG):  0 HEENT:  No visual changes, runny nose, sore throat, mouth sores or tenderness. Lungs: No shortness of breath or cough.  No hemoptysis. Cardiac:  No chest pain, palpitations, orthopnea, or PND. GI:  Indigestion with some foods.  No RUQ pain.  No nausea, vomiting, diarrhea, constipation, melena or hematochezia. GU:  No urgency, frequency, dysuria, or hematuria. Musculoskeletal:  No back pain.  No joint pain.  No muscle tenderness. Extremities:  Leg muscles tight with walking.  No pain or swelling. Skin:  No rashes or skin changes. Neuro:  Oxaliplatin  neuropathy (stable).  No headache, weakness, balance or coordination issues. Endocrine:  No diabetes, thyroid issues, hot flashes or night sweats. Psych:  No mood changes, depression or anxiety. Pain:  No focal pain. Review of systems:  All other systems reviewed and found to be negative.  Physical Exam: Blood pressure 125/74, pulse 89, temperature (!) 96.9 F (36.1 C), temperature source Tympanic, resp. rate 18, weight 153 lb 7 oz (69.6 kg). GENERAL:  Well developed, well nourished, gentleman sitting comfortably in the exam room in no acute distress. MENTAL  STATUS:  Alert and oriented to person, place and time. HEAD:  Wearing a beige golf cap.  Alopecia.  William Ford.  Normocephalic, atraumatic, face symmetric, no Cushingoid features. EYES:  Glasses.  Brown eyes.  Pupils equal round and reactive to light and accomodation.  No conjunctivitis or scleral icterus. ENT:  Oropharynx clear without lesion.  Tongue normal. Mucous membranes moist.  RESPIRATORY:  Clear to auscultation without rales, wheezes or rhonchi. CARDIOVASCULAR:  Regular rate and rhythm without murmur, rub or gallop. ABDOMEN:  Soft, non-tender, with active bowel sounds, and no hepatosplenomegaly.  No masses. SKIN:  3.5 cm left anterior chest wall cyst.  Left facial cyst.  No rashes, ulcers or lesions. EXTREMITIES: Clubbing.  No edema, no skin discoloration or tenderness.  No palpable cords. LYMPH NODES: No palpable cervical, supraclavicular, axillary or inguinal adenopathy  NEUROLOGICAL: Unremarkable. PSYCH:  Appropriate.   Appointment on 08/20/2016  Component Date Value Ref Range Status  . WBC 08/20/2016 8.5  3.8 - 10.6 K/uL Final  . RBC 08/20/2016 5.44  4.40 - 5.90 MIL/uL Final  . Hemoglobin 08/20/2016 17.4  13.0 - 18.0 g/dL Final  . HCT 08/20/2016 52.7* 40.0 - 52.0 % Final  . MCV 08/20/2016 96.8  80.0 - 100.0 fL Final  . MCH 08/20/2016 31.9  26.0 - 34.0 pg Final  . MCHC 08/20/2016 32.9  32.0 - 36.0 g/dL Final  . RDW 08/20/2016 14.6* 11.5 - 14.5 % Final  . Platelets 08/20/2016 156  150 - 440 K/uL Final  . Neutrophils Relative % 08/20/2016 59  % Final  . Neutro Abs 08/20/2016 5.0  1.4 - 6.5 K/uL Final  . Lymphocytes Relative 08/20/2016 33  % Final  . Lymphs Abs 08/20/2016 2.8  1.0 - 3.6 K/uL Final  . Monocytes Relative 08/20/2016 6  % Final  . Monocytes Absolute 08/20/2016 0.5  0.2 - 1.0 K/uL Final  . Eosinophils Relative 08/20/2016 1  % Final  . Eosinophils Absolute 08/20/2016 0.1  0 - 0.7 K/uL Final  . Basophils Relative 08/20/2016 1  % Final  . Basophils Absolute  08/20/2016 0.1  0 - 0.1 K/uL Final  . Sodium 08/20/2016 136  135 - 145 mmol/L Final  . Potassium 08/20/2016 3.8  3.5 - 5.1 mmol/L Final  . Chloride 08/20/2016 104  101 - 111 mmol/L Final  . CO2 08/20/2016 25  22 - 32 mmol/L Final  . Glucose, Bld 08/20/2016 131* 65 - 99 mg/dL Final  . BUN 08/20/2016 10  6 - 20 mg/dL Final  . Creatinine, Ser 08/20/2016 1.23  0.61 - 1.24 mg/dL Final  . Calcium 08/20/2016 9.0  8.9 - 10.3 mg/dL Final  . Total Protein 08/20/2016 7.7  6.5 - 8.1 g/dL Final  . Albumin 08/20/2016 4.1  3.5 - 5.0 g/dL Final  . AST 08/20/2016 25  15 - 41 U/L Final  . ALT 08/20/2016 12* 17 - 63 U/L Final  . Alkaline Phosphatase 08/20/2016 93  38 -  126 U/L Final  . Total Bilirubin 08/20/2016 0.8  0.3 - 1.2 mg/dL Final  . GFR calc non Af Amer 08/20/2016 >60  >60 mL/min Final  . GFR calc Af Amer 08/20/2016 >60  >60 mL/min Final   Comment: (NOTE) The eGFR has been calculated using the CKD EPI equation. This calculation has not been validated in all clinical situations. eGFR's persistently <60 mL/min signify possible Chronic Kidney Disease.   William Ford gap 08/20/2016 7  5 - 15 Final    Assessment:  [facility]ension Stfleur is a 66 y.o. male with stage IIB colon cancer.  He underwent right colectomy on 11/05/2012.  Pathology revealed a T3N0M0 tumor in the cecum with 17 negative lymph nodes.  Circumferential margin was positive.  There was lymphovascular invasion. He had 1 deposit of metastases in peritoneum.  Mismatch repair was positive.  He received 12 cycles of FOLFOX chemotherapy from 12/31/2012 - 08/19/2013.  Chemotherapy was interrupted because of small bowel obstruction due to adhesions in 05/2013.  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  He has had an elevated CEA with negative imaging studies.  CEA was 10.7 on 12/31/2012, 13.4 on 06/17/2013, 11.7 on 07/18/2013, 8.3 on 09/28/2013, 8.0 on 12/30/2013, 9.6 on 05/17/2014, 9.8 on 10/25/2014, 10.3 on 05/09/2015, 10.4 on  07/25/2015, 8.4 on 08/08/2015, 8.8 on 11/07/2015, 8.3 on 02/06/2016, and 18.8 on 05/28/2016.    Last colonoscopy was in 2017 (unknown provider).   Chest, abdomen, and pelvic CT scan on 07/03/2014 revealed no evidence of metastatic disease.  There was a new irregular shaped pleural based pulmonary nodule in the periphery of the RUL most likely infectious/inflammatory. 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    Chest CT on 10/20/2014 revealed a stable small pleural-based area of nodularity in the right upper lobe (favor benign area of scarring). There was mild diffuse bronchial wall thickening with moderate centrilobular and mild paraseptal emphysema; imaging findings suggestive of underlying COPD.  Chest CT on 07/19/2015 revealed no acute cardiopulmonary abnormalities.  He has a peripheral scar like density within the right upper lobe.   Chest, abdomen, and pelvic CT on 07/03/2016 revealed slight enlargement of the right upper lobe subpleural pulmonary nodule (3 mm to 4.6 mm).  There was persistent irregularity of the gallbladder wall. There was a stable 12 mm right adrenal nodule.  There was mild enlargement of the prostate gland. 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  PSA was 0.71 on 07/23/2016.  Abdominal ultrasound on 07/14/2016 revealed cholelithiasis without evidence of cholecystitis.  He has secondary polycythemia secondary to smoking. Work-up on 07/25/2015 revealed a hematocrit of 57.2, hemoglobin 19.2, MCV 95.1, platelets 169,000, WBC 8300 with an ANC of 4900.  Erythropoietin level was 6.0 (2.6-18.5).  Testosterone level was 680.  JAK2 was negative for V617F and exon 12.  Ferritin was 90, iron saturation 24% , and TIBC 372.    He undergoes small volume phlebotomies (250 cc) to maintain a hematocrit < 52 and a hemoglobin < 18.  With an elevated hematocrit, he feels  sluggish/drowsy.  He feels better 2 days after his phlebotomies.  He underwent phlebotomy (500 cc) on 07/25/2015 with subsequent fatigue.  Last phlebotomy was on 06/25/2016.  Symptomatically, he has indigestion with some foods.  Imaging reveals cholelithiasis.  His legs continue to feel tight with walking.  Imaging reveals near complete occlusion of the proximal superficial femoral arteries.  He  is smoking 1 pack/week.  Hemoglobin is 17.4.  Plan: 1.  Labs today: CBC with diff, CMP, CEA. 2.  Small volume phlebotomy today to maintain hematocrit < 52. 3.  Encourage smoking cessation. 4.  Phone follow-up with radiology IV:[id number].   5.  Consult vascular surgery. 6.  Port flush every 6-8 weeks. 7.  If CEA remains elevated (similar to last result), will perform PET scan and present at tumor board. 8.  RTC monthly for hematocrit +/- phlebotomy. 9.  RTC in 4 months for MD assessment, labs (CBC with diff, CMP, CEA, ferritin) +/- phlebotomy.   Lequita Asal, MD  08/20/2016, 9:59 AM

## 2016-08-21 LAB — CEA: CEA: 8.8 ng/mL — ABNORMAL HIGH (ref 0.0–4.7)

## 2016-09-03 ENCOUNTER — Inpatient Hospital Stay: Payer: Medicare Other

## 2016-09-17 ENCOUNTER — Inpatient Hospital Stay: Payer: Medicare Other | Attending: Hematology and Oncology

## 2016-09-17 ENCOUNTER — Inpatient Hospital Stay: Payer: Medicare Other

## 2016-09-17 DIAGNOSIS — C180 Malignant neoplasm of cecum: Secondary | ICD-10-CM | POA: Diagnosis present

## 2016-09-17 DIAGNOSIS — D751 Secondary polycythemia: Secondary | ICD-10-CM

## 2016-09-17 LAB — HEMATOCRIT: HCT: 51.5 % (ref 40.0–52.0)

## 2016-10-15 ENCOUNTER — Inpatient Hospital Stay: Payer: Medicare Other

## 2016-10-22 ENCOUNTER — Inpatient Hospital Stay: Payer: Medicare Other | Attending: Hematology and Oncology

## 2016-10-22 ENCOUNTER — Inpatient Hospital Stay: Payer: Medicare Other

## 2016-10-22 DIAGNOSIS — D751 Secondary polycythemia: Secondary | ICD-10-CM

## 2016-10-22 DIAGNOSIS — C180 Malignant neoplasm of cecum: Secondary | ICD-10-CM | POA: Insufficient documentation

## 2016-10-22 LAB — HEMATOCRIT: HCT: 50.3 % (ref 40.0–52.0)

## 2016-10-22 MED ORDER — HEPARIN SOD (PORK) LOCK FLUSH 100 UNIT/ML IV SOLN
500.0000 [IU] | Freq: Once | INTRAVENOUS | Status: AC
  Administered 2016-10-22: 500 [IU] via INTRAVENOUS

## 2016-10-22 MED ORDER — SODIUM CHLORIDE 0.9% FLUSH
10.0000 mL | INTRAVENOUS | Status: DC | PRN
  Administered 2016-10-22: 10 mL via INTRAVENOUS
  Filled 2016-10-22: qty 10

## 2016-11-04 ENCOUNTER — Ambulatory Visit (INDEPENDENT_AMBULATORY_CARE_PROVIDER_SITE_OTHER): Payer: Medicare Other | Admitting: Vascular Surgery

## 2016-11-04 ENCOUNTER — Encounter (INDEPENDENT_AMBULATORY_CARE_PROVIDER_SITE_OTHER): Payer: Self-pay | Admitting: Vascular Surgery

## 2016-11-04 VITALS — BP 131/79 | HR 94 | Resp 17 | Ht 73.0 in | Wt 142.8 lb

## 2016-11-04 DIAGNOSIS — F17200 Nicotine dependence, unspecified, uncomplicated: Secondary | ICD-10-CM | POA: Insufficient documentation

## 2016-11-04 DIAGNOSIS — I70213 Atherosclerosis of native arteries of extremities with intermittent claudication, bilateral legs: Secondary | ICD-10-CM

## 2016-11-04 DIAGNOSIS — F17210 Nicotine dependence, cigarettes, uncomplicated: Secondary | ICD-10-CM

## 2016-11-04 DIAGNOSIS — I10 Essential (primary) hypertension: Secondary | ICD-10-CM | POA: Diagnosis not present

## 2016-11-04 DIAGNOSIS — D751 Secondary polycythemia: Secondary | ICD-10-CM | POA: Diagnosis not present

## 2016-11-04 DIAGNOSIS — I70219 Atherosclerosis of native arteries of extremities with intermittent claudication, unspecified extremity: Secondary | ICD-10-CM | POA: Insufficient documentation

## 2016-11-04 NOTE — Progress Notes (Signed)
Patient ID: William Ford, male   DOB: Dec 21, 1950, 66 y.o.   MRN: [id number]  Chief Complaint  Patient presents with  . New Patient (Initial Visit)    occlusion of proximal femoral arteries    HPI William Ford is a 66 y.o. male.  I am asked to see the patient by Dr. Mike Gip and Dr. Elijio Miles for evaluation of PAD.  The patient reports Pain and cramping in his legs with walking. When asked how far he can walk, he points out a distance of about 40-50 yards to me today. Both lower extremities began developing cramping in the thighs radiating down into the calves and lower legs. This started several months ago and seems to be gradually worsening. There was no clear inciting event or causative factor that started the symptoms. The patient no antecedent history of DVT, superficial thrombophlebitis, or previous interventions to the lower extremity. The patient does not have lower extremity ulcerations. He denies ischemic rest pain. He had a CT scan with contrast from April of this year which I have independently reviewed. The official report on this as usual as not entirely correct. There describes near complete occlusions of both proximal femoral arteries. The patient clearly has total occlusions of the SFA which are a flush occlusion on the right and a near flush or proximal occlusion on the left. The scan does not go far enough down to see where this reconstitutes.   Past Medical History:  Diagnosis Date  . Colon cancer ([place])   . Hypertension     Past Surgical History:  Procedure Laterality Date  . COLON SURGERY  2013    Family History  Problem Relation Age of Onset  . Cancer Maternal Grandfather   No bleeding disorders, clotting disorders, aneurysms, or autoimmune diseases  Social History Social History  Substance Use Topics  . Smoking status: Current Every Day Smoker    Packs/day: 0.50    Types: Cigarettes  . Smokeless tobacco: Never Used  . Alcohol use No  No IVDU  No Known  Allergies  Current Outpatient Prescriptions  Medication Sig Dispense Refill  . amLODipine-benazepril (LOTREL) 5-20 MG capsule Take 1 capsule by mouth daily. Take one capsule every morning    . CELEBREX 200 MG capsule Take 200 mg by mouth daily.   0  . gabapentin (NEURONTIN) 300 MG capsule Take 1 capsule by mouth daily.   0  . L-Methylfolate-Algae-B12-B6 (FOLTANX RF) 3-90.314-2-35 MG CAPS   0  . LYRICA 75 MG capsule take 1 capsule by mouth twice a day 60 capsule 3  . sildenafil (REVATIO) 20 MG tablet Take 2 tablets by mouth as needed.  0   No current facility-administered medications for this visit.    Facility-Administered Medications Ordered in Other Visits  Medication Dose Route Frequency Provider Last Rate Last Dose  . sodium chloride 0.9 % injection 10 mL  10 mL Intravenous PRN Nolon Stalls C, MD   10 mL at 12/13/14 1043  . sodium chloride flush (NS) 0.9 % injection 10 mL  10 mL Intravenous PRN Lequita Asal, MD   10 mL at 08/20/16 1050      REVIEW OF SYSTEMS (Negative unless checked)  Constitutional: [] Weight loss  [] Fever  [] Chills Cardiac: [] Chest pain   [] Chest pressure   [] Palpitations   [] Shortness of breath when laying flat   [] Shortness of breath at rest   [] Shortness of breath with exertion. Vascular:  [x] Pain in legs with walking   [] Pain in legs  at rest   [] Pain in legs when laying flat   [x] Claudication   [] Pain in feet when walking  [] Pain in feet at rest  [] Pain in feet when laying flat   [] History of DVT   [] Phlebitis   [] Swelling in legs   [] Varicose veins   [] Non-healing ulcers Pulmonary:   [] Uses home oxygen   [] Productive cough   [] Hemoptysis   [] Wheeze  [] COPD   [] Asthma Neurologic:  [] Dizziness  [] Blackouts   [] Seizures   [] History of stroke   [] History of TIA  [] Aphasia   [] Temporary blindness   [] Dysphagia   [] Weakness or numbness in arms   [] Weakness or numbness in legs Musculoskeletal:  [] Arthritis   [] Joint swelling   [] Joint pain   [] Low back  pain Hematologic:  [] Easy bruising  [] Easy bleeding   [] Hypercoagulable state   [] Anemic  [] Hepatitis Gastrointestinal:  [] Blood in stool   [] Vomiting blood  [] Gastroesophageal reflux/heartburn   [] Abdominal pain Genitourinary:  [] Chronic kidney disease   [] Difficult urination  [] Frequent urination  [] Burning with urination   [] Hematuria Skin:  [] Rashes   [] Ulcers   [] Wounds Psychological:  [] History of anxiety   []  History of major depression.    Physical Exam BP 131/79   Pulse 94   Resp 17   Ht 6' 1"  (1.854 m)   Wt 142 lb 12.8 oz (64.8 kg)   BMI 18.84 kg/m  Gen:  WD/WN, NAD Head: [place]/AT, No temporalis wasting.  Ear/Nose/Throat: Hearing grossly intact, nares w/o erythema or drainage, oropharynx w/o Erythema/Exudate Eyes: Conjunctiva clear, sclera non-icteric  Neck: trachea midline.  No JVD.  Pulmonary:  Good air movement, respirations not labored, no use of accessory muscles Cardiac: RRR, normal S1, S2 Vascular:  Vessel Right Left  Radial Palpable Palpable                      Popliteal Not Palpable Not Palpable  PT 1+ Palpable Not Palpable  DP Trace Palpable 1+ Palpable   Gastrointestinal: soft, non-tender/non-distended. Laparotomy scar well-healed Musculoskeletal: M/S 5/5 throughout. No deformity or atrophy. No significant lower extremity edema. Neurologic: Sensation grossly intact in extremities.  Symmetrical.  Speech is fluent. Motor exam as listed above. Psychiatric: Judgment intact, Mood & affect appropriate for pt's clinical situation. Dermatologic: No rashes or ulcers noted.  No cellulitis or open wounds. Lymph : No Cervical, Axillary, or Inguinal lymphadenopathy.   Radiology No results found.  Labs Recent Results (from the past 2160 hour(s))  CBC with Differential/Platelet     Status: Abnormal   Collection Time: 08/20/16  9:04 AM  Result Value Ref Range   WBC 8.5 3.8 - 10.6 K/uL   RBC 5.44 4.40 - 5.90 MIL/uL   Hemoglobin 17.4 13.0 - 18.0 g/dL   HCT 52.7  (H) 40.0 - 52.0 %   MCV 96.8 80.0 - 100.0 fL   MCH 31.9 26.0 - 34.0 pg   MCHC 32.9 32.0 - 36.0 g/dL   RDW 14.6 (H) 11.5 - 14.5 %   Platelets 156 150 - 440 K/uL   Neutrophils Relative % 59 %   Neutro Abs 5.0 1.4 - 6.5 K/uL   Lymphocytes Relative 33 %   Lymphs Abs 2.8 1.0 - 3.6 K/uL   Monocytes Relative 6 %   Monocytes Absolute 0.5 0.2 - 1.0 K/uL   Eosinophils Relative 1 %   Eosinophils Absolute 0.1 0 - 0.7 K/uL   Basophils Relative 1 %   Basophils Absolute 0.1 0 - 0.1 K/uL  Comprehensive metabolic panel     Status: Abnormal   Collection Time: 08/20/16  9:04 AM  Result Value Ref Range   Sodium 136 135 - 145 mmol/L   Potassium 3.8 3.5 - 5.1 mmol/L   Chloride 104 101 - 111 mmol/L   CO2 25 22 - 32 mmol/L   Glucose, Bld 131 (H) 65 - 99 mg/dL   BUN 10 6 - 20 mg/dL   Creatinine, Ser 1.23 0.61 - 1.24 mg/dL   Calcium 9.0 8.9 - 10.3 mg/dL   Total Protein 7.7 6.5 - 8.1 g/dL   Albumin 4.1 3.5 - 5.0 g/dL   AST 25 15 - 41 U/L   ALT 12 (L) 17 - 63 U/L   Alkaline Phosphatase 93 38 - 126 U/L   Total Bilirubin 0.8 0.3 - 1.2 mg/dL   GFR calc non Af Amer >60 >60 mL/min   GFR calc Af Amer >60 >60 mL/min    Comment: (NOTE) The eGFR has been calculated using the CKD EPI equation. This calculation has not been validated in all clinical situations. eGFR's persistently <60 mL/min signify possible Chronic Kidney Disease.    Anion gap 7 5 - 15  CEA     Status: Abnormal   Collection Time: 08/20/16  9:04 AM  Result Value Ref Range   CEA 8.8 (H) 0.0 - 4.7 ng/mL    Comment: (NOTE)       Roche ECLIA methodology       Nonsmokers  <3.9                                     Smokers     <5.6 Performed At: [facility] [place], [place] [id number] Lindon Romp MD [id number]   Hematocrit     Status: None   Collection Time: 09/17/16 10:23 AM  Result Value Ref Range   HCT 51.5 40.0 - 52.0 %  Hematocrit     Status: None   Collection Time: 10/22/16 10:44 AM  Result Value Ref  Range   HCT 50.3 40.0 - 52.0 %    Assessment/Plan:  Polycythemia, secondary Increases his clotting risk  Hypertension blood pressure control important in reducing the progression of atherosclerotic disease. On appropriate oral medications.   Tobacco use disorder We had a discussion for approximately 3-4 minutes regarding the absolute need for smoking cessation due to the deleterious nature of tobacco on the vascular system. We discussed the tobacco use would diminish patency of any intervention, and likely significantly worsen progressio of disease. We discussed multiple agents for quitting including replacement therapy or medications to reduce cravings such as Chantix. The patient voices their understanding of the importance of smoking cessation.   Atherosclerosis of native arteries of extremity with intermittent claudication [facility]) He had a CT scan with contrast from April of this year which I have independently reviewed. The official report on this as usual as not entirely correct. There describes near complete occlusions of both proximal femoral arteries. The patient clearly has total occlusions of the SFA which are a flush occlusion on the right and a near flush or proximal occlusion on the left. The scan does not go far enough down to see where this reconstitutes. We had a long discussion today about the pathophysiology and natural history of peripheral arterial disease. The patient does not have immediate limb threatening symptoms, but certainly does have claudication-type symptoms. These are  moderately lifestyle limiting to him currently. When offered intervention versus a reasonably short follow-up of several months and attempt at a dedicated exercise regimen and tobacco cessation, he would prefer the latter. He understands he is not an immediate limb threat. We will plan to see him back in 3 months with noninvasive studies for further evaluation of his perfusion. He will contact our  office with any problems in the interim.      Leotis Pain 11/04/2016, 1:28 PM   This note was created with Dragon medical transcription system.  Any errors from dictation are unintentional.

## 2016-11-04 NOTE — Assessment & Plan Note (Signed)
blood pressure control important in reducing the progression of atherosclerotic disease. On appropriate oral medications.  

## 2016-11-04 NOTE — Patient Instructions (Signed)
Peripheral Vascular Disease Peripheral vascular disease (PVD) is a disease of the blood vessels that are not part of your heart and brain. A simple term for PVD is poor circulation. In most cases, PVD narrows the blood vessels that carry blood from your heart to the rest of your body. This can result in a decreased supply of blood to your arms, legs, and internal organs, like your stomach or kidneys. However, it most often affects a person's lower legs and feet. There are two types of PVD.  Organic PVD. This is the more common type. It is caused by damage to the structure of blood vessels.  Functional PVD. This is caused by conditions that make blood vessels contract and tighten (spasm).  Without treatment, PVD tends to get worse over time. PVD can also lead to acute ischemic limb. This is when an arm or limb suddenly has trouble getting enough blood. This is a medical emergency. What are the causes? Each type of PVD has many different causes. The most common cause of PVD is buildup of a fatty material (plaque) inside of your arteries (atherosclerosis). Small amounts of plaque can break off from the walls of the blood vessels and become lodged in a smaller artery. This blocks blood flow and can cause acute ischemic limb. Other common causes of PVD include:  Blood clots that form inside of blood vessels.  Injuries to blood vessels.  Diseases that cause inflammation of blood vessels or cause blood vessel spasms.  Health behaviors and health history that increase your risk of developing PVD.  What increases the risk? You may have a greater risk of PVD if you:  Have a family history of PVD.  Have certain medical conditions, including: ? High cholesterol. ? Diabetes. ? High blood pressure (hypertension). ? Coronary heart disease. ? Past problems with blood clots. ? Past injury, such as burns or a broken bone. These may have damaged blood vessels in your limbs. ? Buerger disease. This is  caused by inflamed blood vessels in your hands and feet. ? Some forms of arthritis. ? Rare birth defects that affect the arteries in your legs.  Use tobacco.  Do not get enough exercise.  Are obese.  Are age 50 or older.  What are the signs or symptoms? PVD may cause many different symptoms. Your symptoms depend on what part of your body is not getting enough blood. Some common signs and symptoms include:  Cramps in your lower legs. This may be a symptom of poor leg circulation (claudication).  Pain and weakness in your legs while you are physically active that goes away when you rest (intermittent claudication).  Leg pain when at rest.  Leg numbness, tingling, or weakness.  Coldness in a leg or foot, especially when compared with the other leg.  Skin or hair changes. These can include: ? Hair loss. ? Shiny skin. ? Pale or bluish skin. ? Thick toenails.  Inability to get or maintain an erection (erectile dysfunction).  People with PVD are more prone to developing ulcers and sores on their toes, feet, or legs. These may take longer than normal to heal. How is this diagnosed? Your health care provider may diagnose PVD from your signs and symptoms. The health care provider will also do a physical exam. You may have tests to find out what is causing your PVD and determine its severity. Tests may include:  Blood pressure recordings from your arms and legs and measurements of the strength of your pulses (  pulse volume recordings).  Imaging studies using sound waves to take pictures of the blood flow through your blood vessels (Doppler ultrasound).  Injecting a dye into your blood vessels before having imaging studies using: ? X-rays (angiogram or arteriogram). ? Computer-generated X-rays (CT angiogram). ? A powerful electromagnetic field and a computer (magnetic resonance angiogram or MRA).  How is this treated? Treatment for PVD depends on the cause of your condition and the  severity of your symptoms. It also depends on your age. Underlying causes need to be treated and controlled. These include long-lasting (chronic) conditions, such as diabetes, high cholesterol, and high blood pressure. You may need to first try making lifestyle changes and taking medicines. Surgery may be needed if these do not work. Lifestyle changes may include:  Quitting smoking.  Exercising regularly.  Following a low-fat, low-cholesterol diet.  Medicines may include:  Blood thinners to prevent blood clots.  Medicines to improve blood flow.  Medicines to improve your blood cholesterol levels.  Surgical procedures may include:  A procedure that uses an inflated balloon to open a blocked artery and improve blood flow (angioplasty).  A procedure to put in a tube (stent) to keep a blocked artery open (stent implant).  Surgery to reroute blood flow around a blocked artery (peripheral bypass surgery).  Surgery to remove dead tissue from an infected wound on the affected limb.  Amputation. This is surgical removal of the affected limb. This may be necessary in cases of acute ischemic limb that are not improved through medical or surgical treatments.  Follow these instructions at home:  Take medicines only as directed by your health care provider.  Do not use any tobacco products, including cigarettes, chewing tobacco, or electronic cigarettes. If you need help quitting, ask your health care provider.  Lose weight if you are overweight, and maintain a healthy weight as directed by your health care provider.  Eat a diet that is low in fat and cholesterol. If you need help, ask your health care provider.  Exercise regularly. Ask your health care provider to suggest some good activities for you.  Use compression stockings or other mechanical devices as directed by your health care provider.  Take good care of your feet. ? Wear comfortable shoes that fit well. ? Check your feet  often for any cuts or sores. Contact a health care provider if:  You have cramps in your legs while walking.  You have leg pain when you are at rest.  You have coldness in a leg or foot.  Your skin changes.  You have erectile dysfunction.  You have cuts or sores on your feet that are not healing. Get help right away if:  Your arm or leg turns cold and blue.  Your arms or legs become red, warm, swollen, painful, or numb.  You have chest pain or trouble breathing.  You suddenly have weakness in your face, arm, or leg.  You become very confused or lose the ability to speak.  You suddenly have a very bad headache or lose your vision. This information is not intended to replace advice given to you by your health care provider. Make sure you discuss any questions you have with your health care provider. Document Released: 04/24/2004 Document Revised: 08/23/2015 Document Reviewed: 08/25/2013 Elsevier Interactive Patient Education  2017 [employer]  

## 2016-11-04 NOTE — Assessment & Plan Note (Signed)
He had a CT scan with contrast from April of this year which I have independently reviewed. The official report on this as usual as not entirely correct. There describes near complete occlusions of both proximal femoral arteries. The patient clearly has total occlusions of the SFA which are a flush occlusion on the right and a near flush or proximal occlusion on the left. The scan does not go far enough down to see where this reconstitutes. We had a long discussion today about the pathophysiology and natural history of peripheral arterial disease. The patient does not have immediate limb threatening symptoms, but certainly does have claudication-type symptoms. These are moderately lifestyle limiting to him currently. When offered intervention versus a reasonably short follow-up of several months and attempt at a dedicated exercise regimen and tobacco cessation, he would prefer the latter. He understands he is not an immediate limb threat. We will plan to see him back in 3 months with noninvasive studies for further evaluation of his perfusion. He will contact our office with any problems in the interim.

## 2016-11-04 NOTE — Assessment & Plan Note (Signed)
Increases his clotting risk

## 2016-11-04 NOTE — Assessment & Plan Note (Signed)
We had a discussion for approximately 3-4 minutes regarding the absolute need for smoking cessation due to the deleterious nature of tobacco on the vascular system. We discussed the tobacco use would diminish patency of any intervention, and likely significantly worsen progressio of disease. We discussed multiple agents for quitting including replacement therapy or medications to reduce cravings such as Chantix. The patient voices their understanding of the importance of smoking cessation.  

## 2016-11-19 ENCOUNTER — Inpatient Hospital Stay: Payer: Medicare Other | Attending: Hematology and Oncology

## 2016-11-19 ENCOUNTER — Inpatient Hospital Stay: Payer: Medicare Other

## 2016-11-19 DIAGNOSIS — C180 Malignant neoplasm of cecum: Secondary | ICD-10-CM | POA: Insufficient documentation

## 2016-11-19 DIAGNOSIS — D751 Secondary polycythemia: Secondary | ICD-10-CM

## 2016-11-19 LAB — HEMATOCRIT: HCT: 51.6 % (ref 40.0–52.0)

## 2016-11-26 ENCOUNTER — Inpatient Hospital Stay: Payer: Medicare Other

## 2016-12-23 NOTE — Progress Notes (Deleted)
[facility] day:  12/23/2016   Chief Complaint: William Ford is a 66 y.o. male with stage IIB colon cancer and polycythemia who is seen for 4 month assessment.  HPI: The patient was last seen in the medical oncology clinic on 08/20/2016.  At that time, he denied any new symptoms. Patient complained of continued tightness in his legs , and the sensation of his feet being cold at times. He continued to smoke, however he was actively trying to quit. He had cut back to one pack of cigarettes per week. Hematocrit was 52.7.  He underwent a small volume phlebotomy. Chemistries were unremarkable. CEA was 8.8. The patient was noted to have occlusions in his superficial femoral arteries bilaterally.  He was referred to vascular for further evaluation.  Hematocrit has been monitored monthly. Patient did not require therapeutic phlebotomy in June, July, or August.  He was seen in consult on 11/04/2016 by Dr. Lucky Cowboy to evaluate patient's peripheral artery symptoms. Dr. Lucky Cowboy reviewed CT scan done in 06/2016.  Dr. Lucky Cowboy felt that the scan demonstrated total occlusions of the superficial femoral arteries bilaterally. The patient's symptoms were not immediately limb threatening.  However, there was significant claudication symptoms that stand to affect his lifestyle. He was offered intervention versus lifestyle modification and follow-up in 3 months. Patient elected to pursue conservative approach follow-up with vascular in 3 months.  During the interim,   Past Medical History:  Diagnosis Date  . Colon cancer ([place])   . Hypertension     Past Surgical History:  Procedure Laterality Date  . COLON SURGERY  2013    Family History  Problem Relation Age of Onset  . Cancer Maternal Grandfather     Social History:  reports that he has been smoking Cigarettes.  He has been smoking about 0.50 packs per day. He has never used smokeless tobacco. He reports that he does not  drink alcohol or use drugs.  He started smoking at age 40.  He has stopped smoking several times.  He was smoking 1pack/week.  He has cut back over the past month.  He lives in [place].  The patient is alone today.  Allergies: No Known Allergies  Current Medications: Current Outpatient Prescriptions  Medication Sig Dispense Refill  . amLODipine-benazepril (LOTREL) 5-20 MG capsule Take 1 capsule by mouth daily. Take one capsule every morning    . CELEBREX 200 MG capsule Take 200 mg by mouth daily.   0  . gabapentin (NEURONTIN) 300 MG capsule Take 1 capsule by mouth daily.   0  . L-Methylfolate-Algae-B12-B6 (FOLTANX RF) 3-90.314-2-35 MG CAPS   0  . LYRICA 75 MG capsule take 1 capsule by mouth twice a day 60 capsule 3  . sildenafil (REVATIO) 20 MG tablet Take 2 tablets by mouth as needed.  0   No current facility-administered medications for this visit.    Facility-Administered Medications Ordered in Other Visits  Medication Dose Route Frequency Provider Last Rate Last Dose  . sodium chloride 0.9 % injection 10 mL  10 mL Intravenous PRN Nolon Stalls C, MD   10 mL at 12/13/14 1043  . sodium chloride flush (NS) 0.9 % injection 10 mL  10 mL Intravenous PRN Lequita Asal, MD   10 mL at 08/20/16 1050    Review of Systems:  GENERAL:  Feels "fine".  No fevers or sweats.  Weight stable. PERFORMANCE STATUS (ECOG):  0 HEENT:  No visual changes, runny nose, sore  throat, mouth sores or tenderness. Lungs: No shortness of breath or cough.  No hemoptysis. Cardiac:  No chest pain, palpitations, orthopnea, or PND. GI:  Indigestion with some foods.  No RUQ pain.  No nausea, vomiting, diarrhea, constipation, melena or hematochezia. GU:  No urgency, frequency, dysuria, or hematuria. Musculoskeletal:  No back pain.  No joint pain.  No muscle tenderness. Extremities:  Leg muscles tight with walking.  No pain or swelling. Skin:  No rashes or skin changes. Neuro:  Oxaliplatin neuropathy (stable).   No headache, weakness, balance or coordination issues. Endocrine:  No diabetes, thyroid issues, hot flashes or night sweats. Psych:  No mood changes, depression or anxiety. Pain:  No focal pain. Review of systems:  All other systems reviewed and found to be negative.  Physical Exam: There were no vitals taken for this visit. GENERAL:  Well developed, well nourished, gentleman sitting comfortably in the exam room in no acute distress. MENTAL STATUS:  Alert and oriented to person, place and time. HEAD:  Wearing a beige golf cap.  Alopecia.  [name].  Normocephalic, atraumatic, face symmetric, no Cushingoid features. EYES:  Glasses.  Brown eyes.  Pupils equal round and reactive to light and accomodation.  No conjunctivitis or scleral icterus. ENT:  Oropharynx clear without lesion.  Tongue normal. Mucous membranes moist.  RESPIRATORY:  Clear to auscultation without rales, wheezes or rhonchi. CARDIOVASCULAR:  Regular rate and rhythm without murmur, rub or gallop. ABDOMEN:  Soft, non-tender, with active bowel sounds, and no hepatosplenomegaly.  No masses. SKIN:  3.5 cm left anterior chest wall cyst.  Left facial cyst.  No rashes, ulcers or lesions. EXTREMITIES: Clubbing.  No edema, no skin discoloration or tenderness.  No palpable cords. LYMPH NODES: No palpable cervical, supraclavicular, axillary or inguinal adenopathy  NEUROLOGICAL: Unremarkable. PSYCH:  Appropriate.   No visits with results within 3 Day(s) from this visit.  Latest known visit with results is:  Appointment on 11/19/2016  Component Date Value Ref Range Status  . HCT 11/19/2016 51.6  40.0 - 52.0 % Final    Assessment:  William Ford is a 66 y.o. male with stage IIB colon cancer.  He underwent right colectomy on 11/05/2012.  Pathology revealed a T3N0M0 tumor in the cecum with 17 negative lymph nodes.  Circumferential margin was positive.  There was lymphovascular invasion. He had 1 deposit of metastases in peritoneum.   Mismatch repair was positive.  He received 12 cycles of FOLFOX chemotherapy from 12/31/2012 - 08/19/2013.  Chemotherapy was interrupted because of small bowel obstruction due to adhesions in 05/2013.  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  He has had an elevated CEA with negative imaging studies.  CEA was 10.7 on 12/31/2012, 13.4 on 06/17/2013, 11.7 on 07/18/2013, 8.3 on 09/28/2013, 8.0 on 12/30/2013, 9.6 on 05/17/2014, 9.8 on 10/25/2014, 10.3 on 05/09/2015, 10.4 on 07/25/2015, 8.4 on 08/08/2015, 8.8 on 11/07/2015, 8.3 on 02/06/2016, 18.8 on 05/28/2016, and 8.8 on 08/20/2016.    Last colonoscopy was in 2017 (unknown provider).   Chest, abdomen, and pelvic CT scan on 07/03/2014 revealed no evidence of metastatic disease.  There was a new irregular shaped pleural based pulmonary nodule in the periphery of the RUL most likely infectious/inflammatory. 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    Chest CT on 10/20/2014 revealed a stable small pleural-based area of nodularity in the right upper lobe (favor benign area of scarring). There was mild diffuse bronchial wall thickening with moderate centrilobular and mild  paraseptal emphysema; imaging findings suggestive of underlying COPD.  Chest CT on 07/19/2015 revealed no acute cardiopulmonary abnormalities.  He has a peripheral scar like density within the right upper lobe.   Chest, abdomen, and pelvic CT on 07/03/2016 revealed slight enlargement of the right upper lobe subpleural pulmonary nodule (3 mm to 4.6 mm).  There was persistent irregularity of the gallbladder wall. There was a stable 12 mm right adrenal nodule.  There was mild enlargement of the prostate gland. 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  He is followed by vascular surgery.  PSA was 0.71 on 07/23/2016.  Abdominal ultrasound on 07/14/2016 revealed  cholelithiasis without evidence of cholecystitis.  He has secondary polycythemia secondary to smoking. Work-up on 07/25/2015 revealed a hematocrit of 57.2, hemoglobin 19.2, MCV 95.1, platelets 169,000, WBC 8300 with an ANC of 4900.  Erythropoietin level was 6.0 (2.6-18.5).  Testosterone level was 680.  JAK2 was negative for V617F and exon 12.  Ferritin was 90, iron saturation 24% , and TIBC 372.    He undergoes small volume phlebotomies (250 cc) to maintain a hematocrit < 52 and a hemoglobin < 18.  With an elevated hematocrit, he feels sluggish/drowsy.  He feels better 2 days after his phlebotomies.  He underwent phlebotomy (500 cc) on 07/25/2015 with subsequent fatigue.  Last phlebotomy was on 06/25/2016.  Symptomatically,   Plan: 1.  Labs today: CBC with diff, CMP, CEA, ferritin. 2.  Continue port flushes every 6-8 weeks. 3.  Hematocrit is XXX. Goal is < 52. We will proceed with small volume phlebotomy today. 4.  Anticipate follow-up chest CT on 07/03/2017. 5.  RTC monthly for labs (hematocrit) and +/- therapeutic phlebotomy 6.  RTC in 4 months for MD assessment, labs (CBC with diff, CMP, CEA, ferritin) +/- phlebotomy.   3.  Encourage smoking cessation. 9.  RTC in 4 months for MD assessment, labs (CBC with diff, CMP, CEA, ferritin) +/- phlebotomy.   Honor Loh, NP  12/23/2016, 3:04 PM    I saw and evaluated the patient, participating in the key portions of the service and reviewing pertinent diagnostic studies and records.  I reviewed the nurse practitioner's note and agree with the findings and the plan.  The assessment and plan were discussed with the patient.  A few questions were asked by the patient and answered.   Lequita Asal, MD 12/24/2016,3:38 AM

## 2016-12-24 ENCOUNTER — Inpatient Hospital Stay: Payer: Medicare Other

## 2016-12-24 ENCOUNTER — Inpatient Hospital Stay: Payer: Medicare Other | Attending: Hematology and Oncology

## 2016-12-24 ENCOUNTER — Inpatient Hospital Stay: Payer: Medicare Other | Admitting: Hematology and Oncology

## 2016-12-30 NOTE — Progress Notes (Deleted)
[facility] day:  12/30/2016   Chief Complaint: William Ford is a 66 y.o. male with stage IIB colon cancer and polycythemia who is seen for 4 month assessment.  HPI: The patient was last seen in the medical oncology clinic on 08/20/2016.  At that time, he denied any new symptoms. Patient complained of continued tightness in his legs and the sensation of his feet being cold at times. He continued to smoke, however he was actively trying to quit. He had cut back to one pack of cigarettes per week. Hematocrit was 52.7.  He underwent a small volume phlebotomy. Chemistries were unremarkable. CEA was 8.8. The patient was noted to have occlusions in his superficial femoral arteries bilaterally.  He was referred to vascular for further evaluation.  Hematocrit has been monitored monthly. Patient did not require therapeutic phlebotomy in June, July, or August.  He was seen in consult on 11/04/2016 by Dr. Lucky Ford to evaluate patient's peripheral artery symptoms. Dr. Lucky Ford reviewed CT scan done in 06/2016.  Dr. Lucky Ford felt that the scan demonstrated total occlusions of the superficial femoral arteries bilaterally. The patient's symptoms were not immediately limb threatening.  However, there was significant claudication symptoms that stand to affect his lifestyle. He was offered intervention versus lifestyle modification and follow-up in 3 months. Patient elected to pursue conservative approach follow-up with vascular in 3 months.  During the interim,   Past Medical History:  Diagnosis Date  . Colon cancer ([place])   . Hypertension     Past Surgical History:  Procedure Laterality Date  . COLON SURGERY  2013    Family History  Problem Relation Age of Onset  . Cancer Maternal Grandfather     Social History:  reports that he has been smoking Cigarettes.  He has been smoking about 0.50 packs per day. He has never used smokeless tobacco. He reports that he does not drink  alcohol or use drugs.  He started smoking at age 74.  He has stopped smoking several times.  He was smoking 1pack/week.  He has cut back over the past month.  He lives in [place].  The patient is alone today.  Allergies: No Known Allergies  Current Medications: Current Outpatient Prescriptions  Medication Sig Dispense Refill  . amLODipine-benazepril (LOTREL) 5-20 MG capsule Take 1 capsule by mouth daily. Take one capsule every morning    . CELEBREX 200 MG capsule Take 200 mg by mouth daily.   0  . gabapentin (NEURONTIN) 300 MG capsule Take 1 capsule by mouth daily.   0  . L-Methylfolate-Algae-B12-B6 (FOLTANX RF) 3-90.314-2-35 MG CAPS   0  . LYRICA 75 MG capsule take 1 capsule by mouth twice a day 60 capsule 3  . sildenafil (REVATIO) 20 MG tablet Take 2 tablets by mouth as needed.  0   No current facility-administered medications for this visit.    Facility-Administered Medications Ordered in Other Visits  Medication Dose Route Frequency Provider Last Rate Last Dose  . sodium chloride 0.9 % injection 10 mL  10 mL Intravenous PRN William Stalls C, MD   10 mL at 12/13/14 1043  . sodium chloride flush (NS) 0.9 % injection 10 mL  10 mL Intravenous PRN William Asal, MD   10 mL at 08/20/16 1050    Review of Systems:  GENERAL:  Feels "fine".  No fevers or sweats.  Weight stable. PERFORMANCE STATUS (ECOG):  0 HEENT:  No visual changes, runny nose, sore throat,  mouth sores or tenderness. Lungs: No shortness of breath or cough.  No hemoptysis. Cardiac:  No chest pain, palpitations, orthopnea, or PND. GI:  Indigestion with some foods.  No RUQ pain.  No nausea, vomiting, diarrhea, constipation, melena or hematochezia. GU:  No urgency, frequency, dysuria, or hematuria. Musculoskeletal:  No back pain.  No joint pain.  No muscle tenderness. Extremities:  Leg muscles tight with walking.  No pain or swelling. Skin:  No rashes or skin changes. Neuro:  Oxaliplatin neuropathy (stable).  No  headache, weakness, balance or coordination issues. Endocrine:  No diabetes, thyroid issues, hot flashes or night sweats. Psych:  No mood changes, depression or anxiety. Pain:  No focal pain. Review of systems:  All other systems reviewed and found to be negative.  Physical Exam: There were no vitals taken for this visit. GENERAL:  Well developed, well nourished, gentleman sitting comfortably in the exam room in no acute distress. MENTAL STATUS:  Alert and oriented to person, place and time. HEAD:  Wearing a beige golf cap.  Alopecia.  William Ford.  Normocephalic, atraumatic, face symmetric, no Cushingoid features. EYES:  Glasses.  Brown eyes.  Pupils equal round and reactive to light and accomodation.  No conjunctivitis or scleral icterus. ENT:  Oropharynx clear without lesion.  Tongue normal. Mucous membranes moist.  RESPIRATORY:  Clear to auscultation without rales, wheezes or rhonchi. CARDIOVASCULAR:  Regular rate and rhythm without murmur, rub or gallop. ABDOMEN:  Soft, non-tender, with active bowel sounds, and no hepatosplenomegaly.  No masses. SKIN:  3.5 cm left anterior chest wall cyst.  Left facial cyst.  No rashes, ulcers or lesions. EXTREMITIES: Clubbing.  No edema, no skin discoloration or tenderness.  No palpable cords. LYMPH NODES: No palpable cervical, supraclavicular, axillary or inguinal adenopathy  NEUROLOGICAL: Unremarkable. PSYCH:  Appropriate.   No visits with results within 3 Day(s) from this visit.  Latest known visit with results is:  Appointment on 11/19/2016  Component Date Value Ref Range Status  . HCT 11/19/2016 51.6  40.0 - 52.0 % Final    Assessment:  William Ford is a 66 y.o. male with stage IIB colon cancer.  He underwent right colectomy on 11/05/2012.  Pathology revealed a T3N0M0 tumor in the cecum with 17 negative lymph nodes.  Circumferential margin was positive.  There was lymphovascular invasion. He had 1 deposit of metastases in peritoneum.   Mismatch repair was positive.  He received 12 cycles of FOLFOX chemotherapy from 12/31/2012 - 08/19/2013.  Chemotherapy was interrupted because of small bowel obstruction due to adhesions in 05/2013.  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  He has had an elevated CEA with negative imaging studies.  CEA was 10.7 on 12/31/2012, 13.4 on 06/17/2013, 11.7 on 07/18/2013, 8.3 on 09/28/2013, 8.0 on 12/30/2013, 9.6 on 05/17/2014, 9.8 on 10/25/2014, 10.3 on 05/09/2015, 10.4 on 07/25/2015, 8.4 on 08/08/2015, 8.8 on 11/07/2015, 8.3 on 02/06/2016, 18.8 on 05/28/2016, and 8.8 on 08/20/2016.    Last colonoscopy was in 2017 (unknown provider).   Chest, abdomen, and pelvic CT scan on 07/03/2014 revealed no evidence of metastatic disease.  There was a new irregular shaped pleural based pulmonary nodule in the periphery of the RUL most likely infectious/inflammatory. 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    Chest CT on 10/20/2014 revealed a stable small pleural-based area of nodularity in the right upper lobe (favor benign area of scarring). There was mild diffuse bronchial wall thickening with moderate centrilobular and mild paraseptal  emphysema; imaging findings suggestive of underlying COPD.  Chest CT on 07/19/2015 revealed no acute cardiopulmonary abnormalities.  He has a peripheral scar like density within the right upper lobe.   Chest, abdomen, and pelvic CT on 07/03/2016 revealed slight enlargement of the right upper lobe subpleural pulmonary nodule (3 mm to 4.6 mm).  There was persistent irregularity of the gallbladder wall. There was a stable 12 mm right adrenal nodule.  There was mild enlargement of the prostate gland. 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  He is followed by vascular surgery.  PSA was 0.71 on 07/23/2016.  Abdominal ultrasound on 07/14/2016 revealed  cholelithiasis without evidence of cholecystitis.  He has secondary polycythemia secondary to smoking. Work-up on 07/25/2015 revealed a hematocrit of 57.2, hemoglobin 19.2, MCV 95.1, platelets 169,000, WBC 8300 with an ANC of 4900.  Erythropoietin level was 6.0 (2.6-18.5).  Testosterone level was 680.  JAK2 was negative for V617F and exon 12.  Ferritin was 90, iron saturation 24% , and TIBC 372.    He undergoes small volume phlebotomies (250 cc) to maintain a hematocrit < 52 and a hemoglobin < 18.  With an elevated hematocrit, he feels sluggish/drowsy.  He feels better 2 days after his phlebotomies.  He underwent phlebotomy (500 cc) on 07/25/2015 with subsequent fatigue.  Last phlebotomy was on 06/25/2016.  Symptomatically,   Plan: 1.  Labs today: CBC with diff, CMP, CEA, ferritin. 2.  Continue port flushes every 6-8 weeks. 3.  Hematocrit is XXX. Goal is < 52. We will proceed with small volume phlebotomy today. 4.  Anticipate follow-up chest CT on 07/03/2017. 5.  RTC monthly for labs (hematocrit) and +/- therapeutic phlebotomy 6.  RTC in 4 months for MD assessment, labs (CBC with diff, CMP, CEA, ferritin) +/- phlebotomy.   Honor Loh, NP  12/30/2016, 4:21 PM   I saw and evaluated the patient, participating in the key portions of the service and reviewing pertinent diagnostic studies and records.  I reviewed the nurse practitioner's note and agree with the findings and the plan.  The assessment and plan were discussed with the patient.  Additional diagnostic studies of *** are needed to clarify *** and would change the clinical management.  A few ***multiple questions were asked by the patient and answered.   William Stalls, MD 12/31/2016,3:39 AM

## 2016-12-31 ENCOUNTER — Inpatient Hospital Stay: Payer: Medicare Other

## 2016-12-31 ENCOUNTER — Inpatient Hospital Stay: Payer: Medicare Other | Admitting: Hematology and Oncology

## 2017-01-06 NOTE — Progress Notes (Signed)
[facility] day:  01/07/2017   Chief Complaint: William Ford is a 66 y.o. male with stage IIB colon cancer and polycythemia who is seen for 4 month assessment.  HPI: The patient was last seen in the medical oncology clinic on 08/20/2016.  At that time, he denied any new symptoms. Patient complained of continued tightness in his legs and the sensation of his feet being cold at times. He continued to smoke, however he was actively trying to quit. He had cut back to one pack of cigarettes per week.  Hematocrit was 52.7.  He underwent a small volume phlebotomy. Chemistries were unremarkable. CEA was 8.8. The patient was noted to have occlusions in his superficial femoral arteries bilaterally.  He was referred to vascular for further evaluation.  Hematocrit has been monitored monthly. Patient did not require therapeutic phlebotomy in June, July, or August.  He was seen in consult on 11/04/2016 by Dr. Lucky Ford to evaluate his peripheral artery symptoms. Dr. Lucky Ford reviewed CT scan performed in 06/2016.  He felt that the scan demonstrated total occlusions of the superficial femoral arteries bilaterally. The patient's symptoms were not immediately limb threatening.  However, there was significant claudication symptoms that stand to affect his lifestyle. He was offered intervention versus lifestyle modification and follow-up in 3 months. Patient elected to pursue conservative approach follow-up with vascular in 3 months.  During the interim, patient is doing "so so". Patient with numbness in his feet. He notes that his previously reported leg symptoms has improved. Patient is attempting to stop smoking. He has not had a cigarette in 3-4 days. He is chewing gum. Patient denies any bleeding; no hematochezia, melena, or gross hematuria. He denies pain today in the clinic. Patient is eating well. He has gained 7 pounds.    Past Medical History:  Diagnosis Date  . Colon  cancer (William Ford)   . Hypertension     Past Surgical History:  Procedure Laterality Date  . COLON SURGERY  2013    Family History  Problem Relation Age of Onset  . Cancer Maternal Grandfather     Social History:  reports that he has been smoking Cigarettes.  He has been smoking about 0.50 packs per day. He has never used smokeless tobacco. He reports that he does not drink alcohol or use drugs.  He started smoking at age 38.  He has stopped smoking several times.  He was smoking 1pack/week.  He has cut back over the past month.  He lives in [place].  The patient is alone today.  Allergies: No Known Allergies  Current Medications: Current Outpatient Prescriptions  Medication Sig Dispense Refill  . CELEBREX 200 MG capsule Take 200 mg by mouth daily.   0  . gabapentin (NEURONTIN) 300 MG capsule Take 1 capsule by mouth daily.   0  . L-Methylfolate-Algae-B12-B6 (FOLTANX RF) 3-90.314-2-35 MG CAPS   0  . LYRICA 75 MG capsule take 1 capsule by mouth twice a day 60 capsule 3  . sildenafil (REVATIO) 20 MG tablet Take 2 tablets by mouth as needed.  0  . lisinopril (PRINIVIL,ZESTRIL) 20 MG tablet Take 20 mg by mouth daily.  0   No current facility-administered medications for this visit.    Facility-Administered Medications Ordered in Other Visits  Medication Dose Route Frequency Provider Last Rate Last Dose  . heparin lock flush 100 unit/mL  500 Units Intravenous Once William Asal, MD      . sodium  chloride 0.9 % injection 10 mL  10 mL Intravenous PRN William Stalls C, MD   10 mL at 12/13/14 1043  . sodium chloride flush (NS) 0.9 % injection 10 mL  10 mL Intravenous PRN William Stalls C, MD   10 mL at 08/20/16 1050  . sodium chloride flush (NS) 0.9 % injection 10 mL  10 mL Intravenous PRN William Asal, MD   10 mL at 01/07/17 0935    Review of Systems:  GENERAL:  Feels "so so".  No fevers or sweats.  Weight up 7 pounds.  PERFORMANCE STATUS (ECOG):  0 HEENT:  No visual  changes, runny nose, sore throat, mouth sores or tenderness. Lungs: No shortness of breath or cough.  No hemoptysis. Cardiac:  No chest pain, palpitations, orthopnea, or PND. GI:  Indigestion with some foods.  No RUQ pain.  No nausea, vomiting, diarrhea, constipation, melena or hematochezia. GU:  No urgency, frequency, dysuria, or hematuria. Musculoskeletal:  No back pain.  No joint pain.  No muscle tenderness. Extremities:  No pain or swelling. Skin:  No rashes or skin changes. Neuro:  Oxaliplatin neuropathy (stable).  No headache, weakness, balance or coordination issues. Endocrine:  No diabetes, thyroid issues, hot flashes or night sweats. Psych:  No mood changes, depression or anxiety. Pain:  No focal pain. Review of systems:  All other systems reviewed and found to be negative.  Physical Exam: Blood pressure 128/79, pulse 92, temperature (!) 97 F (36.1 Ford), temperature source Tympanic, weight 149 lb 0.5 oz (67.6 kg). GENERAL:  Well developed, well nourished, gentleman sitting comfortably in the exam room in no acute distress. MENTAL STATUS:  Alert and oriented to person, place and time. HEAD:  Alopecia.  [name].  Normocephalic, atraumatic, face symmetric, no Cushingoid features. EYES:  Glasses.  Brown eyes.  Pupils equal round and reactive to light and accomodation.  No conjunctivitis or scleral icterus. ENT:  Oropharynx clear without lesion.  Tongue normal. Mucous membranes moist.  RESPIRATORY:  Clear to auscultation without rales, wheezes or rhonchi. CARDIOVASCULAR:  Regular rate and rhythm without murmur, rub or gallop. ABDOMEN:  Soft, non-tender, with active bowel sounds, and no hepatosplenomegaly.  No masses. SKIN:  Left facial cyst.  No rashes, ulcers or lesions. EXTREMITIES: Clubbing.  No edema, no skin discoloration or tenderness.  No palpable cords. LYMPH NODES: No palpable cervical, supraclavicular, axillary or inguinal adenopathy  NEUROLOGICAL: Unremarkable. PSYCH:   Appropriate.   Infusion on 01/07/2017  Component Date Value Ref Range Status  . WBC 01/07/2017 8.1  3.8 - 10.6 K/uL Final  . RBC 01/07/2017 5.32  4.40 - 5.90 MIL/uL Final  . Hemoglobin 01/07/2017 17.4  13.0 - 18.0 g/dL Final  . HCT 01/07/2017 51.7  40.0 - 52.0 % Final  . MCV 01/07/2017 97.2  80.0 - 100.0 fL Final  . MCH 01/07/2017 32.7  26.0 - 34.0 pg Final  . MCHC 01/07/2017 33.6  32.0 - 36.0 g/dL Final  . RDW 01/07/2017 14.8* 11.5 - 14.5 % Final  . Platelets 01/07/2017 136* 150 - 440 K/uL Final  . Neutrophils Relative % 01/07/2017 59  % Final  . Neutro Abs 01/07/2017 4.7  1.4 - 6.5 K/uL Final  . Lymphocytes Relative 01/07/2017 31  % Final  . Lymphs Abs 01/07/2017 2.5  1.0 - 3.6 K/uL Final  . Monocytes Relative 01/07/2017 8  % Final  . Monocytes Absolute 01/07/2017 0.7  0.2 - 1.0 K/uL Final  . Eosinophils Relative 01/07/2017 1  % Final  .  Eosinophils Absolute 01/07/2017 0.1  0 - 0.7 K/uL Final  . Basophils Relative 01/07/2017 1  % Final  . Basophils Absolute 01/07/2017 0.1  0 - 0.1 K/uL Final  . Sodium 01/07/2017 137  135 - 145 mmol/L Final  . Potassium 01/07/2017 3.8  3.5 - 5.1 mmol/L Final  . Chloride 01/07/2017 106  101 - 111 mmol/L Final  . CO2 01/07/2017 25  22 - 32 mmol/L Final  . Glucose, Bld 01/07/2017 117* 65 - 99 mg/dL Final  . BUN 01/07/2017 13  6 - 20 mg/dL Final  . Creatinine, Ser 01/07/2017 1.20  0.61 - 1.24 mg/dL Final  . Calcium 01/07/2017 9.1  8.9 - 10.3 mg/dL Final  . Total Protein 01/07/2017 7.8  6.5 - 8.1 g/dL Final  . Albumin 01/07/2017 4.1  3.5 - 5.0 g/dL Final  . AST 01/07/2017 22  15 - 41 U/L Final  . ALT 01/07/2017 14* 17 - 63 U/L Final  . Alkaline Phosphatase 01/07/2017 87  38 - 126 U/L Final  . Total Bilirubin 01/07/2017 0.8  0.3 - 1.2 mg/dL Final  . GFR calc non Af Amer 01/07/2017 >60  >60 mL/min Final  . GFR calc Af Amer 01/07/2017 >60  >60 mL/min Final   Comment: (NOTE) The eGFR has been calculated using the CKD EPI equation. This calculation  has not been validated in all clinical situations. eGFR's persistently <60 mL/min signify possible Chronic Kidney Disease.   Georgiann Hahn gap 01/07/2017 6  5 - 15 Final    Assessment:  William Ford is a 66 y.o. male with stage IIB colon cancer.  He underwent right colectomy on 11/05/2012.  Pathology revealed a T3N0M0 tumor in the cecum with 17 negative lymph nodes.  Circumferential margin was positive.  There was lymphovascular invasion. He had 1 deposit of metastases in peritoneum.  Mismatch repair was positive.  He received 12 cycles of FOLFOX chemotherapy from 12/31/2012 - 08/19/2013.  Chemotherapy was interrupted because of small bowel obstruction due to adhesions in 05/2013.  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  He has had an elevated CEA with negative imaging studies.  CEA was 10.7 on 12/31/2012, 13.4 on 06/17/2013, 11.7 on 07/18/2013, 8.3 on 09/28/2013, 8.0 on 12/30/2013, 9.6 on 05/17/2014, 9.8 on 10/25/2014, 10.3 on 05/09/2015, 10.4 on 07/25/2015, 8.4 on 08/08/2015, 8.8 on 11/07/2015, 8.3 on 02/06/2016, 18.8 on 05/28/2016, and 8.8 on 08/20/2016.    Last colonoscopy was in 2017 (unknown provider).   Chest, abdomen, and pelvic CT scan on 07/03/2014 revealed no evidence of metastatic disease.  There was a new irregular shaped pleural based pulmonary nodule in the periphery of the RUL most likely infectious/inflammatory. 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    Chest CT on 10/20/2014 revealed a stable small pleural-based area of nodularity in the right upper lobe (favor benign area of scarring). There was mild diffuse bronchial wall thickening with moderate centrilobular and mild paraseptal emphysema; imaging findings suggestive of underlying COPD.  Chest CT on 07/19/2015 revealed no acute cardiopulmonary abnormalities.  He has a peripheral scar like density within the right upper lobe.   Chest, abdomen, and pelvic CT on 07/03/2016 revealed slight enlargement of  the right upper lobe subpleural pulmonary nodule (3 mm to 4.6 mm).  There was persistent irregularity of the gallbladder wall. There was a stable 12 mm right adrenal nodule.  There was mild enlargement of the prostate gland. There was calcific atherosclerotic disease of the aorta with persistent near  complete occlusion of the proximal superficial femoral arteries bilaterally.  He is followed by vascular surgery.  PSA was 0.71 on 07/23/2016.  Abdominal ultrasound on 07/14/2016 revealed cholelithiasis without evidence of cholecystitis.  He has secondary polycythemia secondary to smoking. Work-up on 07/25/2015 revealed a hematocrit of 57.2, hemoglobin 19.2, MCV 95.1, platelets 169,000, WBC 8300 with an ANC of 4900.  Erythropoietin level was 6.0 (2.6-18.5).  Testosterone level was 680.  JAK2 was negative for V617F and exon 12.  Ferritin was 90, iron saturation 24% , and TIBC 372.    He undergoes small volume phlebotomies (250 cc) to maintain a hematocrit < 52 and a hemoglobin < 18.  With an elevated hematocrit, he feels sluggish/drowsy.  He feels better 2 days after his phlebotomies.  He underwent phlebotomy (500 cc) on 07/25/2015 with subsequent fatigue.  Last phlebotomy was on 06/25/2016.  Symptomatically, patient is doing "so so". Patient with numbness in his feet.  He notes that his previously reported leg symptoms has improved. He is trying to stop smoking.  Exam is unremarkable. Hematocrit is 51.7. CEA is 8.8.  Plan: 1.  Labs today: CBC with diff, CMP, CEA, ferritin. 2.  Continue port flushes every 6-8 weeks. 3.  Hematocrit is 51.7. Goal is < 52. Patient will not require a small volume phlebotomy today. 4.  Schedule chest CT to follow up lung nodules on 01/14/2017. 5.  Anticipate follow-up chest, abdomen, pelvic CT with contrast yearly (due 07/03/2017). 6.  Encourage ongoing smoking cessation.  If he stops smoking permanently, he will likely not require future phlebotomies. 7.  RTC every other  month for labs (hematocrit) and +/- therapeutic phlebotomy 8.  RTC in 4 months for MD assessment, labs (CBC with diff, CMP, CEA, ferritin) +/- phlebotomy.   Honor Loh, NP  01/07/2017, 10:05 AM   I saw and evaluated the patient, participating in the key portions of the service and reviewing pertinent diagnostic studies and records.  I reviewed the nurse practitioner's note and agree with the findings and the plan.  The assessment and plan were discussed with the patient.  Additional diagnostic studies of a chest CT are needed to follow-up on subpleural lung nodule and would change the clinical management.  A few questions were asked by the patient and answered.   William Stalls, MD 01/07/2017,10:05 AM

## 2017-01-07 ENCOUNTER — Inpatient Hospital Stay: Payer: Medicare Other

## 2017-01-07 ENCOUNTER — Inpatient Hospital Stay: Payer: Medicare Other | Attending: Hematology and Oncology | Admitting: Hematology and Oncology

## 2017-01-07 ENCOUNTER — Other Ambulatory Visit: Payer: Self-pay | Admitting: *Deleted

## 2017-01-07 ENCOUNTER — Encounter: Payer: Self-pay | Admitting: Hematology and Oncology

## 2017-01-07 VITALS — BP 128/79 | HR 92 | Temp 97.0°F | Wt 149.0 lb

## 2017-01-07 DIAGNOSIS — G629 Polyneuropathy, unspecified: Secondary | ICD-10-CM | POA: Diagnosis not present

## 2017-01-07 DIAGNOSIS — I10 Essential (primary) hypertension: Secondary | ICD-10-CM | POA: Diagnosis not present

## 2017-01-07 DIAGNOSIS — Z9049 Acquired absence of other specified parts of digestive tract: Secondary | ICD-10-CM | POA: Diagnosis not present

## 2017-01-07 DIAGNOSIS — R911 Solitary pulmonary nodule: Secondary | ICD-10-CM

## 2017-01-07 DIAGNOSIS — I739 Peripheral vascular disease, unspecified: Secondary | ICD-10-CM | POA: Diagnosis not present

## 2017-01-07 DIAGNOSIS — Z79899 Other long term (current) drug therapy: Secondary | ICD-10-CM | POA: Diagnosis not present

## 2017-01-07 DIAGNOSIS — Z9221 Personal history of antineoplastic chemotherapy: Secondary | ICD-10-CM | POA: Diagnosis not present

## 2017-01-07 DIAGNOSIS — D751 Secondary polycythemia: Secondary | ICD-10-CM

## 2017-01-07 DIAGNOSIS — K565 Intestinal adhesions [bands], unspecified as to partial versus complete obstruction: Secondary | ICD-10-CM | POA: Insufficient documentation

## 2017-01-07 DIAGNOSIS — N400 Enlarged prostate without lower urinary tract symptoms: Secondary | ICD-10-CM | POA: Diagnosis not present

## 2017-01-07 DIAGNOSIS — I700 Atherosclerosis of aorta: Secondary | ICD-10-CM | POA: Insufficient documentation

## 2017-01-07 DIAGNOSIS — K8020 Calculus of gallbladder without cholecystitis without obstruction: Secondary | ICD-10-CM | POA: Insufficient documentation

## 2017-01-07 DIAGNOSIS — C180 Malignant neoplasm of cecum: Secondary | ICD-10-CM | POA: Diagnosis present

## 2017-01-07 DIAGNOSIS — C786 Secondary malignant neoplasm of retroperitoneum and peritoneum: Secondary | ICD-10-CM | POA: Insufficient documentation

## 2017-01-07 DIAGNOSIS — F17210 Nicotine dependence, cigarettes, uncomplicated: Secondary | ICD-10-CM | POA: Insufficient documentation

## 2017-01-07 DIAGNOSIS — R970 Elevated carcinoembryonic antigen [CEA]: Secondary | ICD-10-CM

## 2017-01-07 DIAGNOSIS — C182 Malignant neoplasm of ascending colon: Secondary | ICD-10-CM

## 2017-01-07 LAB — CBC WITH DIFFERENTIAL/PLATELET
Basophils Absolute: 0.1 10*3/uL (ref 0–0.1)
Basophils Relative: 1 %
Eosinophils Absolute: 0.1 10*3/uL (ref 0–0.7)
Eosinophils Relative: 1 %
HCT: 51.7 % (ref 40.0–52.0)
Hemoglobin: 17.4 g/dL (ref 13.0–18.0)
Lymphocytes Relative: 31 %
Lymphs Abs: 2.5 10*3/uL (ref 1.0–3.6)
MCH: 32.7 pg (ref 26.0–34.0)
MCHC: 33.6 g/dL (ref 32.0–36.0)
MCV: 97.2 fL (ref 80.0–100.0)
Monocytes Absolute: 0.7 10*3/uL (ref 0.2–1.0)
Monocytes Relative: 8 %
Neutro Abs: 4.7 10*3/uL (ref 1.4–6.5)
Neutrophils Relative %: 59 %
Platelets: 136 10*3/uL — ABNORMAL LOW (ref 150–440)
RBC: 5.32 MIL/uL (ref 4.40–5.90)
RDW: 14.8 % — ABNORMAL HIGH (ref 11.5–14.5)
WBC: 8.1 10*3/uL (ref 3.8–10.6)

## 2017-01-07 LAB — COMPREHENSIVE METABOLIC PANEL
ALT: 14 U/L — ABNORMAL LOW (ref 17–63)
AST: 22 U/L (ref 15–41)
Albumin: 4.1 g/dL (ref 3.5–5.0)
Alkaline Phosphatase: 87 U/L (ref 38–126)
Anion gap: 6 (ref 5–15)
BUN: 13 mg/dL (ref 6–20)
CO2: 25 mmol/L (ref 22–32)
Calcium: 9.1 mg/dL (ref 8.9–10.3)
Chloride: 106 mmol/L (ref 101–111)
Creatinine, Ser: 1.2 mg/dL (ref 0.61–1.24)
GFR calc Af Amer: >60 mL/min (ref >60)
GFR calc non Af Amer: >60 mL/min (ref >60)
Glucose, Bld: 117 mg/dL — ABNORMAL HIGH (ref 65–99)
Potassium: 3.8 mmol/L (ref 3.5–5.1)
Sodium: 137 mmol/L (ref 135–145)
Total Bilirubin: 0.8 mg/dL (ref 0.3–1.2)
Total Protein: 7.8 g/dL (ref 6.5–8.1)

## 2017-01-07 LAB — FERRITIN: Ferritin: 38 ng/mL (ref 24–336)

## 2017-01-07 MED ORDER — HEPARIN SOD (PORK) LOCK FLUSH 100 UNIT/ML IV SOLN
500.0000 [IU] | Freq: Once | INTRAVENOUS | Status: AC
  Administered 2017-01-07: 500 [IU] via INTRAVENOUS

## 2017-01-07 MED ORDER — HEPARIN SOD (PORK) LOCK FLUSH 100 UNIT/ML IV SOLN
INTRAVENOUS | Status: AC
  Filled 2017-01-07: qty 5

## 2017-01-07 MED ORDER — SODIUM CHLORIDE 0.9% FLUSH
10.0000 mL | INTRAVENOUS | Status: DC | PRN
  Administered 2017-01-07: 10 mL via INTRAVENOUS
  Filled 2017-01-07: qty 10

## 2017-01-07 NOTE — Progress Notes (Signed)
Patient states he sometimes feels queasy after eating a meal.  States he noticed it more the end of last week and the first of this week.  States it is getting better.  Patient also states he has "little bumps" that are coming up on his skin.  Mostly his hands and arms.  States they itch when they first come up.

## 2017-01-08 LAB — CEA: CEA: 8.9 ng/mL — ABNORMAL HIGH (ref 0.0–4.7)

## 2017-01-14 ENCOUNTER — Ambulatory Visit
Admission: RE | Admit: 2017-01-14 | Discharge: 2017-01-14 | Disposition: A | Discharge status: Home / Self Care | Payer: Medicare Other | Source: Ambulatory Visit | Attending: Urgent Care | Admitting: Urgent Care

## 2017-01-14 DIAGNOSIS — R970 Elevated carcinoembryonic antigen [CEA]: Secondary | ICD-10-CM | POA: Diagnosis not present

## 2017-01-14 DIAGNOSIS — C182 Malignant neoplasm of ascending colon: Secondary | ICD-10-CM

## 2017-01-14 DIAGNOSIS — R918 Other nonspecific abnormal finding of lung field: Secondary | ICD-10-CM | POA: Diagnosis not present

## 2017-01-14 DIAGNOSIS — J432 Centrilobular emphysema: Secondary | ICD-10-CM | POA: Insufficient documentation

## 2017-01-14 DIAGNOSIS — L989 Disorder of the skin and subcutaneous tissue, unspecified: Secondary | ICD-10-CM | POA: Insufficient documentation

## 2017-01-21 ENCOUNTER — Encounter: Payer: Self-pay | Admitting: *Deleted

## 2017-01-21 ENCOUNTER — Other Ambulatory Visit: Payer: Medicare Other

## 2017-01-21 ENCOUNTER — Ambulatory Visit: Payer: Medicare Other | Admitting: Hematology and Oncology

## 2017-01-28 NOTE — Discharge Instructions (Signed)

## 2017-02-02 ENCOUNTER — Ambulatory Visit: Payer: Medicare Other | Admitting: Anesthesiology

## 2017-02-02 ENCOUNTER — Ambulatory Visit
Admission: RE | Admit: 2017-02-02 | Discharge: 2017-02-02 | Disposition: A | Discharge status: Home / Self Care | Payer: Medicare Other | Source: Ambulatory Visit | Attending: Ophthalmology | Admitting: Ophthalmology

## 2017-02-02 ENCOUNTER — Encounter
Admission: RE | Disposition: A | Discharge status: Home / Self Care | Payer: Self-pay | Source: Ambulatory Visit | Attending: Ophthalmology

## 2017-02-02 DIAGNOSIS — Z85038 Personal history of other malignant neoplasm of large intestine: Secondary | ICD-10-CM | POA: Diagnosis not present

## 2017-02-02 DIAGNOSIS — J439 Emphysema, unspecified: Secondary | ICD-10-CM | POA: Diagnosis not present

## 2017-02-02 DIAGNOSIS — I10 Essential (primary) hypertension: Secondary | ICD-10-CM | POA: Diagnosis not present

## 2017-02-02 DIAGNOSIS — Z9221 Personal history of antineoplastic chemotherapy: Secondary | ICD-10-CM | POA: Insufficient documentation

## 2017-02-02 DIAGNOSIS — F17200 Nicotine dependence, unspecified, uncomplicated: Secondary | ICD-10-CM | POA: Insufficient documentation

## 2017-02-02 DIAGNOSIS — H2511 Age-related nuclear cataract, right eye: Secondary | ICD-10-CM | POA: Insufficient documentation

## 2017-02-02 DIAGNOSIS — M1990 Unspecified osteoarthritis, unspecified site: Secondary | ICD-10-CM | POA: Diagnosis not present

## 2017-02-02 DIAGNOSIS — Z89429 Acquired absence of other toe(s), unspecified side: Secondary | ICD-10-CM | POA: Insufficient documentation

## 2017-02-02 HISTORY — DX: Anesthesia of skin: R20.0

## 2017-02-02 HISTORY — DX: Unspecified osteoarthritis, unspecified site: M19.90

## 2017-02-02 HISTORY — PX: CATARACT EXTRACTION W/PHACO: SHX586

## 2017-02-02 HISTORY — DX: Emphysema, unspecified: J43.9

## 2017-02-02 SURGERY — PHACOEMULSIFICATION, CATARACT, WITH IOL INSERTION
Anesthesia: Monitor Anesthesia Care | Laterality: Right

## 2017-02-02 MED ORDER — MIDAZOLAM HCL 2 MG/2ML IJ SOLN
INTRAMUSCULAR | Status: DC | PRN
  Administered 2017-02-02: 2 mg via INTRAVENOUS

## 2017-02-02 MED ORDER — LIDOCAINE HCL (PF) 2 % IJ SOLN
INTRAOCULAR | Status: DC | PRN
  Administered 2017-02-02: 1 mL via INTRAMUSCULAR

## 2017-02-02 MED ORDER — ARMC OPHTHALMIC DILATING DROPS
1.0000 "application " | OPHTHALMIC | Status: DC | PRN
  Administered 2017-02-02 (×3): 1 via OPHTHALMIC

## 2017-02-02 MED ORDER — ACETAMINOPHEN 325 MG PO TABS: 325.0000 mg | ORAL_TABLET | Freq: Once | ORAL | Status: DC

## 2017-02-02 MED ORDER — NA HYALUR & NA CHOND-NA HYALUR 0.4-0.35 ML IO KIT
PACK | INTRAOCULAR | Status: DC | PRN
  Administered 2017-02-02: 1 mL via INTRAOCULAR

## 2017-02-02 MED ORDER — EPINEPHRINE PF 1 MG/ML IJ SOLN
INTRAOCULAR | Status: DC | PRN
  Administered 2017-02-02: 63 mL via OPHTHALMIC

## 2017-02-02 MED ORDER — MOXIFLOXACIN HCL 0.5 % OP SOLN
1.0000 [drp] | OPHTHALMIC | Status: DC | PRN
  Administered 2017-02-02 (×3): 1 [drp] via OPHTHALMIC

## 2017-02-02 MED ORDER — ACETAMINOPHEN 160 MG/5ML PO SOLN: 325.0000 mg | Freq: Once | ORAL | Status: DC

## 2017-02-02 MED ORDER — BRIMONIDINE TARTRATE-TIMOLOL 0.2-0.5 % OP SOLN
OPHTHALMIC | Status: DC | PRN
  Administered 2017-02-02: 1 [drp] via OPHTHALMIC

## 2017-02-02 MED ORDER — LACTATED RINGERS IV SOLN: INTRAVENOUS | Status: DC

## 2017-02-02 MED ORDER — CEFUROXIME OPHTHALMIC INJECTION 1 MG/0.1 ML
INJECTION | OPHTHALMIC | Status: DC | PRN
  Administered 2017-02-02: 0.1 mL via INTRACAMERAL

## 2017-02-02 MED ORDER — FENTANYL CITRATE (PF) 100 MCG/2ML IJ SOLN
INTRAMUSCULAR | Status: DC | PRN
  Administered 2017-02-02 (×2): 50 ug via INTRAVENOUS

## 2017-02-02 SURGICAL SUPPLY — 25 items
CANNULA ANT/CHMB 27GA (MISCELLANEOUS) ×3 IMPLANT
CARTRIDGE ABBOTT (MISCELLANEOUS) IMPLANT
GLOVE SURG LX 7.5 STRW (GLOVE) ×2
GLOVE SURG LX STRL 7.5 STRW (GLOVE) ×1 IMPLANT
GLOVE SURG TRIUMPH 8.0 PF LTX (GLOVE) ×3 IMPLANT
GOWN STRL REUS W/ TWL LRG LVL3 (GOWN DISPOSABLE) ×2 IMPLANT
GOWN STRL REUS W/TWL LRG LVL3 (GOWN DISPOSABLE) ×4
LENS IOL TECNIS ITEC 20.0 (Intraocular Lens) ×3 IMPLANT
MARKER SKIN DUAL TIP RULER LAB (MISCELLANEOUS) ×3 IMPLANT
NDL RETROBULBAR .5 NSTRL (NEEDLE) IMPLANT
NEEDLE FILTER BLUNT 18X 1/2SAF (NEEDLE) ×2
NEEDLE FILTER BLUNT 18X1 1/2 (NEEDLE) ×1 IMPLANT
PACK CATARACT BRASINGTON (MISCELLANEOUS) ×3 IMPLANT
PACK EYE AFTER SURG (MISCELLANEOUS) ×3 IMPLANT
PACK OPTHALMIC (MISCELLANEOUS) ×3 IMPLANT
RING MALYGIN 7.0 (MISCELLANEOUS) IMPLANT
SUT ETHILON 10-0 CS-B-6CS-B-6 (SUTURE)
SUT VICRYL  9 0 (SUTURE)
SUT VICRYL 9 0 (SUTURE) IMPLANT
SUTURE EHLN 10-0 CS-B-6CS-B-6 (SUTURE) IMPLANT
SYR 3ML LL SCALE MARK (SYRINGE) ×3 IMPLANT
SYR 5ML LL (SYRINGE) ×3 IMPLANT
SYR TB 1ML LUER SLIP (SYRINGE) ×3 IMPLANT
WATER STERILE IRR 250ML POUR (IV SOLUTION) ×3 IMPLANT
WIPE NON LINTING 3.25X3.25 (MISCELLANEOUS) ×3 IMPLANT

## 2017-02-02 NOTE — Anesthesia Preprocedure Evaluation (Signed)
Anesthesia Evaluation  Patient identified by MRN, date of birth, ID band Patient awake    Reviewed: Allergy & Precautions, H&P , NPO status , Patient's Chart, lab work & pertinent test results  Airway Mallampati: II  TM Distance: >3 FB Neck ROM: full    Dental  (+) Loose, Missing, Chipped, Poor Dentition   Pulmonary COPD, Current Smoker,    Pulmonary exam normal breath sounds clear to auscultation       Cardiovascular hypertension, Normal cardiovascular exam Rhythm:regular Rate:Normal     Neuro/Psych    GI/Hepatic   Endo/Other    Renal/GU      Musculoskeletal   Abdominal   Peds  Hematology   Anesthesia Other Findings   Reproductive/Obstetrics                             Anesthesia Physical  Anesthesia Plan  ASA: III  Anesthesia Plan: MAC   Post-op Pain Management:    Induction:   PONV Risk Score and Plan: 0 and Midazolam  Airway Management Planned:   Additional Equipment:   Intra-op Plan:   Post-operative Plan:   Informed Consent: I have reviewed the patients History and Physical, chart, labs and discussed the procedure including the risks, benefits and alternatives for the proposed anesthesia with the patient or authorized representative who has indicated his/her understanding and acceptance.     Plan Discussed with: CRNA  Anesthesia Plan Comments:         Anesthesia Quick Evaluation  

## 2017-02-02 NOTE — Transfer of Care (Signed)
Immediate Anesthesia Transfer of Care Note  Patient: William Ford  Procedure(s) Performed: CATARACT EXTRACTION PHACO AND INTRAOCULAR LENS PLACEMENT (IOC) RIGHT (Right )  Patient Location: PACU  Anesthesia Type: MAC  Level of Consciousness: awake, alert  and patient cooperative  Airway and Oxygen Therapy: Patient Spontanous Breathing and Patient connected to supplemental oxygen  Post-op Assessment: Post-op Vital signs reviewed, Patient's Cardiovascular Status Stable, Respiratory Function Stable, Patent Airway and No signs of Nausea or vomiting  Post-op Vital Signs: Reviewed and stable  Complications: No apparent anesthesia complications

## 2017-02-02 NOTE — Anesthesia Postprocedure Evaluation (Signed)
Anesthesia Post Note  Patient: William Ford  Procedure(s) Performed: CATARACT EXTRACTION PHACO AND INTRAOCULAR LENS PLACEMENT (IOC) RIGHT (Right )  Patient location during evaluation: PACU Anesthesia Type: MAC Level of consciousness: awake and alert and oriented Pain management: satisfactory to patient Vital Signs Assessment: post-procedure vital signs reviewed and stable Respiratory status: spontaneous breathing, nonlabored ventilation and respiratory function stable Cardiovascular status: blood pressure returned to baseline and stable Postop Assessment: Adequate PO intake and No signs of nausea or vomiting Anesthetic complications: no    Raliegh Ip

## 2017-02-02 NOTE — Op Note (Signed)
LOCATION:  [place]   PREOPERATIVE DIAGNOSIS:    Nuclear sclerotic cataract right eye. H25.11   POSTOPERATIVE DIAGNOSIS:  Nuclear sclerotic cataract right eye.     PROCEDURE:  Phacoemusification with posterior chamber intraocular lens placement of the right eye   LENS:   Implant Name Type Inv. Item Serial No. Manufacturer Lot No. LRB No. Used  LENS IOL DIOP 20.0 - [id number] Intraocular Lens LENS IOL DIOP 20.0 [id number] AMO  Right 1        ULTRASOUND TIME: 14 % of 1 minutes, 7 seconds.  CDE 9.3   SURGEON:  Wyonia Hough, MD   ANESTHESIA:  Topical with tetracaine drops and 2% Xylocaine jelly, augmented with 1% preservative-free intracameral lidocaine.    COMPLICATIONS:  None.   DESCRIPTION OF PROCEDURE:  The patient was identified in the holding room and transported to the operating room and placed in the supine position under the operating microscope.  The right eye was identified as the operative eye and it was prepped and draped in the usual sterile ophthalmic fashion.   A 1 millimeter clear-corneal paracentesis was made at the 12:00 position.  0.5 ml of preservative-free 1% lidocaine was injected into the anterior chamber. The anterior chamber was filled with Viscoat viscoelastic.  A 2.4 millimeter keratome was used to make a near-clear corneal incision at the 9:00 position.  A curvilinear capsulorrhexis was made with a cystotome and capsulorrhexis forceps.  Balanced salt solution was used to hydrodissect and hydrodelineate the nucleus.   Phacoemulsification was then used in stop and chop fashion to remove the lens nucleus and epinucleus.  The remaining cortex was then removed using the irrigation and aspiration handpiece. Provisc was then placed into the capsular bag to distend it for lens placement.  A lens was then injected into the capsular bag.  The remaining viscoelastic was aspirated.   Wounds were hydrated with balanced salt solution.  The anterior  chamber was inflated to a physiologic pressure with balanced salt solution.  No wound leaks were noted. Cefuroxime 0.1 ml of a 10mg /ml solution was injected into the anterior chamber for a dose of 1 mg of intracameral antibiotic at the completion of the case.   Timolol and Brimonidine drops were applied to the eye.  The patient was taken to the recovery room in stable condition without complications of anesthesia or surgery.   Lashawnda Hancox 02/02/2017, 10:49 AM

## 2017-02-02 NOTE — Anesthesia Procedure Notes (Signed)
Procedure Name: MAC Date/Time: 02/02/2017 10:28 AM Performed by: Cameron Ali, CRNA Pre-anesthesia Checklist: Patient identified, Emergency Drugs available, Suction available, Timeout performed and Patient being monitored Patient Re-evaluated:Patient Re-evaluated prior to induction Oxygen Delivery Method: Nasal cannula Placement Confirmation: positive ETCO2

## 2017-02-02 NOTE — H&P (Signed)
The History and Physical notes are on paper, have been signed, and are to be scanned. The patient remains stable and unchanged from the H&P.   Previous H&P reviewed, patient examined, and there are no changes.  William Ford 02/02/2017 9:17 AM

## 2017-02-03 ENCOUNTER — Encounter: Payer: Self-pay | Admitting: Ophthalmology

## 2017-02-13 ENCOUNTER — Ambulatory Visit (INDEPENDENT_AMBULATORY_CARE_PROVIDER_SITE_OTHER): Payer: Medicare Other

## 2017-02-13 ENCOUNTER — Ambulatory Visit (INDEPENDENT_AMBULATORY_CARE_PROVIDER_SITE_OTHER): Payer: Medicare Other | Admitting: Vascular Surgery

## 2017-02-13 ENCOUNTER — Encounter (INDEPENDENT_AMBULATORY_CARE_PROVIDER_SITE_OTHER): Payer: Self-pay | Admitting: Vascular Surgery

## 2017-02-13 VITALS — BP 148/87 | HR 90 | Resp 16 | Ht 73.0 in | Wt 150.0 lb

## 2017-02-13 DIAGNOSIS — I70213 Atherosclerosis of native arteries of extremities with intermittent claudication, bilateral legs: Secondary | ICD-10-CM

## 2017-02-13 DIAGNOSIS — D751 Secondary polycythemia: Secondary | ICD-10-CM | POA: Diagnosis not present

## 2017-02-13 DIAGNOSIS — I10 Essential (primary) hypertension: Secondary | ICD-10-CM

## 2017-02-13 DIAGNOSIS — F17210 Nicotine dependence, cigarettes, uncomplicated: Secondary | ICD-10-CM | POA: Diagnosis not present

## 2017-02-13 DIAGNOSIS — F17200 Nicotine dependence, unspecified, uncomplicated: Secondary | ICD-10-CM

## 2017-02-13 NOTE — Patient Instructions (Signed)

## 2017-02-13 NOTE — Progress Notes (Signed)
MRN : [id number]  William Ford is a 66 y.o. (03/05/1951) male who presents with chief complaint of  Chief Complaint  Patient presents with  . Follow-up    45moabi,bil le art  .  History of Present Illness: Patient returns in follow up for PAD.  He has been exercising more and has noticed improvement in his claudication symptoms bilaterally.  It is still present, but the symptoms are not as severe and it takes more activity to bring on the symptoms.  No ulceration or infection.  He continues to smoke and understands he needs to quit.  His ABIs today are 0.64 on the right 0.67 on the left.  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    Past Medical History:  Diagnosis Date  . Arthritis    hands  . [place]   . Emphysema of lung (HCC)    mild (per pt)  . Hypertension   . Numbness    toes and fingers (S/P chemo - per pt)    Past Surgical History:  Procedure Laterality Date  . AMPUTATION Right 2003   4th   . CATARACT EXTRACTION PHACO AND INTRAOCULAR LENS PLACEMENT ([facility] RIGHT Right 02/02/2017   [facility] [facility]          Family History  Problem Relation Age of Onset  . Cancer Maternal Grandfather   No bleeding disorders, clotting disorders, aneurysms, or autoimmune diseases  Social History      Social History  Substance Use Topics  . Smoking status: Current Every Day Smoker    Packs/day: 0.50    Types: Cigarettes  . Smokeless tobacco: Never Used  . Alcohol use No  No IVDU  No Known Allergies        Current Outpatient Prescriptions  Medication Sig Dispense Refill  . amLODipine-benazepril (LOTREL) 5-20 MG capsule Take 1 capsule by mouth daily. Take one capsule every morning    . CELEBREX 200 MG capsule Take 200 mg by mouth daily.   0  . gabapentin (NEURONTIN) 300 MG capsule Take 1 capsule by mouth daily.   0  . L-Methylfolate-Algae-B12-B6 (FOLTANX  RF) 3-90.314-2-35 MG CAPS   0  . LYRICA 75 MG capsule take 1 capsule by mouth twice a day 60 capsule 3  . sildenafil (REVATIO) 20 MG tablet Take 2 tablets by mouth as needed.  0   No current facility-administered medications for this visit.             Facility-Administered Medications Ordered in Other Visits  Medication Dose Route Frequency Provider Last Rate Last Dose  . sodium chloride 0.9 % injection 10 mL  10 mL Intravenous PRN CNolon StallsC, MD   10 mL at 12/13/14 1043  . sodium chloride flush (NS) 0.9 % injection 10 mL  10 mL Intravenous PRN CLequita Asal MD   10 mL at 08/20/16 1050      REVIEW OF SYSTEMS (Negative unless checked)  Constitutional: [] Weight loss  [] Fever  [] Chills Cardiac: [] Chest pain   [] Chest pressure   [] Palpitations   [] Shortness of breath when laying flat   [] Shortness of breath at rest   [] Shortness of breath with exertion. Vascular:  [x] Pain in legs with walking   [] Pain in legs at rest   [] Pain in legs when laying flat   [x] Claudication   [] Pain in feet when walking  [] Pain in feet at rest  [] Pain  in feet when laying flat   [] History of DVT   [] Phlebitis   [] Swelling in legs   [] Varicose veins   [] Non-healing ulcers Pulmonary:   [] Uses home oxygen   [] Productive cough   [] Hemoptysis   [] Wheeze  [] COPD   [] Asthma Neurologic:  [] Dizziness  [] Blackouts   [] Seizures   [] History of stroke   [] History of TIA  [] Aphasia   [] Temporary blindness   [] Dysphagia   [] Weakness or numbness in arms   [] Weakness or numbness in legs Musculoskeletal:  [] Arthritis   [] Joint swelling   [] Joint pain   [] Low back pain Hematologic:  [] Easy bruising  [] Easy bleeding   [] Hypercoagulable state   [] Anemic  [] Hepatitis Gastrointestinal:  [] Blood in stool   [] Vomiting blood  [] Gastroesophageal reflux/heartburn   [] Abdominal pain Genitourinary:  [] Chronic kidney disease   [] Difficult urination  [] Frequent urination  [] Burning with urination   [] Hematuria Skin:   [] Rashes   [] Ulcers   [] Wounds Psychological:  [] History of anxiety   []  History of major depression.    Physical Examination  Vitals:   02/13/17 1156  BP: (!) 148/87  Pulse: 90  Resp: 16  Weight: 150 lb (68 kg)  Height: 6' 1"  (1.854 m)   Body mass index is 19.79 kg/m. Gen:  WD/WN, NAD, appears younger than stated age Head: [place]/AT, No temporalis wasting. Ear/Nose/Throat: Hearing grossly intact, trachea midline Eyes: Conjunctiva clear. Sclera non-icteric Neck: Supple.  No JVD. Trachea midline Pulmonary:  Good air movement, respirations not labored, no use of accessory muscles.  Cardiac: RRR, normal S1, S2. Vascular: 1+ pedal pulses bilaterally. Vessel Right Left  Radial Palpable Palpable                                    Musculoskeletal: M/S 5/5 throughout.  No deformity or atrophy.  No edema. Neurologic: Sensation grossly intact in extremities.  Symmetrical.  Speech is fluent. Psychiatric: Judgment intact, Mood & affect appropriate for pt's clinical situation. Dermatologic: No rashes or ulcers noted.  No cellulitis or open wounds.      Labs Recent Results (from the past 2160 hour(s))  Hematocrit     Status: None   Collection Time: 11/19/16  9:59 AM  Result Value Ref Range   HCT 51.6 40.0 - 52.0 %  CBC with Differential/Platelet     Status: Abnormal   Collection Time: 01/07/17  9:31 AM  Result Value Ref Range   WBC 8.1 3.8 - 10.6 K/uL   RBC 5.32 4.40 - 5.90 MIL/uL   Hemoglobin 17.4 13.0 - 18.0 g/dL   HCT 51.7 40.0 - 52.0 %   MCV 97.2 80.0 - 100.0 fL   MCH 32.7 26.0 - 34.0 pg   MCHC 33.6 32.0 - 36.0 g/dL   RDW 14.8 (H) 11.5 - 14.5 %   Platelets 136 (L) 150 - 440 K/uL   Neutrophils Relative % 59 %   Neutro Abs 4.7 1.4 - 6.5 K/uL   Lymphocytes Relative 31 %   Lymphs Abs 2.5 1.0 - 3.6 K/uL   Monocytes Relative 8 %   Monocytes Absolute 0.7 0.2 - 1.0 K/uL   Eosinophils Relative 1 %   Eosinophils Absolute 0.1 0 - 0.7 K/uL   Basophils Relative 1 %    Basophils Absolute 0.1 0 - 0.1 K/uL  Comprehensive metabolic panel     Status: Abnormal   Collection Time: 01/07/17  9:31 AM  Result Value  Ref Range   Sodium 137 135 - 145 mmol/L   Potassium 3.8 3.5 - 5.1 mmol/L   Chloride 106 101 - 111 mmol/L   CO2 25 22 - 32 mmol/L   Glucose, Bld 117 (H) 65 - 99 mg/dL   BUN 13 6 - 20 mg/dL   Creatinine, Ser 1.20 0.61 - 1.24 mg/dL   Calcium 9.1 8.9 - 10.3 mg/dL   Total Protein 7.8 6.5 - 8.1 g/dL   Albumin 4.1 3.5 - 5.0 g/dL   AST 22 15 - 41 U/L   ALT 14 (L) 17 - 63 U/L   Alkaline Phosphatase 87 38 - 126 U/L   Total Bilirubin 0.8 0.3 - 1.2 mg/dL   GFR calc non Af Amer >60 >60 mL/min   GFR calc Af Amer >60 >60 mL/min    Comment: (NOTE) The eGFR has been calculated using the CKD EPI equation. This calculation has not been validated in all clinical situations. eGFR's persistently <60 mL/min signify possible Chronic Kidney Disease.    Anion gap 6 5 - 15  Ferritin     Status: None   Collection Time: 01/07/17  9:31 AM  Result Value Ref Range   Ferritin 38 24 - 336 ng/mL  CEA     Status: Abnormal   Collection Time: 01/07/17  9:31 AM  Result Value Ref Range   CEA 8.9 (H) 0.0 - 4.7 ng/mL    Comment: (NOTE)       Roche ECLIA methodology       Nonsmokers  <3.9                                     Smokers     <5.6 Performed At: [facility] [place], [place] [id number] Lindon Romp MD [id number]   VAS [place] LOWER EXTREMITY ARTERIAL DUPLEX     Status: None (In process)   Collection Time: 02/13/17 11:31 AM  Result Value Ref Range   Left super femoral prox sys PSV 32 cm/s   Left popliteal prox sys PSV 14 cm/s   Left popliteal dist sys PSV -17 cm/s   Right super femoral prox sys PSV 16 cm/s   Right popliteal dist sys PSV -25 cm/s   Right peroneal sys PSV -24 cm/s   Left ant tibial distal sys 14 cm/s   left post tibial dist sys 8 cm/s   LEFT PERO DIST SYS 15.00 cm/s   RIGHT ANT DIST TIBAL SYS PSV 11 cm/s   RIGHT POST TIB  DIST SYS 15 cm/s    Radiology No results found.    Assessment/Plan Polycythemia, secondary Increases his clotting risk  Hypertension blood pressure control important in reducing the progression of atherosclerotic disease. On appropriate oral medications.   Tobacco use disorder We had a discussion for approximately 3-4 minutes regarding the absolute need for smoking cessation due to the deleterious nature of tobacco on the vascular system. We discussed the tobacco use would diminish patency of any intervention, and likely significantly worsen progressio of disease. We discussed multiple agents for quitting including replacement therapy or medications to reduce cravings such as Chantix. The patient voices their understanding of the importance of smoking cessation.  Atherosclerosis of native arteries of extremity with intermittent claudication (HCC) His ABIs today are 0.64 on the right 0.67 on the left.  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 His symptoms have actually improved with  increased exercise regimen.  We again strongly recommended smoking cessation and discussed options for quitting and the importance of quitting for several minutes.  No intervention will be planned currently.  Plan to recheck in 6 months.    Leotis Pain, MD  02/13/2017 12:41 PM    This note was created with Dragon medical transcription system.  Any errors from dictation are purely unintentional

## 2017-02-13 NOTE — Assessment & Plan Note (Signed)
His ABIs today are 0.64 on the right 0.67 on the left.  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 His symptoms have actually improved with increased exercise regimen.  We again strongly recommended smoking cessation and discussed options for quitting and the importance of quitting for several minutes.  No intervention will be planned currently.  Plan to recheck in 6 months.

## 2017-02-17 ENCOUNTER — Encounter: Payer: Self-pay | Admitting: *Deleted

## 2017-02-17 NOTE — Discharge Instructions (Signed)

## 2017-02-25 ENCOUNTER — Encounter
Admission: RE | Disposition: A | Discharge status: Home / Self Care | Payer: Self-pay | Source: Ambulatory Visit | Attending: Ophthalmology

## 2017-02-25 ENCOUNTER — Ambulatory Visit
Admission: RE | Admit: 2017-02-25 | Discharge: 2017-02-25 | Disposition: A | Discharge status: Home / Self Care | Payer: Medicare Other | Source: Ambulatory Visit | Attending: Ophthalmology | Admitting: Ophthalmology

## 2017-02-25 ENCOUNTER — Ambulatory Visit: Payer: Medicare Other | Admitting: Anesthesiology

## 2017-02-25 DIAGNOSIS — Z9221 Personal history of antineoplastic chemotherapy: Secondary | ICD-10-CM | POA: Diagnosis not present

## 2017-02-25 DIAGNOSIS — F17200 Nicotine dependence, unspecified, uncomplicated: Secondary | ICD-10-CM | POA: Diagnosis not present

## 2017-02-25 DIAGNOSIS — J439 Emphysema, unspecified: Secondary | ICD-10-CM | POA: Diagnosis not present

## 2017-02-25 DIAGNOSIS — Z85038 Personal history of other malignant neoplasm of large intestine: Secondary | ICD-10-CM | POA: Insufficient documentation

## 2017-02-25 DIAGNOSIS — H2512 Age-related nuclear cataract, left eye: Secondary | ICD-10-CM | POA: Insufficient documentation

## 2017-02-25 DIAGNOSIS — I10 Essential (primary) hypertension: Secondary | ICD-10-CM | POA: Diagnosis not present

## 2017-02-25 HISTORY — PX: CATARACT EXTRACTION W/PHACO: SHX586

## 2017-02-25 SURGERY — PHACOEMULSIFICATION, CATARACT, WITH IOL INSERTION
Anesthesia: Monitor Anesthesia Care | Laterality: Left | Wound class: Clean

## 2017-02-25 MED ORDER — CEFUROXIME OPHTHALMIC INJECTION 1 MG/0.1 ML
INJECTION | OPHTHALMIC | Status: DC | PRN
  Administered 2017-02-25: 0.1 mL via INTRACAMERAL

## 2017-02-25 MED ORDER — LIDOCAINE HCL (PF) 2 % IJ SOLN
INTRAOCULAR | Status: DC | PRN
  Administered 2017-02-25: 1 mL via INTRAMUSCULAR

## 2017-02-25 MED ORDER — ONDANSETRON HCL 4 MG/2ML IJ SOLN: 4.0000 mg | Freq: Once | INTRAMUSCULAR | Status: DC | PRN

## 2017-02-25 MED ORDER — ACETAMINOPHEN 160 MG/5ML PO SOLN: 325.0000 mg | ORAL | Status: DC | PRN

## 2017-02-25 MED ORDER — MOXIFLOXACIN HCL 0.5 % OP SOLN
1.0000 [drp] | OPHTHALMIC | Status: DC | PRN
  Administered 2017-02-25 (×3): 1 [drp] via OPHTHALMIC

## 2017-02-25 MED ORDER — FENTANYL CITRATE (PF) 100 MCG/2ML IJ SOLN
INTRAMUSCULAR | Status: DC | PRN
Start: 2017-02-25 — End: 2017-02-25
  Administered 2017-02-25: 100 ug via INTRAVENOUS

## 2017-02-25 MED ORDER — EPINEPHRINE PF 1 MG/ML IJ SOLN
INTRAOCULAR | Status: DC | PRN
  Administered 2017-02-25: 87 mL via OPHTHALMIC

## 2017-02-25 MED ORDER — NA HYALUR & NA CHOND-NA HYALUR 0.4-0.35 ML IO KIT
PACK | INTRAOCULAR | Status: DC | PRN
  Administered 2017-02-25: 1 mL via INTRAOCULAR

## 2017-02-25 MED ORDER — MIDAZOLAM HCL 2 MG/2ML IJ SOLN
INTRAMUSCULAR | Status: DC | PRN
  Administered 2017-02-25: 2 mg via INTRAVENOUS

## 2017-02-25 MED ORDER — ACETAMINOPHEN 325 MG PO TABS: 325.0000 mg | ORAL_TABLET | ORAL | Status: DC | PRN

## 2017-02-25 MED ORDER — LACTATED RINGERS IV SOLN: 10.0000 mL/h | INTRAVENOUS | Status: DC

## 2017-02-25 MED ORDER — BRIMONIDINE TARTRATE-TIMOLOL 0.2-0.5 % OP SOLN
OPHTHALMIC | Status: DC | PRN
  Administered 2017-02-25: 1 [drp] via OPHTHALMIC

## 2017-02-25 MED ORDER — ARMC OPHTHALMIC DILATING DROPS
1.0000 "application " | OPHTHALMIC | Status: DC | PRN
  Administered 2017-02-25 (×3): 1 via OPHTHALMIC

## 2017-02-25 SURGICAL SUPPLY — 25 items
CANNULA ANT/CHMB 27GA (MISCELLANEOUS) ×3 IMPLANT
CARTRIDGE ABBOTT (MISCELLANEOUS) IMPLANT
GLOVE SURG LX 7.5 STRW (GLOVE) ×2
GLOVE SURG LX STRL 7.5 STRW (GLOVE) ×1 IMPLANT
GLOVE SURG TRIUMPH 8.0 PF LTX (GLOVE) ×3 IMPLANT
GOWN STRL REUS W/ TWL LRG LVL3 (GOWN DISPOSABLE) ×2 IMPLANT
GOWN STRL REUS W/TWL LRG LVL3 (GOWN DISPOSABLE) ×4
LENS IOL TECNIS ITEC 20.0 (Intraocular Lens) ×3 IMPLANT
MARKER SKIN DUAL TIP RULER LAB (MISCELLANEOUS) ×3 IMPLANT
NDL RETROBULBAR .5 NSTRL (NEEDLE) IMPLANT
NEEDLE FILTER BLUNT 18X 1/2SAF (NEEDLE) ×2
NEEDLE FILTER BLUNT 18X1 1/2 (NEEDLE) ×1 IMPLANT
PACK CATARACT BRASINGTON (MISCELLANEOUS) ×3 IMPLANT
PACK EYE AFTER SURG (MISCELLANEOUS) ×3 IMPLANT
PACK OPTHALMIC (MISCELLANEOUS) ×3 IMPLANT
RING MALYGIN 7.0 (MISCELLANEOUS) IMPLANT
SUT ETHILON 10-0 CS-B-6CS-B-6 (SUTURE)
SUT VICRYL  9 0 (SUTURE)
SUT VICRYL 9 0 (SUTURE) IMPLANT
SUTURE EHLN 10-0 CS-B-6CS-B-6 (SUTURE) IMPLANT
SYR 3ML LL SCALE MARK (SYRINGE) ×3 IMPLANT
SYR 5ML LL (SYRINGE) ×3 IMPLANT
SYR TB 1ML LUER SLIP (SYRINGE) ×3 IMPLANT
WATER STERILE IRR 250ML POUR (IV SOLUTION) ×3 IMPLANT
WIPE NON LINTING 3.25X3.25 (MISCELLANEOUS) ×3 IMPLANT

## 2017-02-25 NOTE — Op Note (Signed)
OPERATIVE NOTE  Millard Bautch [id number] 02/25/2017   PREOPERATIVE DIAGNOSIS:  Nuclear sclerotic cataract left eye. H25.12   POSTOPERATIVE DIAGNOSIS:    Nuclear sclerotic cataract left eye.     PROCEDURE:  Phacoemusification with posterior chamber intraocular lens placement of the left eye   LENS:   Implant Name Type Inv. Item Serial No. Manufacturer Lot No. LRB No. Used  LENS IOL DIOP 20.0 - [id number] Intraocular Lens LENS IOL DIOP 20.0 [id number]  Left 1        ULTRASOUND TIME: 15  % of 1 minutes 40 seconds, CDE 14.7  SURGEON:  Wyonia Hough, MD   ANESTHESIA:  Topical with tetracaine drops and 2% Xylocaine jelly, augmented with 1% preservative-free intracameral lidocaine.    COMPLICATIONS:  None.   DESCRIPTION OF PROCEDURE:  The patient was identified in the holding room and transported to the operating room and placed in the supine position under the operating microscope.  The left eye was identified as the operative eye and it was prepped and draped in the usual sterile ophthalmic fashion.   A 1 millimeter clear-corneal paracentesis was made at the 1:30 position.  0.5 ml of preservative-free 1% lidocaine was injected into the anterior chamber.  The anterior chamber was filled with Viscoat viscoelastic.  A 2.4 millimeter keratome was used to make a near-clear corneal incision at the 10:30 position.  .  A curvilinear capsulorrhexis was made with a cystotome and capsulorrhexis forceps.  Balanced salt solution was used to hydrodissect and hydrodelineate the nucleus.   Phacoemulsification was then used in stop and chop fashion to remove the lens nucleus and epinucleus.  The remaining cortex was then removed using the irrigation and aspiration handpiece. Provisc was then placed into the capsular bag to distend it for lens placement.  A lens was then injected into the capsular bag.  The remaining viscoelastic was aspirated.   Wounds were hydrated with balanced salt  solution.  The anterior chamber was inflated to a physiologic pressure with balanced salt solution.  No wound leaks were noted. Cefuroxime 0.1 ml of a 10mg /ml solution was injected into the anterior chamber for a dose of 1 mg of intracameral antibiotic at the completion of the case.   Timolol and Brimonidine drops were applied to the eye.  The patient was taken to the recovery room in stable condition without complications of anesthesia or surgery.  Kodah Maret 02/25/2017, 8:46 AM

## 2017-02-25 NOTE — Anesthesia Preprocedure Evaluation (Signed)
Anesthesia Evaluation  Patient identified by MRN, date of birth, ID band Patient awake    Reviewed: Allergy & Precautions, H&P , NPO status , Patient's Chart, lab work & pertinent test results  Airway Mallampati: II  TM Distance: >3 FB Neck ROM: full    Dental  (+) Loose, Missing, Chipped, Poor Dentition   Pulmonary COPD, Current Smoker,    Pulmonary exam normal breath sounds clear to auscultation       Cardiovascular hypertension, Normal cardiovascular exam Rhythm:regular Rate:Normal     Neuro/Psych    GI/Hepatic   Endo/Other    Renal/GU      Musculoskeletal   Abdominal   Peds  Hematology   Anesthesia Other Findings   Reproductive/Obstetrics                             Anesthesia Physical  Anesthesia Plan  ASA: III  Anesthesia Plan: MAC   Post-op Pain Management:    Induction:   PONV Risk Score and Plan: 0 and Midazolam  Airway Management Planned:   Additional Equipment:   Intra-op Plan:   Post-operative Plan:   Informed Consent: I have reviewed the patients History and Physical, chart, labs and discussed the procedure including the risks, benefits and alternatives for the proposed anesthesia with the patient or authorized representative who has indicated his/her understanding and acceptance.     Plan Discussed with: CRNA  Anesthesia Plan Comments:         Anesthesia Quick Evaluation

## 2017-02-25 NOTE — Transfer of Care (Signed)
Immediate Anesthesia Transfer of Care Note  Patient: William Ford  Procedure(s) Performed: CATARACT EXTRACTION PHACO AND INTRAOCULAR LENS PLACEMENT (IOC) left (Left )  Patient Location: PACU  Anesthesia Type: MAC  Level of Consciousness: awake, alert  and patient cooperative  Airway and Oxygen Therapy: Patient Spontanous Breathing and Patient connected to supplemental oxygen  Post-op Assessment: Post-op Vital signs reviewed, Patient's Cardiovascular Status Stable, Respiratory Function Stable, Patent Airway and No signs of Nausea or vomiting  Post-op Vital Signs: Reviewed and stable  Complications: No apparent anesthesia complications

## 2017-02-25 NOTE — Anesthesia Postprocedure Evaluation (Signed)
Anesthesia Post Note  Patient: William Ford  Procedure(s) Performed: CATARACT EXTRACTION PHACO AND INTRAOCULAR LENS PLACEMENT (IOC) left (Left )  Patient location during evaluation: PACU Anesthesia Type: MAC Level of consciousness: awake and alert Pain management: pain level controlled Vital Signs Assessment: post-procedure vital signs reviewed and stable Respiratory status: spontaneous breathing, nonlabored ventilation, respiratory function stable and patient connected to nasal cannula oxygen Cardiovascular status: stable and blood pressure returned to baseline Postop Assessment: no apparent nausea or vomiting Anesthetic complications: no    Nafeesa Dils ELAINE

## 2017-02-25 NOTE — Anesthesia Procedure Notes (Signed)
Procedure Name: MAC Performed by: Alleigh Mollica, CRNA Pre-anesthesia Checklist: Patient identified, Emergency Drugs available, Suction available, Timeout performed and Patient being monitored Patient Re-evaluated:Patient Re-evaluated prior to induction Oxygen Delivery Method: Nasal cannula Placement Confirmation: positive ETCO2       

## 2017-02-25 NOTE — H&P (Signed)
The History and Physical notes are on paper, have been signed, and are to be scanned. The patient remains stable and unchanged from the H&P.   Previous H&P reviewed, patient examined, and there are no changes.  William Ford 02/25/2017 7:39 AM   

## 2017-02-27 ENCOUNTER — Encounter: Payer: Self-pay | Admitting: Ophthalmology

## 2017-03-04 ENCOUNTER — Inpatient Hospital Stay: Payer: Medicare Other | Attending: Hematology and Oncology

## 2017-03-04 ENCOUNTER — Inpatient Hospital Stay: Payer: Medicare Other

## 2017-03-04 DIAGNOSIS — I700 Atherosclerosis of aorta: Secondary | ICD-10-CM | POA: Diagnosis not present

## 2017-03-04 DIAGNOSIS — K8020 Calculus of gallbladder without cholecystitis without obstruction: Secondary | ICD-10-CM | POA: Insufficient documentation

## 2017-03-04 DIAGNOSIS — G629 Polyneuropathy, unspecified: Secondary | ICD-10-CM | POA: Insufficient documentation

## 2017-03-04 DIAGNOSIS — Z9049 Acquired absence of other specified parts of digestive tract: Secondary | ICD-10-CM | POA: Diagnosis not present

## 2017-03-04 DIAGNOSIS — K565 Intestinal adhesions [bands], unspecified as to partial versus complete obstruction: Secondary | ICD-10-CM | POA: Diagnosis not present

## 2017-03-04 DIAGNOSIS — C180 Malignant neoplasm of cecum: Secondary | ICD-10-CM | POA: Diagnosis not present

## 2017-03-04 DIAGNOSIS — Z9221 Personal history of antineoplastic chemotherapy: Secondary | ICD-10-CM | POA: Insufficient documentation

## 2017-03-04 DIAGNOSIS — N400 Enlarged prostate without lower urinary tract symptoms: Secondary | ICD-10-CM | POA: Diagnosis not present

## 2017-03-04 DIAGNOSIS — Z79899 Other long term (current) drug therapy: Secondary | ICD-10-CM | POA: Insufficient documentation

## 2017-03-04 DIAGNOSIS — I739 Peripheral vascular disease, unspecified: Secondary | ICD-10-CM | POA: Diagnosis not present

## 2017-03-04 DIAGNOSIS — R911 Solitary pulmonary nodule: Secondary | ICD-10-CM | POA: Diagnosis not present

## 2017-03-04 DIAGNOSIS — I10 Essential (primary) hypertension: Secondary | ICD-10-CM | POA: Insufficient documentation

## 2017-03-04 DIAGNOSIS — D751 Secondary polycythemia: Secondary | ICD-10-CM | POA: Diagnosis not present

## 2017-03-04 DIAGNOSIS — C786 Secondary malignant neoplasm of retroperitoneum and peritoneum: Secondary | ICD-10-CM | POA: Diagnosis not present

## 2017-03-04 DIAGNOSIS — F17210 Nicotine dependence, cigarettes, uncomplicated: Secondary | ICD-10-CM | POA: Diagnosis not present

## 2017-03-04 LAB — HEMATOCRIT: HCT: 51.7 % (ref 40.0–52.0)

## 2017-03-04 MED ORDER — SODIUM CHLORIDE 0.9% FLUSH
10.0000 mL | INTRAVENOUS | Status: DC | PRN
  Administered 2017-03-04: 10 mL via INTRAVENOUS
  Filled 2017-03-04: qty 10

## 2017-03-04 MED ORDER — HEPARIN SOD (PORK) LOCK FLUSH 100 UNIT/ML IV SOLN
500.0000 [IU] | Freq: Once | INTRAVENOUS | Status: AC
  Administered 2017-03-04: 500 [IU] via INTRAVENOUS

## 2017-04-14 ENCOUNTER — Inpatient Hospital Stay: Payer: Medicare Other

## 2017-04-14 ENCOUNTER — Ambulatory Visit: Payer: Medicare Other

## 2017-04-14 ENCOUNTER — Telehealth: Payer: Self-pay | Admitting: *Deleted

## 2017-04-14 ENCOUNTER — Inpatient Hospital Stay: Payer: Medicare Other | Attending: Nurse Practitioner | Admitting: Nurse Practitioner

## 2017-04-14 ENCOUNTER — Ambulatory Visit
Admission: RE | Admit: 2017-04-14 | Discharge: 2017-04-14 | Disposition: A | Discharge status: Home / Self Care | Payer: Medicare Other | Source: Ambulatory Visit | Attending: Nurse Practitioner | Admitting: Nurse Practitioner

## 2017-04-14 VITALS — BP 167/94 | HR 91 | Temp 96.3°F | Resp 18 | Wt 147.4 lb

## 2017-04-14 VITALS — BP 154/98 | HR 87 | Resp 18

## 2017-04-14 DIAGNOSIS — M25519 Pain in unspecified shoulder: Secondary | ICD-10-CM | POA: Insufficient documentation

## 2017-04-14 DIAGNOSIS — Z452 Encounter for adjustment and management of vascular access device: Secondary | ICD-10-CM | POA: Insufficient documentation

## 2017-04-14 DIAGNOSIS — M62838 Other muscle spasm: Secondary | ICD-10-CM | POA: Insufficient documentation

## 2017-04-14 DIAGNOSIS — D751 Secondary polycythemia: Secondary | ICD-10-CM

## 2017-04-14 DIAGNOSIS — M542 Cervicalgia: Secondary | ICD-10-CM | POA: Diagnosis not present

## 2017-04-14 DIAGNOSIS — Z85038 Personal history of other malignant neoplasm of large intestine: Secondary | ICD-10-CM | POA: Diagnosis present

## 2017-04-14 LAB — COMPREHENSIVE METABOLIC PANEL
ALBUMIN: 4.6 g/dL (ref 3.5–5.0)
ALT: 14 U/L — ABNORMAL LOW (ref 17–63)
ANION GAP: 9 (ref 5–15)
AST: 22 U/L (ref 15–41)
Alkaline Phosphatase: 95 U/L (ref 38–126)
BUN: 9 mg/dL (ref 6–20)
CO2: 27 mmol/L (ref 22–32)
Calcium: 9.5 mg/dL (ref 8.9–10.3)
Chloride: 100 mmol/L — ABNORMAL LOW (ref 101–111)
Creatinine, Ser: 1.06 mg/dL (ref 0.61–1.24)
GFR calc Af Amer: >60 mL/min (ref >60)
GFR calc non Af Amer: >60 mL/min (ref >60)
GLUCOSE: 110 mg/dL — AB (ref 65–99)
POTASSIUM: 4.2 mmol/L (ref 3.5–5.1)
SODIUM: 136 mmol/L (ref 135–145)
TOTAL PROTEIN: 8.6 g/dL — AB (ref 6.5–8.1)
Total Bilirubin: 1.1 mg/dL (ref 0.3–1.2)

## 2017-04-14 LAB — CBC WITH DIFFERENTIAL/PLATELET
BASOS ABS: 0.1 10*3/uL (ref 0–0.1)
BASOS PCT: 1 %
Eosinophils Absolute: 0.1 10*3/uL (ref 0–0.7)
Eosinophils Relative: 1 %
HEMATOCRIT: 60 % — AB (ref 40.0–52.0)
HEMOGLOBIN: 20.1 g/dL — AB (ref 13.0–18.0)
LYMPHS PCT: 27 %
Lymphs Abs: 2.3 10*3/uL (ref 1.0–3.6)
MCH: 33 pg (ref 26.0–34.0)
MCHC: 33.5 g/dL (ref 32.0–36.0)
MCV: 98.6 fL (ref 80.0–100.0)
MONOS PCT: 7 %
Monocytes Absolute: 0.6 10*3/uL (ref 0.2–1.0)
NEUTROS ABS: 5.5 10*3/uL (ref 1.4–6.5)
Neutrophils Relative %: 64 %
Platelets: 197 10*3/uL (ref 150–440)
RBC: 6.09 MIL/uL — ABNORMAL HIGH (ref 4.40–5.90)
RDW: 14.6 % — ABNORMAL HIGH (ref 11.5–14.5)
WBC: 8.6 10*3/uL (ref 3.8–10.6)

## 2017-04-14 LAB — MAGNESIUM: Magnesium: 2.1 mg/dL (ref 1.7–2.4)

## 2017-04-14 MED ORDER — CYCLOBENZAPRINE HCL 5 MG PO TABS: 5.0000 mg | ORAL_TABLET | Freq: Three times a day (TID) | ORAL | 0 refills | Status: DC | PRN

## 2017-04-14 NOTE — Telephone Encounter (Signed)
I think you sent this to the wrong person. Did you mean to send this to Dr Georgina Snell at [facility]?

## 2017-04-14 NOTE — Telephone Encounter (Signed)
No, but it is taken care of, My apologies

## 2017-04-14 NOTE — Telephone Encounter (Signed)
Patient called to report that he is having left neck and shoulder pain at port site down his arm for 2 days. Denies swelling of extremity. Reports he cannot sit or lie down due to the pain. He states he has had his port for over 2 years.  Appointment given to see NP at 60, labs added for 1045

## 2017-04-14 NOTE — Progress Notes (Signed)
Patient states he is having pain in his left shoulder, so much so that he cannot lie down in the bed.  He is having to sleep in his recliner.  Pain scale 9/10.  States pain started last week, got a little better and worsened yesterday.  States he is now hurting in your neck and radiates into shoulder and port area.

## 2017-04-14 NOTE — Progress Notes (Signed)
Symptom Management Consult note [facility]  Telephone:([phone] [phone]  Patient Care Team: Jodi Marble, MD as PCP - General (Internal Medicine)   Name of the patient: William Ford  [id number]  [birth year]   Date of visit: 04/14/17  Diagnosis- Stage IIB Colon Cancer and Polycythemia  Chief complaint/ Reason for visit- pain at port site  Heme/Onc history: Patient was recently evaluated by primary oncologist, Dr. Mike Gip, on 01/07/17.  He underwent right colectomy on 11/05/12.  Pathology revealed a T3 N0 M0 tumor cecum with 17- lymph nodes.  Circumferential margin was positive.  There was lymphovascular invasion.  He had 1 site of metastases in peritoneum. Mismatch repair was positive. 12 cycles of FOLFOX chemo 04/02/12-08/19/13. He suffered adhesions of small bowel 3/15. 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 Last colonoscopy was 2017. CT C/A/P 07/03/14 no evidence of metastatic disease. New irregular pulmonary nodule RUL most likely infectious/inflammatory. Stable multinodular adrenal glands. 07/03/16- slight enlargement of RUL pulm nodule 91m--> 4.673m 01/14/17 CT Chest- decrease in RUL nodule from 85m60mo 2mm285m Secondary PCV d/t smoking. Work-up on 07/25/2015 revealed a hematocrit of 57.2, hemoglobin 19.2, MCV 95.1, platelets 169,000, WBC 8300 with an ANC of 4900.  Erythropoietin level was 6.0 (2.6-18.5).  Testosterone level was 680.  JAK2 was negative for V617F and exon 12.  Ferritin was 90, iron saturation 24% , and TIBC 372.  He undergoes small volume phlebotomies (250 cc) to maintain a hematocrit < 52 and a hemoglobin < 18.  With an elevated hematocrit, he feels sluggish/drowsy that resolves after phlebotomy. Not able to tolerate 500cc phlebotomy d/t fatigue. Last phlebotomy was on 06/25/2016.   Interval history-patient presents to symptom management clinic for complaints of neck pain left side that started approximately 1 week ago  gradually worsened.  Pain radiates to his port site.  He describes the pain as "sore" and "throbbing".  He rates his pain a 6 of 10 and states that it interferes with his sleep and daily living.  He has tried using topicals such as icy hot which is not improved his pain.  He has a history of PAD and smokes.  History of bilateral superficial femoral artery occlusions managed by Dr. Dew.Lucky Cowboy known trauma.    ECOG FS:1 - Symptomatic but completely ambulatory  Review of systems- Review of Systems  Constitutional: Negative for chills, diaphoresis, fever, malaise/fatigue and weight loss.  HENT: Negative.   Eyes: Negative.   Respiratory: Negative.   Cardiovascular: Positive for claudication. Negative for chest pain, palpitations, orthopnea and leg swelling.  Gastrointestinal: Negative.   Genitourinary: Negative.   Musculoskeletal: Positive for neck pain.  Skin: Negative for itching and rash.  Neurological: Negative.  Negative for weakness.  Endo/Heme/Allergies: Negative.   Psychiatric/Behavioral: Negative.     Current treatment- surveillance  No Known Allergies   Past Medical History:  Diagnosis Date  . Arthritis    hands  . Colon cancer (HCC)[place]. Emphysema of lung (HCC)    mild (per pt)  . Hypertension   . Numbness    toes and fingers (S/P chemo - per pt)    Past Surgical History:  Procedure Laterality Date  . AMPUTATION Right 2003   4th   . CATARACT EXTRACTION W/PHACO Right 02/02/2017   Procedure: CAT[place]ACT EXTRACTION PHACO AND [place]TRAOCULAR LENS PLACEMENT [place];  Surgeon: Bras[place]andrew Koyanagi;  [place];  Laterality: Right;  . CATARACT EXTRACTION  W/PHACO Left 02/25/2017   Procedure: CATARACT EXTRACTION PHACO AND INTRAOCULAR LENS PLACEMENT ([facility]) left;  Surgeon: Leandrew Koyanagi, MD;  Location: [place];  Service: Ophthalmology;  Laterality: Left;  . COLON SURGERY  2013    Social History   Socioeconomic  History  . Marital status: Single    Spouse name: Not on file  . Number of children: Not on file  . Years of education: Not on file  . Highest education level: Not on file  Social Needs  . Financial resource strain: Not on file  . Food insecurity - worry: Not on file  . Food insecurity - inability: Not on file  . Transportation needs - medical: Not on file  . Transportation needs - non-medical: Not on file  Occupational History  . Not on file  Tobacco Use  . Smoking status: Current Every Day Smoker    Packs/day: 0.50    Years: 45.00    Pack years: 22.50    Types: Cigarettes  . Smokeless tobacco: Never Used  . Tobacco comment: since age 67  Substance and Sexual Activity  . Alcohol use: Yes    Alcohol/week: 1.2 oz    Types: 2 Cans of beer per week  . Drug use: No  . Sexual activity: Not on file  Other Topics Concern  . Not on file  Social History Narrative  . Not on file    Family History  Problem Relation Age of Onset  . Cancer Maternal Grandfather     Current Outpatient Medications:  .  Carboxymethylcellul-Glycerin (REFRESH OPTIVE OP), Apply to eye at bedtime., Disp: , Rfl:  .  CELEBREX 200 MG capsule, Take 200 mg by mouth daily. , Disp: , Rfl: 0 .  gabapentin (NEURONTIN) 300 MG capsule, Take 1 capsule by mouth daily. , Disp: , Rfl: 0 .  L-Methylfolate-Algae-B12-B6 (FOLTANX RF) 3-90.314-2-35 MG CAPS, , Disp: , Rfl: 0 .  lisinopril (PRINIVIL,ZESTRIL) 20 MG tablet, Take 20 mg by mouth daily., Disp: , Rfl: 0 .  sildenafil (REVATIO) 20 MG tablet, Take 2 tablets by mouth as needed., Disp: , Rfl: 0 .  cyclobenzaprine (FLEXERIL) 5 MG tablet, Take 1 tablet (5 mg total) by mouth 3 (three) times daily as needed for muscle spasms., Disp: 30 tablet, Rfl: 0 .  LYRICA 75 MG capsule, take 1 capsule by mouth twice a day (Patient not taking: Reported on 02/13/2017), Disp: 60 capsule, Rfl: 3 No current facility-administered medications for this visit.   Facility-Administered  Medications Ordered in Other Visits:  .  sodium chloride 0.9 % injection 10 mL, 10 mL, Intravenous, PRN, Mike Gip, Melissa C, MD, 10 mL at 12/13/14 1043 .  sodium chloride flush (NS) 0.9 % injection 10 mL, 10 mL, Intravenous, PRN, Mike Gip, Melissa C, MD, 10 mL at 08/20/16 1050  Physical exam:  Vitals:   04/14/17 1203  BP: (!) 167/94  Pulse: 91  Resp: 18  Temp: (!) 96.3 F (35.7 C)  TempSrc: Tympanic  Weight: 147 lb 7 oz (66.9 kg)   Physical Exam   CMP Latest Ref Rng & Units 04/14/2017  Glucose 65 - 99 mg/dL 110(H)  BUN 6 - 20 mg/dL 9  Creatinine 0.61 - 1.24 mg/dL 1.06  Sodium 135 - 145 mmol/L 136  Potassium 3.5 - 5.1 mmol/L 4.2  Chloride 101 - 111 mmol/L 100(L)  CO2 22 - 32 mmol/L 27  Calcium 8.9 - 10.3 mg/dL 9.5  Total Protein 6.5 - 8.1 g/dL 8.6(H)  Total Bilirubin 0.3 - 1.2 mg/dL  1.1  Alkaline Phos 38 - 126 U/L 95  AST 15 - 41 U/L 22  ALT 17 - 63 U/L 14(L)   CBC Latest Ref Rng & Units 04/14/2017  WBC 3.8 - 10.6 K/uL 8.6  Hemoglobin 13.0 - 18.0 g/dL 20.1(H)  Hematocrit 40.0 - 52.0 % 60.0(H)  Platelets 150 - 440 K/uL 197   No images are attached to the encounter.  US Venous Img Upper Uni Left  Result Date: 04/14/2017 CLINICAL DATA:  Neck pain. EXAM: Left UPPER EXTREMITY VENOUS DOPPLER ULTRASOUND TECHNIQUE: Gray-scale sonography with graded compression, as well as color Doppler and duplex ultrasound were performed to evaluate the upper extremity deep venous system from the level of the subclavian vein and including the jugular, axillary, basilic, radial, ulnar and upper cephalic vein. Spectral Doppler was utilized to evaluate flow at rest and with distal augmentation maneuvers. COMPARISON:  None. FINDINGS: Contralateral Subclavian Vein: Respiratory phasicity is normal and symmetric with the symptomatic side. No evidence of thrombus. Normal compressibility. Internal Jugular Vein: No evidence of thrombus. Normal compressibility, respiratory phasicity and response to  augmentation. Subclavian Vein: No evidence of thrombus. Normal compressibility, respiratory phasicity and response to augmentation. Axillary Vein: No evidence of thrombus. Normal compressibility, respiratory phasicity and response to augmentation. Cephalic Vein: No evidence of thrombus. Normal compressibility, respiratory phasicity and response to augmentation. Basilic Vein: No evidence of thrombus. Normal compressibility, respiratory phasicity and response to augmentation. Brachial Veins: No evidence of thrombus. Normal compressibility, respiratory phasicity and response to augmentation. Radial Veins: No evidence of thrombus. Normal compressibility, respiratory phasicity and response to augmentation. Ulnar Veins: No evidence of thrombus. Normal compressibility, respiratory phasicity and response to augmentation. Venous Reflux:  None visualized. Other Findings: No definite fluid collection is noted around Port-A-Cath site. IMPRESSION: No evidence of DVT within the left upper extremity. Electronically Signed   By: Marijo Conception, M.D.   On: 04/14/2017 14:06     Assessment and plan- Patient is a 67 y.o. male with history of stage IIb colon cancer currently on surveillance who presents to symptom management clinic for complaints of neck pain. 1.  Neck pain-left side radiating to port site.  Given history concern for clot.  Will get stat venous ultrasound of neck/LUE.  If ultrasound is negative, symptoms consistent with muscle spasm.  Neck muscles feel tight and inflamed upon palpation.  Would recommend range of motion exercises, physical therapy exercises, and Flexeril.   2. Secondary PCV- Hmt 60.0, Hmg 20.1- Will do phlebotomy today 250cc and repeat in 1 week.   Stat venous u/s LUE. Phlebotomy today. 1 week phlebotomy. 2 weeks labs/re-evaluation/poss phlebotomy   Visit Diagnosis 1. Muscle spasm   2. Polycythemia, secondary     Patient expressed understanding and was in agreement with this plan. He  also understands that He can call clinic at any time with any questions, concerns, or complaints. Patient advised to notify the clinic if there is no improvement in symptoms or if symptoms worsen in next 3-4 days.   A total of (30) minutes of face-to-face time was spent with this patient with greater than 50% of that time in counseling and care-coordination.  Beckey Rutter, DNP, AGNP-C [place] at [facility] [phone] (office) 04/14/17 11:08 AM

## 2017-04-15 ENCOUNTER — Encounter: Payer: Self-pay | Admitting: Nurse Practitioner

## 2017-04-15 LAB — TROPONIN T

## 2017-04-21 ENCOUNTER — Inpatient Hospital Stay: Payer: Medicare Other

## 2017-04-21 VITALS — BP 118/75 | HR 105 | Temp 97.0°F | Resp 18

## 2017-04-21 DIAGNOSIS — D751 Secondary polycythemia: Secondary | ICD-10-CM

## 2017-04-21 DIAGNOSIS — M542 Cervicalgia: Secondary | ICD-10-CM | POA: Diagnosis not present

## 2017-04-28 ENCOUNTER — Other Ambulatory Visit: Payer: Self-pay | Admitting: *Deleted

## 2017-04-28 DIAGNOSIS — D751 Secondary polycythemia: Secondary | ICD-10-CM

## 2017-04-29 ENCOUNTER — Inpatient Hospital Stay: Payer: Medicare Other

## 2017-04-29 ENCOUNTER — Inpatient Hospital Stay (HOSPITAL_BASED_OUTPATIENT_CLINIC_OR_DEPARTMENT_OTHER): Payer: Medicare Other | Admitting: Hematology and Oncology

## 2017-04-29 ENCOUNTER — Encounter: Payer: Self-pay | Admitting: Hematology and Oncology

## 2017-04-29 VITALS — BP 132/82 | HR 106 | Temp 96.6°F | Resp 20 | Wt 145.7 lb

## 2017-04-29 DIAGNOSIS — D751 Secondary polycythemia: Secondary | ICD-10-CM | POA: Diagnosis not present

## 2017-04-29 DIAGNOSIS — C182 Malignant neoplasm of ascending colon: Secondary | ICD-10-CM

## 2017-04-29 DIAGNOSIS — Z85038 Personal history of other malignant neoplasm of large intestine: Secondary | ICD-10-CM | POA: Diagnosis not present

## 2017-04-29 DIAGNOSIS — M25519 Pain in unspecified shoulder: Secondary | ICD-10-CM

## 2017-04-29 DIAGNOSIS — R911 Solitary pulmonary nodule: Secondary | ICD-10-CM

## 2017-04-29 DIAGNOSIS — M542 Cervicalgia: Secondary | ICD-10-CM | POA: Diagnosis not present

## 2017-04-29 LAB — COMPREHENSIVE METABOLIC PANEL
ALT: 15 U/L — ABNORMAL LOW (ref 17–63)
AST: 31 U/L (ref 15–41)
Albumin: 4 g/dL (ref 3.5–5.0)
Alkaline Phosphatase: 96 U/L (ref 38–126)
Anion gap: 8 (ref 5–15)
BUN: 10 mg/dL (ref 6–20)
CO2: 25 mmol/L (ref 22–32)
Calcium: 9 mg/dL (ref 8.9–10.3)
Chloride: 100 mmol/L — ABNORMAL LOW (ref 101–111)
Creatinine, Ser: 1.16 mg/dL (ref 0.61–1.24)
GFR calc Af Amer: >60 mL/min (ref >60)
GFR calc non Af Amer: >60 mL/min (ref >60)
Glucose, Bld: 174 mg/dL — ABNORMAL HIGH (ref 65–99)
Potassium: 3.5 mmol/L (ref 3.5–5.1)
Sodium: 133 mmol/L — ABNORMAL LOW (ref 135–145)
Total Bilirubin: 0.9 mg/dL (ref 0.3–1.2)
Total Protein: 7.9 g/dL (ref 6.5–8.1)

## 2017-04-29 LAB — CBC WITH DIFFERENTIAL/PLATELET
BASOS ABS: 0.1 10*3/uL (ref 0–0.1)
Basophils Relative: 1 %
EOS PCT: 1 %
Eosinophils Absolute: 0.1 10*3/uL (ref 0–0.7)
HCT: 49.2 % (ref 40.0–52.0)
HEMOGLOBIN: 16.9 g/dL (ref 13.0–18.0)
LYMPHS PCT: 20 %
Lymphs Abs: 1.8 10*3/uL (ref 1.0–3.6)
MCH: 33 pg (ref 26.0–34.0)
MCHC: 34.3 g/dL (ref 32.0–36.0)
MCV: 96.2 fL (ref 80.0–100.0)
MONOS PCT: 5 %
Monocytes Absolute: 0.5 10*3/uL (ref 0.2–1.0)
Neutro Abs: 6.7 10*3/uL — ABNORMAL HIGH (ref 1.4–6.5)
Neutrophils Relative %: 73 %
Platelets: 152 10*3/uL (ref 150–440)
RBC: 5.11 MIL/uL (ref 4.40–5.90)
RDW: 13.8 % (ref 11.5–14.5)
Smear Review: ADEQUATE
WBC: 9.2 10*3/uL (ref 3.8–10.6)

## 2017-04-29 LAB — FERRITIN: Ferritin: 71 ng/mL (ref 24–336)

## 2017-04-29 MED ORDER — HEPARIN SOD (PORK) LOCK FLUSH 100 UNIT/ML IV SOLN
500.0000 [IU] | Freq: Once | INTRAVENOUS | Status: AC
  Administered 2017-04-29: 500 [IU] via INTRAVENOUS
  Filled 2017-04-29: qty 5

## 2017-04-29 MED ORDER — SODIUM CHLORIDE 0.9% FLUSH
10.0000 mL | INTRAVENOUS | Status: DC | PRN
  Administered 2017-04-29: 10 mL via INTRAVENOUS
  Filled 2017-04-29: qty 10

## 2017-04-29 NOTE — Progress Notes (Signed)
[place] [facility] day:  04/29/2017   Chief Complaint: William Ford is a 67 y.o. male with stage IIB colon cancer and polycythemia who is seen for 4 month assessment.  HPI: The patient was last seen in the medical oncology clinic on 01/07/2017.  At that time, he was doing "so so". He had numbness in his feet.  He was trying to stop smoking.  Exam was unremarkable. Hematocrit was 51.7. CEA was 8.9 (stabele with negative imaging).  Chest CT on 01/14/2017 revealed an interval decrease in medial right upper lobe pulmonary nodule from 5 mm on prior study to 2 mm on the current exam.  There was a 1-2 mm right lower lobe pulmonary nodule is stable since 10/20/2014 consistent with benign process.  He has emphysema and a 3.9 cm water density subcutaneous lesion left anterior lower chest wall, stable and ootential sebaceous cyst.  CBC on 04/21/2017 revealed a hematocrit of 60 and hemoglobin of 20.1.  He underwent phlebotomy.  Symptomatically, patient is doing well. He denies acute complaints. He continues to complain of pain in his shoulder. Patient previous seen in the symptom management clinic and put on Cyclobenzaprine. He notes that this intervention has been effective in helping with his pain.  Patient continues to smoke daily. Patient denies bleeding; no hematochezia, melena, or gross hematuria. Patient denies B symptoms or interval infections. Patient is eating well. He denies any abdominal symptoms.  His weight is down 5 pounds since his last visit in 01/2017.   Past Medical History:  Diagnosis Date  . Arthritis    hands  . Colon cancer ([place])   . Emphysema of lung (HCC)    mild (per pt)  . Hypertension   . Numbness    toes and fingers (S/P chemo - per pt)    Past Surgical History:  Procedure Laterality Date  . AMPUTATION Right 2003   4th   . CATARACT EXTRACTION W/PHACO Right 02/02/2017   Procedure: CATARACT EXTRACTION PHACO AND INTRAOCULAR LENS  PLACEMENT ([place]) RIGHT;  Surgeon: Leandrew Koyanagi, MD;  Location: [place];  Service: Ophthalmology;  Laterality: Right;  . CATARACT EXTRACTION W/PHACO Left 02/25/2017   Procedure: CATARACT EXTRACTION PHACO AND INTRAOCULAR LENS PLACEMENT ([place]) left;  Surgeon: Leandrew Koyanagi, MD;  Location: [place];  Service: Ophthalmology;  Laterality: Left;  . COLON SURGERY  2013    Family History  Problem Relation Age of Onset  . Cancer Maternal Grandfather     Social History:  reports that he has been smoking cigarettes.  He has a 22.50 pack-year smoking history. he has never used smokeless tobacco. He reports that he drinks about 1.2 oz of alcohol per week. He reports that he does not use drugs.  He started smoking at age 63.  He has stopped smoking several times.  He was smoking 1pack/week.  He has cut back over the past month.  He lives in [place].  The patient is "little sweet thing" (girlfriend since December) today.  Allergies: No Known Allergies  Current Medications: Current Outpatient Medications  Medication Sig Dispense Refill  . Carboxymethylcellul-Glycerin (REFRESH OPTIVE OP) Apply to eye at bedtime.    . CELEBREX 200 MG capsule Take 200 mg by mouth daily.   0  . cyclobenzaprine (FLEXERIL) 5 MG tablet Take 1 tablet (5 mg total) by mouth 3 (three) times daily as needed for muscle spasms. 30 tablet 0  . gabapentin (NEURONTIN) 300 MG capsule Take 1 capsule by mouth  daily.   0  . L-Methylfolate-Algae-B12-B6 (FOLTANX RF) 3-90.314-2-35 MG CAPS   0  . lisinopril (PRINIVIL,ZESTRIL) 20 MG tablet Take 20 mg by mouth daily.  0  . LYRICA 75 MG capsule take 1 capsule by mouth twice a day 60 capsule 3  . sildenafil (REVATIO) 20 MG tablet Take 2 tablets by mouth as needed.  0   No current facility-administered medications for this visit.    Facility-Administered Medications Ordered in Other Visits  Medication Dose Route Frequency Provider Last Rate Last Dose  .  heparin lock flush 100 unit/mL  500 Units Intravenous Once Corcoran, Melissa C, MD      . sodium chloride 0.9 % injection 10 mL  10 mL Intravenous PRN Nolon Stalls C, MD   10 mL at 12/13/14 1043  . sodium chloride flush (NS) 0.9 % injection 10 mL  10 mL Intravenous PRN Nolon Stalls C, MD   10 mL at 08/20/16 1050  . sodium chloride flush (NS) 0.9 % injection 10 mL  10 mL Intravenous PRN Lequita Asal, MD   10 mL at 04/29/17 7628    Review of Systems:  GENERAL:  Feels "alright".  No fevers or sweats.  Weight down 5 pounds.  PERFORMANCE STATUS (ECOG):  0 HEENT:  No visual changes, runny nose, sore throat, mouth sores or tenderness. Lungs: No shortness of breath or cough.  No hemoptysis. Cardiac:  No chest pain, palpitations, orthopnea, or PND. GI:  No nausea, vomiting, diarrhea, constipation, melena or hematochezia. GU:  No urgency, frequency, dysuria, or hematuria. Musculoskeletal:  No back pain.  Shoulder pain (see HPI).  No muscle tenderness. Extremities:  No pain or swelling. Skin:  No rashes or skin changes. Neuro:  Oxaliplatin neuropathy (stable).  No headache, weakness, balance or coordination issues. Endocrine:  No diabetes, thyroid issues, hot flashes or night sweats. Psych:  No mood changes, depression or anxiety. Pain:  1/10 - shoulder Review of systems:  All other systems reviewed and found to be negative.  Physical Exam: Blood pressure 132/82, pulse (!) 106, temperature (!) 96.6 F (35.9 C), temperature source Tympanic, resp. rate 20, weight 145 lb 11.6 oz (66.1 kg). GENERAL:  Thin gentleman sitting comfortably in the exam room in no acute distress. MENTAL STATUS:  Alert and oriented to person, place and time. HEAD:  Wearing a black cap.  Alopecia.  [name].  Normocephalic, atraumatic, face symmetric, no Cushingoid features. EYES:  Glasses.  Brown eyes.  Pupils equal round and reactive to light and accomodation.  No conjunctivitis or scleral icterus. ENT:   Oropharynx clear without lesion.  Tongue normal. Mucous membranes moist.  RESPIRATORY:  Clear to auscultation without rales, wheezes or rhonchi. CARDIOVASCULAR:  Regular rate and rhythm without murmur, rub or gallop. ABDOMEN:  Soft, non-tender, with active bowel sounds, and no hepatosplenomegaly.  No masses. SKIN:  Left facial cyst.  No rashes, ulcers or lesions. EXTREMITIES: Clubbing.  No edema, no skin discoloration or tenderness.  No palpable cords. LYMPH NODES: No palpable cervical, supraclavicular, axillary or inguinal adenopathy  NEUROLOGICAL: Unremarkable. PSYCH:  Appropriate.   Infusion on 04/29/2017  Component Date Value Ref Range Status  . Sodium 04/29/2017 133* 135 - 145 mmol/L Final  . Potassium 04/29/2017 3.5  3.5 - 5.1 mmol/L Final  . Chloride 04/29/2017 100* 101 - 111 mmol/L Final  . CO2 04/29/2017 25  22 - 32 mmol/L Final  . Glucose, Bld 04/29/2017 174* 65 - 99 mg/dL Final  . BUN 04/29/2017 10  6 - 20 mg/dL Final  . Creatinine, Ser 04/29/2017 1.16  0.61 - 1.24 mg/dL Final  . Calcium 04/29/2017 9.0  8.9 - 10.3 mg/dL Final  . Total Protein 04/29/2017 7.9  6.5 - 8.1 g/dL Final  . Albumin 04/29/2017 4.0  3.5 - 5.0 g/dL Final  . AST 04/29/2017 31  15 - 41 U/L Final  . ALT 04/29/2017 15* 17 - 63 U/L Final  . Alkaline Phosphatase 04/29/2017 96  38 - 126 U/L Final  . Total Bilirubin 04/29/2017 0.9  0.3 - 1.2 mg/dL Final  . GFR calc non Af Amer 04/29/2017 >60  >60 mL/min Final  . GFR calc Af Amer 04/29/2017 >60  >60 mL/min Final   Comment: (NOTE) The eGFR has been calculated using the CKD EPI equation. This calculation has not been validated in all clinical situations. eGFR's persistently <60 mL/min signify possible Chronic Kidney Disease.   Georgiann Hahn gap 04/29/2017 8  5 - 15 Final   Performed at [facility], [address], [place], [place] [address]  . WBC 04/29/2017 9.2  3.8 - 10.6 K/uL Final  . RBC 04/29/2017 5.11  4.40 - 5.90 MIL/uL Final  . Hemoglobin  04/29/2017 16.9  13.0 - 18.0 g/dL Final  . HCT 04/29/2017 49.2  40.0 - 52.0 % Final  . MCV 04/29/2017 96.2  80.0 - 100.0 fL Final  . MCH 04/29/2017 33.0  26.0 - 34.0 pg Final  . MCHC 04/29/2017 34.3  32.0 - 36.0 g/dL Final  . RDW 04/29/2017 13.8  11.5 - 14.5 % Final  . Platelets 04/29/2017 152  150 - 440 K/uL Final  . Neutrophils Relative % 04/29/2017 73  % Final  . Lymphocytes Relative 04/29/2017 20  % Final  . Monocytes Relative 04/29/2017 5  % Final  . Eosinophils Relative 04/29/2017 1  % Final  . Basophils Relative 04/29/2017 1  % Final  . Neutro Abs 04/29/2017 6.7* 1.4 - 6.5 K/uL Final  . Lymphs Abs 04/29/2017 1.8  1.0 - 3.6 K/uL Final  . Monocytes Absolute 04/29/2017 0.5  0.2 - 1.0 K/uL Final  . Eosinophils Absolute 04/29/2017 0.1  0 - 0.7 K/uL Final  . Basophils Absolute 04/29/2017 0.1  0 - 0.1 K/uL Final  . Smear Review 04/29/2017 PLATELET CLUMPS NOTED ON SMEAR, COUNT APPEARS ADEQUATE   Final   Performed at [facility], [address] [place], [place] [address]    Assessment:  William Ford is a 67 y.o. male with stage IIB colon cancer.  He underwent right colectomy on 11/05/2012.  Pathology revealed a T3N0M0 tumor in the cecum with 17 negative lymph nodes.  Circumferential margin was positive.  There was lymphovascular invasion. He had 1 deposit of metastases in peritoneum.  Mismatch repair was positive.  He received 12 cycles of FOLFOX chemotherapy from 12/31/2012 - 08/19/2013.  Chemotherapy was interrupted because of small bowel obstruction due to adhesions in 05/2013.  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  He has had an elevated CEA with negative imaging studies.  CEA was 10.7 on 12/31/2012, 13.4 on 06/17/2013, 11.7 on 07/18/2013, 8.3 on 09/28/2013, 8.0 on 12/30/2013, 9.6 on 05/17/2014, 9.8 on 10/25/2014, 10.3 on 05/09/2015, 10.4 on 07/25/2015, 8.4 on 08/08/2015, 8.8 on 11/07/2015, 8.3 on 02/06/2016, 18.8 on 05/28/2016, 8.8 on 08/20/2016,  and 8.9 on 01/07/2017.    Last colonoscopy was in 2017 (unknown provider).   Chest, abdomen, and pelvic CT scan on 07/03/2014 revealed no  evidence of metastatic disease.  There was a new irregular shaped pleural based pulmonary nodule in the periphery of the RUL most likely infectious/inflammatory. 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    Chest CT on 10/20/2014 revealed a stable small pleural-based area of nodularity in the right upper lobe (favor benign area of scarring). There was mild diffuse bronchial wall thickening with moderate centrilobular and mild paraseptal emphysema; imaging findings suggestive of underlying COPD.  Chest CT on 07/19/2015 revealed no acute cardiopulmonary abnormalities.  He has a peripheral scar like density within the right upper lobe.   Chest, abdomen, and pelvic CT on 07/03/2016 revealed slight enlargement of the right upper lobe subpleural pulmonary nodule (3 mm to 4.6 mm).  There was persistent irregularity of the gallbladder wall. There was a stable 12 mm right adrenal nodule.  There was mild enlargement of the prostate gland. 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  He is followed by vascular surgery.  PSA was 0.71 on 07/23/2016.  Chest CT on 01/14/2017 revealed an interval decrease in medial right upper lobe pulmonary nodule from 5 mm on prior study to 2 mm on the current exam.  There was a 1-2 mm right lower lobe pulmonary nodule is stable since 10/20/2014 consistent with benign process.   He has secondary polycythemia secondary to smoking. Work-up on 07/25/2015 revealed a hematocrit of 57.2, hemoglobin 19.2, MCV 95.1, platelets 169,000, WBC 8300 with an ANC of 4900.  Erythropoietin level was 6.0 (2.6-18.5).  Testosterone level was 680.  JAK2 was negative for V617F and exon 12.  Ferritin was 90, iron saturation 24% , and TIBC 372.    He  undergoes small volume phlebotomies (250 cc) to maintain a hematocrit < 52 and a hemoglobin < 18.  With an elevated hematocrit, he feels sluggish/drowsy.  He feels better 2 days after his phlebotomies.  He underwent phlebotomy (500 cc) on 07/25/2015 with subsequent fatigue.  Last phlebotomy was on 04/21/2017.  Symptomatically, he continues to have shoulder pain. He is taking the prescribed muscle relaxers. He continues to smoke.  Exam is unremarkable. Hematocrit is 49.2. CEA and ferritin are pending.  Plan: 1.  Labs today: CBC with diff, CMP, CEA, ferritin. 2.  Review interval chest CT- decreasing small pulmonary nodules, benign. 3.  Continue port flushes every 6-8 weeks. Discuss port removal following scheduled interval imaging.  4.  Hematocrit is 49.2. Goal is < 52. Patient does not require a small volume phlebotomy today. 5.  Schedule chest, abdomen, pelvic CT with contrast yearly (due 07/03/2017). 6.  Encourage smoking cessation.  If he stops smoking permanently, he will likely not require future phlebotomies. 7.  RTC every other month for labs (hematocrit) and +/- therapeutic phlebotomy 8.  RTC after CT scans for MD assessment, labs (CBC with diff, CMP, CEA) +/- phlebotomy.   Honor Loh, NP  04/29/2017, 9:53 AM   I saw and evaluated the patient, participating in the key portions of the service and reviewing pertinent diagnostic studies and records.  I reviewed the nurse practitioner's note and agree with the findings and the plan.  The assessment and plan were discussed with the patient.  Additional diagnostic studies of CT scans are needed for yearly restaging purposes and would change the clinical management.  A few questions were asked by the patient and answered.   Nolon Stalls, MD 04/29/2017,9:53 AM

## 2017-04-29 NOTE — Progress Notes (Signed)
Patient continues to have pain (muscle spasms) in his left neck and shoulder.  States it is much better than it was.  Was seen in Symptom Management Clinic a couple of weeks ago and prescribed Flexeril.

## 2017-04-30 LAB — CEA: CEA: 8.7 ng/mL — ABNORMAL HIGH (ref 0.0–4.7)

## 2017-06-24 ENCOUNTER — Inpatient Hospital Stay: Payer: Medicare Other

## 2017-06-24 ENCOUNTER — Inpatient Hospital Stay: Payer: Medicare Other | Attending: Hematology and Oncology

## 2017-06-24 DIAGNOSIS — C182 Malignant neoplasm of ascending colon: Secondary | ICD-10-CM | POA: Insufficient documentation

## 2017-06-24 DIAGNOSIS — D751 Secondary polycythemia: Secondary | ICD-10-CM | POA: Diagnosis present

## 2017-06-24 DIAGNOSIS — Z452 Encounter for adjustment and management of vascular access device: Secondary | ICD-10-CM | POA: Diagnosis not present

## 2017-06-24 LAB — COMPREHENSIVE METABOLIC PANEL
ALT: 11 U/L — ABNORMAL LOW (ref 17–63)
AST: 27 U/L (ref 15–41)
Albumin: 4 g/dL (ref 3.5–5.0)
Alkaline Phosphatase: 93 U/L (ref 38–126)
Anion gap: 8 (ref 5–15)
BUN: 9 mg/dL (ref 6–20)
CO2: 24 mmol/L (ref 22–32)
Calcium: 8.9 mg/dL (ref 8.9–10.3)
Chloride: 101 mmol/L (ref 101–111)
Creatinine, Ser: 1.06 mg/dL (ref 0.61–1.24)
GFR calc Af Amer: >60 mL/min (ref >60)
GFR calc non Af Amer: >60 mL/min (ref >60)
Glucose, Bld: 162 mg/dL — ABNORMAL HIGH (ref 65–99)
Potassium: 3.4 mmol/L — ABNORMAL LOW (ref 3.5–5.1)
Sodium: 133 mmol/L — ABNORMAL LOW (ref 135–145)
Total Bilirubin: 0.6 mg/dL (ref 0.3–1.2)
Total Protein: 7.8 g/dL (ref 6.5–8.1)

## 2017-06-24 LAB — CBC WITH DIFFERENTIAL/PLATELET
Band Neutrophils: 0 %
Basophils Absolute: 0.1 10*3/uL (ref 0–0.1)
Basophils Relative: 1 %
Blasts: 0 %
Eosinophils Absolute: 0.1 10*3/uL (ref 0–0.7)
Eosinophils Relative: 2 %
HCT: 50.8 % (ref 40.0–52.0)
Hemoglobin: 17.4 g/dL (ref 13.0–18.0)
Lymphocytes Relative: 20 %
Lymphs Abs: 1.3 10*3/uL (ref 1.0–3.6)
MCH: 32.7 pg (ref 26.0–34.0)
MCHC: 34.2 g/dL (ref 32.0–36.0)
MCV: 95.5 fL (ref 80.0–100.0)
Metamyelocytes Relative: 0 %
Monocytes Absolute: 0.3 10*3/uL (ref 0.2–1.0)
Monocytes Relative: 5 %
Myelocytes: 0 %
Neutro Abs: 4.9 10*3/uL (ref 1.4–6.5)
Neutrophils Relative %: 72 %
Other: 0 %
Platelets: 196 10*3/uL (ref 150–440)
Promyelocytes Absolute: 0 %
RBC: 5.32 MIL/uL (ref 4.40–5.90)
RDW: 14 % (ref 11.5–14.5)
WBC: 6.7 10*3/uL (ref 3.8–10.6)
nRBC: 0 /100 WBC

## 2017-06-24 LAB — FERRITIN: Ferritin: 24 ng/mL (ref 24–336)

## 2017-06-24 MED ORDER — HEPARIN SOD (PORK) LOCK FLUSH 100 UNIT/ML IV SOLN
500.0000 [IU] | Freq: Once | INTRAVENOUS | Status: AC
  Administered 2017-06-24: 500 [IU] via INTRAVENOUS

## 2017-06-24 MED ORDER — SODIUM CHLORIDE 0.9% FLUSH
10.0000 mL | INTRAVENOUS | Status: DC | PRN
  Administered 2017-06-24: 10 mL via INTRAVENOUS
  Filled 2017-06-24: qty 10

## 2017-06-24 NOTE — Progress Notes (Signed)
Patient's hemoglobin was 50.8 today.  Progress notes say to keep patient under 52 and phlebotomy orders say to draw if greater than 50.  Consulted with Dr. Mike Gip and we decided to not perform a phlebotomy today.  Patient is not symptomatic and is in agreement to not do a phlebotomy today.  Patient was encouraged to stop smoking. Patient verbalized understanding.

## 2017-06-25 LAB — CEA: CEA: 8.5 ng/mL — ABNORMAL HIGH (ref 0.0–4.7)

## 2017-07-03 ENCOUNTER — Ambulatory Visit
Admission: RE | Admit: 2017-07-03 | Discharge: 2017-07-03 | Disposition: A | Discharge status: Home / Self Care | Payer: Medicare Other | Source: Ambulatory Visit | Attending: Urgent Care | Admitting: Urgent Care

## 2017-07-03 ENCOUNTER — Ambulatory Visit: Admission: RE | Admit: 2017-07-03 | Payer: Medicare Other | Source: Ambulatory Visit

## 2017-07-03 DIAGNOSIS — I700 Atherosclerosis of aorta: Secondary | ICD-10-CM | POA: Diagnosis not present

## 2017-07-03 DIAGNOSIS — D3501 Benign neoplasm of right adrenal gland: Secondary | ICD-10-CM | POA: Diagnosis not present

## 2017-07-03 DIAGNOSIS — R911 Solitary pulmonary nodule: Secondary | ICD-10-CM | POA: Diagnosis present

## 2017-07-03 DIAGNOSIS — J439 Emphysema, unspecified: Secondary | ICD-10-CM | POA: Diagnosis not present

## 2017-07-03 DIAGNOSIS — C182 Malignant neoplasm of ascending colon: Secondary | ICD-10-CM | POA: Diagnosis present

## 2017-07-03 MED ORDER — IOHEXOL 300 MG/ML  SOLN
100.0000 mL | Freq: Once | INTRAMUSCULAR | Status: AC | PRN
  Administered 2017-07-03: 100 mL via INTRAVENOUS

## 2017-07-07 NOTE — Progress Notes (Signed)
[facility] day:  07/08/2017   Chief Complaint: William Ford is a 67 y.o. male with stage IIB colon cancer and polycythemia who is seen for 3 month assessment and review of interval CT scans.   HPI: The patient was last seen in the medical oncology clinic on 04/29/2017.  At that time, he was doing well.  He denied acute complaints.  Patient had chronic pain in his shoulder.  Patient was taking cyclobenzaprine, which he noted to be effective.  Patient had lost 5 pounds since 01/2017.  Patient continued to smoke.  Exam was stable.  WBC 9200 with an [place] of 6700.  Hemoglobin 16.9, hematocrit 49.2, MCV 96.2, and platelets 152,000.  Sodium low at 133.  CEA persistently elevated at 8.7 (previously 8.9).  Ferritin normal at 71.  Patient did not receive a therapeutic phlebotomy.  Labs on 06/24/2017.  WBC 6700 with an [place] of 4900.  Hemoglobin 17.4, hematocrit 50.8, MCV 95.5, and platelets 196,000.  Sodium continued to be low at 133.  Potassium low at 3.4.  CEA was 8.5.  Ferritin had dropped to 24 (previously 71).  Patient did not receive a therapeutic phlebotomy.  Chest, abdomen and pelvic CT with contrast on 07/03/2017 revealed no evidence of recurrent or metastatic carcinoma within the abdomen or pelvis.  There was a stable 1.2 cm right adrenal nodule noted. Stable 2 mm right upper and lower lobe pulmonary nodules. Several new ground glass nodules in both upper lobes, with the largest measuring 9 mm, and felt to be likely of inflammatory or infectious etiology.  Follow-up chest CT was recommended in 6 months.  Symptomatically, patient described some early morning nausea this morning. Patient states, "I drank some Dr. Malachi Bonds. A good strong drink like that made my burp and then I felt better". Patient denies any other acute complaints. Patient denies bleeding; no hematochezia, melena, or gross hematuria. Patient denies B symptoms or recent infections. Patient is eating  "ok". He has lost 2 more pounds. Patient states, "It is just me and my mom at home. It seems like I eat better when I am around people. Food tastes better".   Patient denies pain in the clinic today. Patient continues to smoke less than half of a pack of cigarettes a day. Patient states, "I have a lot of half days. That means I only smoke half of the day. I am trying to quit".    Past Medical History:  Diagnosis Date  . Arthritis    hands  . Colon cancer ([facility])   . Emphysema of lung ([facility])    mild (per pt)  . Hypertension   . Numbness    toes and fingers (S/P chemo - per pt)    Past Surgical History:  Procedure Laterality Date  . AMPUTATION Right 2003   4th   . CATARACT EXTRACTION W/PHACO Right 02/02/2017   Procedure: CATARACT EXTRACTION PHACO AND INTRAOCULAR LENS PLACEMENT ([place] [facility]) RIGHT;  Surgeon: Leandrew Koyanagi, MD;  Location: [facility];  Service: Ophthalmology;  Laterality: Right;  . CATARACT EXTRACTION W/PHACO Left 02/25/2017   Procedure: CATARACT EXTRACTION PHACO AND INTRAOCULAR LENS PLACEMENT ([place]) left;  Surgeon: Leandrew Koyanagi, MD;  Location: [facility];  Service: Ophthalmology;  Laterality: Left;  . COLON SURGERY  2013    Family History  Problem Relation Age of Onset  . Cancer Maternal Grandfather     Social History:  reports that he has been smoking cigarettes.  He has a  22.50 pack-year smoking history. He has never used smokeless tobacco. He reports that he drinks about 1.2 oz of alcohol per week. He reports that he does not use drugs.  He started smoking at age 79.  He has stopped smoking several times.  He was smoking 1pack/week.  He has cut back over the past month.  He lives in [place].  The patient has a "little sweet thing" (girlfriend since December). He is alone today.  Allergies: No Known Allergies  Current Medications: Current Outpatient Medications  Medication Sig Dispense Refill  . CELEBREX 200 MG capsule Take 200 mg by mouth  daily.   0  . cyclobenzaprine (FLEXERIL) 5 MG tablet Take 1 tablet (5 mg total) by mouth 3 (three) times daily as needed for muscle spasms. 30 tablet 0  . gabapentin (NEURONTIN) 300 MG capsule Take 1 capsule by mouth daily.   0  . L-Methylfolate-Algae-B12-B6 (FOLTANX RF) 3-90.314-2-35 MG CAPS   0  . lisinopril (PRINIVIL,ZESTRIL) 20 MG tablet Take 20 mg by mouth daily.  0  . LYRICA 75 MG capsule take 1 capsule by mouth twice a day 60 capsule 3  . sildenafil (REVATIO) 20 MG tablet Take 2 tablets by mouth as needed.  0  . prednisoLONE acetate (PRED FORTE) 1 % ophthalmic suspension Place 1 drop into both eyes daily.  0   No current facility-administered medications for this visit.    Facility-Administered Medications Ordered in Other Visits  Medication Dose Route Frequency Provider Last Rate Last Dose  . sodium chloride 0.9 % injection 10 mL  10 mL Intravenous PRN Nolon Stalls C, MD   10 mL at 12/13/14 1043  . sodium chloride flush (NS) 0.9 % injection 10 mL  10 mL Intravenous PRN Lequita Asal, MD   10 mL at 08/20/16 1050    Review of Systems:  GENERAL:  Feels "alright".  No fevers, sweats.  Weight loss of 2 pounds. PERFORMANCE STATUS (ECOG): 0 HEENT:  No visual changes, runny nose, sore throat, mouth sores or tenderness. Lungs: No shortness of breath or cough.  No hemoptysis. Cardiac:  No chest pain, palpitations, orthopnea, or PND. GI:  Nausea this morning (see HPI).  No vomiting, diarrhea, constipation, melena or hematochezia. GU:  No urgency, frequency, dysuria, or hematuria. Musculoskeletal:  No back pain.  No joint pain.  No muscle tenderness. Extremities:  No pain or swelling. Skin:  No rashes or skin changes. Neuro:  Oxaliplatin neuropathy (stable).  No headache,weakness, balance or coordination issues. Endocrine:  No diabetes, thyroid issues, hot flashes or night sweats. Psych:  No mood changes, depression or anxiety. Pain:  No focal pain. Review of systems:  All  other systems reviewed and found to be negative.   Physical Exam: Blood pressure 125/80, pulse 93, temperature 97.9 F (36.6 C), temperature source Tympanic, resp. rate 18, weight 143 lb 1.3 oz (64.9 kg), SpO2 98 %. GENERAL:  Thin gentleman sitting comfortably in the exam room in no acute distress. MENTAL STATUS:  Alert and oriented to person, place and time. HEAD:  Alopecia.  Normocephalic, atraumatic, face symmetric, no Cushingoid features. EYES: Brown eyes.  Pupils equal round and reactive to light and accomodation.  No conjunctivitis or scleral icterus. ENT:  Oropharynx clear without lesion.  Tongue normal. Mucous membranes moist.  RESPIRATORY:  Clear to auscultation without rales, wheezes or rhonchi. CARDIOVASCULAR:  Regular rate and rhythm without murmur, rub or gallop. ABDOMEN:  Soft, non-tender, with active bowel sounds, and no hepatosplenomegaly.  No masses.  SKIN:  No rashes, ulcers or lesions. EXTREMITIES: Clubbing.  No edema, no skin discoloration or tenderness.  No palpable cords. LYMPH NODES: No palpable cervical, supraclavicular, axillary or inguinal adenopathy  NEUROLOGICAL: Unremarkable. PSYCH:  Appropriate.    No visits with results within 3 Day(s) from this visit.  Latest known visit with results is:  Infusion on 06/24/2017  Component Date Value Ref Range Status  . Ferritin 06/24/2017 24  24 - 336 ng/mL Final   Performed at [facility], [address] [place], [place] [address]  . CEA 06/24/2017 8.5* 0.0 - 4.7 ng/mL Final   Comment: (NOTE)                             Nonsmokers          <3.9                             Smokers             <5.6 [employer] Electrochemiluminescence Immunoassay (ECLIA) Values obtained with different assay methods or kits cannot be used interchangeably.  Results cannot be interpreted as absolute evidence of the presence or absence of malignant disease. Performed At: [facility] [place],  [place] [id number] Rush Farmer MD [id number] Performed at [facility], [address] [place], [place] [address]   . Sodium 06/24/2017 133* 135 - 145 mmol/L Final  . Potassium 06/24/2017 3.4* 3.5 - 5.1 mmol/L Final  . Chloride 06/24/2017 101  101 - 111 mmol/L Final  . CO2 06/24/2017 24  22 - 32 mmol/L Final  . Glucose, Bld 06/24/2017 162* 65 - 99 mg/dL Final  . BUN 06/24/2017 9  6 - 20 mg/dL Final  . Creatinine, Ser 06/24/2017 1.06  0.61 - 1.24 mg/dL Final  . Calcium 06/24/2017 8.9  8.9 - 10.3 mg/dL Final  . Total Protein 06/24/2017 7.8  6.5 - 8.1 g/dL Final  . Albumin 06/24/2017 4.0  3.5 - 5.0 g/dL Final  . AST 06/24/2017 27  15 - 41 U/L Final  . ALT 06/24/2017 11* 17 - 63 U/L Final  . Alkaline Phosphatase 06/24/2017 93  38 - 126 U/L Final  . Total Bilirubin 06/24/2017 0.6  0.3 - 1.2 mg/dL Final  . GFR calc non Af Amer 06/24/2017 >60  >60 mL/min Final  . GFR calc Af Amer 06/24/2017 >60  >60 mL/min Final   Comment: (NOTE) The eGFR has been calculated using the CKD EPI equation. This calculation has not been validated in all clinical situations. eGFR's persistently <60 mL/min signify possible Chronic Kidney Disease.   Georgiann Hahn gap 06/24/2017 8  5 - 15 Final   Performed at [facility], [address], [place], [place] [address]  . WBC 06/24/2017 6.7  3.8 - 10.6 K/uL Final  . RBC 06/24/2017 5.32  4.40 - 5.90 MIL/uL Final  . Hemoglobin 06/24/2017 17.4  13.0 - 18.0 g/dL Final  . HCT 06/24/2017 50.8  40.0 - 52.0 % Final  . MCV 06/24/2017 95.5  80.0 - 100.0 fL Final  . MCH 06/24/2017 32.7  26.0 - 34.0 pg Final  . MCHC 06/24/2017 34.2  32.0 - 36.0 g/dL Final  . RDW 06/24/2017 14.0  11.5 - 14.5 % Final  . Platelets 06/24/2017 196  150 - 440 K/uL Final  . Neutrophils Relative % 06/24/2017 72  % Final  .  Lymphocytes Relative 06/24/2017 20  % Final  . Monocytes Relative 06/24/2017 5  % Final  . Eosinophils Relative 06/24/2017 2  % Final  . Basophils Relative  06/24/2017 1  % Final  . Band Neutrophils 06/24/2017 0  % Final  . Metamyelocytes Relative 06/24/2017 0  % Final  . Myelocytes 06/24/2017 0  % Final  . Promyelocytes Absolute 06/24/2017 0  % Final  . Blasts 06/24/2017 0  % Final  . nRBC 06/24/2017 0  0 /100 WBC Final  . Other 06/24/2017 0  % Final  . Neutro Abs 06/24/2017 4.9  1.4 - 6.5 K/uL Final  . Lymphs Abs 06/24/2017 1.3  1.0 - 3.6 K/uL Final  . Monocytes Absolute 06/24/2017 0.3  0.2 - 1.0 K/uL Final  . Eosinophils Absolute 06/24/2017 0.1  0 - 0.7 K/uL Final  . Basophils Absolute 06/24/2017 0.1  0 - 0.1 K/uL Final   Performed at [facility], [address], [place],  [address]    Assessment:  William Ford is a 67 y.o. male with stage IIB colon cancer.  He underwent right colectomy on 11/05/2012.  Pathology revealed a T3N0M0 tumor in the cecum with 17 negative lymph nodes.  Circumferential margin was positive.  There was lymphovascular invasion. He had 1 deposit of metastases in peritoneum.  Mismatch repair was positive.  He received 12 cycles of FOLFOX chemotherapy from 12/31/2012 - 08/19/2013.  Chemotherapy was interrupted because of small bowel obstruction due to adhesions in 05/2013.  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  He has had an elevated CEA with negative imaging studies.  CEA was 10.7 on 12/31/2012, 13.4 on 06/17/2013, 11.7 on 07/18/2013, 8.3 on 09/28/2013, 8.0 on 12/30/2013, 9.6 on 05/17/2014, 9.8 on 10/25/2014, 10.3 on 05/09/2015, 10.4 on 07/25/2015, 8.4 on 08/08/2015, 8.8 on 11/07/2015, 8.3 on 02/06/2016, 18.8 on 05/28/2016, 8.8 on 08/20/2016, 8.9 on 01/07/2017, 8.7 on 04/29/2017, and 8.5 on 06/24/2017.    Last colonoscopy was in 2017 (unknown provider).   Chest, abdomen, and pelvic CT scan on 07/03/2014 revealed no evidence of metastatic disease.  There was a new irregular shaped pleural based pulmonary nodule in the periphery of the RUL most likely infectious/inflammatory.  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    Chest CT on 10/20/2014 revealed a stable small pleural-based area of nodularity in the right upper lobe (favor benign area of scarring). There was mild diffuse bronchial wall thickening with moderate centrilobular and mild paraseptal emphysema; imaging findings suggestive of underlying COPD.  Chest CT on 07/19/2015 revealed no acute cardiopulmonary abnormalities.  He has a peripheral scar like density within the right upper lobe.   Chest, abdomen, and pelvic CT on 07/03/2016 revealed slight enlargement of the right upper lobe subpleural pulmonary nodule (3 mm to 4.6 mm).  There was persistent irregularity of the gallbladder wall. There was a stable 12 mm right adrenal nodule.  There was mild enlargement of the prostate gland. 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  He is followed by vascular surgery.  PSA was 0.71 on 07/23/2016.  Chest CT on 01/14/2017 revealed an interval decrease in medial right upper lobe pulmonary nodule from 5 mm on prior study to 2 mm on the current exam.  There was a 1-2 mm right lower lobe pulmonary nodule is stable since 10/20/2014 consistent with benign process.   Chest, abdomen, and pelvic CT on 07/03/2017  revealed no evidence of recurrent or metastatic carcinoma within the abdomen or pelvis.  There was a stable 1.2 cm right adrenal nodule noted. Stable 2 mm right upper and lower lobe pulmonary nodules. Several new ground glass nodules in both upper lobes, with the largest measuring 9 mm, and felt to be likely of inflammatory or infectious etiology.  Follow-up chest CT recommended in 6 months.  He has secondary polycythemia secondary to smoking. Work-up on 07/25/2015 revealed a hematocrit of 57.2, hemoglobin 19.2, MCV 95.1, platelets 169,000, WBC 8300 with an ANC of 4900.  Erythropoietin level was 6.0 (2.6-18.5).   Testosterone level was 680.  JAK2 was negative for V617F and exon 12.  Ferritin was 90, iron saturation 24% , and TIBC 372.    He undergoes small volume phlebotomies (250 cc) to maintain a hematocrit < 52 and a hemoglobin < 18.  With an elevated hematocrit, he feels sluggish/drowsy.  He feels better 2 days after his phlebotomies.  He underwent phlebotomy (500 cc) on 07/25/2015 with subsequent fatigue.  Last phlebotomy was on 04/21/2017.  Symptomatically, he is doing well. He denies any acute concerns.. He continues to smoke.  Exam is unremarkable.   Plan: 1.  Review interval chest CT- stable 1.2 cm right adrenal nodule noted. Stable 2 mm right upper and lower lobe pulmonary nodules. Several new groundglass nodules in both upper lobes, with the largest measuring 9 mm, and felt to be likely of inflammatory or infectious etiology.  2.  Continue port flushes every 6-8 weeks. Discuss port removal following scheduled interval imaging.  3.  Encourage smoking cessation.  If he stops smoking permanently, he will likely not require future phlebotomies. 4.  RTC every other month for labs (hematocrit) and +/- therapeutic phlebotomy 5.  Schedule chest CT on 01/02/2018 to follow up on new ground glass nodule in bilateral upper lung lobes.  6.  RTC in 6 months for MD assessment, labs (CBC with diff, CMP, CEA), review of chest CT, and +/- phlebotomy.   Honor Loh, NP  07/08/2017, 11:35 AM   I saw and evaluated the patient, participating in the key portions of the service and reviewing pertinent diagnostic studies and records.  I reviewed the nurse practitioner's note and agree with the findings and the plan.  The assessment and plan were discussed with the patient.  Several questions were asked by the patient and answered.   Nolon Stalls, MD 07/08/2017,11:35 AM

## 2017-07-08 ENCOUNTER — Inpatient Hospital Stay: Payer: Medicare Other | Attending: Hematology and Oncology | Admitting: Hematology and Oncology

## 2017-07-08 ENCOUNTER — Encounter: Payer: Self-pay | Admitting: Hematology and Oncology

## 2017-07-08 ENCOUNTER — Other Ambulatory Visit: Payer: Medicare Other

## 2017-07-08 VITALS — BP 125/80 | HR 93 | Temp 97.9°F | Resp 18 | Wt 143.1 lb

## 2017-07-08 DIAGNOSIS — D751 Secondary polycythemia: Secondary | ICD-10-CM | POA: Insufficient documentation

## 2017-07-08 DIAGNOSIS — Z85038 Personal history of other malignant neoplasm of large intestine: Secondary | ICD-10-CM

## 2017-07-08 DIAGNOSIS — F17210 Nicotine dependence, cigarettes, uncomplicated: Secondary | ICD-10-CM | POA: Diagnosis not present

## 2017-07-08 DIAGNOSIS — C182 Malignant neoplasm of ascending colon: Secondary | ICD-10-CM

## 2017-07-08 DIAGNOSIS — R911 Solitary pulmonary nodule: Secondary | ICD-10-CM

## 2017-07-08 DIAGNOSIS — Z7189 Other specified counseling: Secondary | ICD-10-CM | POA: Insufficient documentation

## 2017-07-08 NOTE — Progress Notes (Signed)
Patient states this morning after eating breakfast he had heartburn.  After being able to "belch" he felt better.  He had nausea yesterday morning. Otherwise, no complaints.

## 2017-08-18 ENCOUNTER — Ambulatory Visit (INDEPENDENT_AMBULATORY_CARE_PROVIDER_SITE_OTHER): Payer: Medicare Other | Admitting: Vascular Surgery

## 2017-08-18 ENCOUNTER — Encounter (INDEPENDENT_AMBULATORY_CARE_PROVIDER_SITE_OTHER): Payer: Medicare Other

## 2017-09-01 ENCOUNTER — Encounter (INDEPENDENT_AMBULATORY_CARE_PROVIDER_SITE_OTHER): Payer: Medicare Other

## 2017-09-01 ENCOUNTER — Ambulatory Visit (INDEPENDENT_AMBULATORY_CARE_PROVIDER_SITE_OTHER): Payer: Medicare Other | Admitting: Vascular Surgery

## 2017-09-02 ENCOUNTER — Inpatient Hospital Stay: Payer: Medicare Other

## 2017-09-02 ENCOUNTER — Inpatient Hospital Stay: Payer: Medicare Other | Attending: Hematology and Oncology

## 2017-09-02 DIAGNOSIS — Z85038 Personal history of other malignant neoplasm of large intestine: Secondary | ICD-10-CM | POA: Diagnosis not present

## 2017-09-02 DIAGNOSIS — C182 Malignant neoplasm of ascending colon: Secondary | ICD-10-CM

## 2017-09-02 DIAGNOSIS — D751 Secondary polycythemia: Secondary | ICD-10-CM | POA: Diagnosis not present

## 2017-09-02 DIAGNOSIS — Z452 Encounter for adjustment and management of vascular access device: Secondary | ICD-10-CM | POA: Insufficient documentation

## 2017-09-02 LAB — COMPREHENSIVE METABOLIC PANEL
ALT: 16 U/L — ABNORMAL LOW (ref 17–63)
AST: 35 U/L (ref 15–41)
Albumin: 3.9 g/dL (ref 3.5–5.0)
Alkaline Phosphatase: 89 U/L (ref 38–126)
Anion gap: 11 (ref 5–15)
BUN: 6 mg/dL (ref 6–20)
CO2: 22 mmol/L (ref 22–32)
Calcium: 9.3 mg/dL (ref 8.9–10.3)
Chloride: 100 mmol/L — ABNORMAL LOW (ref 101–111)
Creatinine, Ser: 1.05 mg/dL (ref 0.61–1.24)
GFR calc Af Amer: >60 mL/min (ref >60)
GFR calc non Af Amer: >60 mL/min (ref >60)
Glucose, Bld: 137 mg/dL — ABNORMAL HIGH (ref 65–99)
Potassium: 4 mmol/L (ref 3.5–5.1)
Sodium: 133 mmol/L — ABNORMAL LOW (ref 135–145)
Total Bilirubin: 0.6 mg/dL (ref 0.3–1.2)
Total Protein: 7.9 g/dL (ref 6.5–8.1)

## 2017-09-02 LAB — CBC WITH DIFFERENTIAL/PLATELET
Basophils Absolute: 0.1 10*3/uL (ref 0–0.1)
Basophils Relative: 1 %
Eosinophils Absolute: 0.1 10*3/uL (ref 0–0.7)
Eosinophils Relative: 1 %
HCT: 50.9 % (ref 40.0–52.0)
Hemoglobin: 17.2 g/dL (ref 13.0–18.0)
Lymphocytes Relative: 27 %
Lymphs Abs: 2.2 10*3/uL (ref 1.0–3.6)
MCH: 32 pg (ref 26.0–34.0)
MCHC: 33.8 g/dL (ref 32.0–36.0)
MCV: 94.6 fL (ref 80.0–100.0)
Monocytes Absolute: 0.6 10*3/uL (ref 0.2–1.0)
Monocytes Relative: 8 %
Neutro Abs: 5 10*3/uL (ref 1.4–6.5)
Neutrophils Relative %: 63 %
Platelets: 160 10*3/uL (ref 150–440)
RBC: 5.39 MIL/uL (ref 4.40–5.90)
RDW: 14.3 % (ref 11.5–14.5)
WBC: 8 10*3/uL (ref 3.8–10.6)

## 2017-09-02 MED ORDER — SODIUM CHLORIDE 0.9% FLUSH
10.0000 mL | INTRAVENOUS | Status: DC | PRN
  Administered 2017-09-02: 10 mL via INTRAVENOUS
  Filled 2017-09-02: qty 10

## 2017-09-02 MED ORDER — HEPARIN SOD (PORK) LOCK FLUSH 100 UNIT/ML IV SOLN
500.0000 [IU] | Freq: Once | INTRAVENOUS | Status: AC
  Administered 2017-09-02: 500 [IU] via INTRAVENOUS

## 2017-09-02 MED ORDER — HEPARIN SOD (PORK) LOCK FLUSH 100 UNIT/ML IV SOLN
INTRAVENOUS | Status: AC
  Filled 2017-09-02: qty 5

## 2017-09-03 LAB — CEA: CEA: 7.5 ng/mL — ABNORMAL HIGH (ref 0.0–4.7)

## 2017-10-27 ENCOUNTER — Inpatient Hospital Stay: Payer: Medicare Other | Attending: Hematology and Oncology

## 2017-10-27 ENCOUNTER — Inpatient Hospital Stay: Payer: Medicare Other

## 2017-10-27 VITALS — BP 137/79 | HR 82 | Temp 96.9°F | Resp 18

## 2017-10-27 DIAGNOSIS — Z85038 Personal history of other malignant neoplasm of large intestine: Secondary | ICD-10-CM | POA: Diagnosis present

## 2017-10-27 DIAGNOSIS — C182 Malignant neoplasm of ascending colon: Secondary | ICD-10-CM

## 2017-10-27 DIAGNOSIS — Z452 Encounter for adjustment and management of vascular access device: Secondary | ICD-10-CM | POA: Diagnosis not present

## 2017-10-27 DIAGNOSIS — D751 Secondary polycythemia: Secondary | ICD-10-CM

## 2017-10-27 LAB — HEMATOCRIT: HCT: 50.7 % (ref 40.0–52.0)

## 2017-10-27 MED ORDER — HEPARIN SOD (PORK) LOCK FLUSH 100 UNIT/ML IV SOLN
500.0000 [IU] | Freq: Once | INTRAVENOUS | Status: AC
  Administered 2017-10-27: 500 [IU] via INTRAVENOUS

## 2017-10-27 MED ORDER — SODIUM CHLORIDE 0.9% FLUSH
10.0000 mL | INTRAVENOUS | Status: DC | PRN
  Administered 2017-10-27: 10 mL via INTRAVENOUS
  Filled 2017-10-27: qty 10

## 2017-10-27 NOTE — Progress Notes (Signed)
Unable to do phlebotomy through port not enough blood return. Started peripheral line removed 245ml of blood.

## 2017-10-28 ENCOUNTER — Other Ambulatory Visit: Payer: Medicare Other

## 2017-12-23 ENCOUNTER — Inpatient Hospital Stay: Payer: Medicare Other | Attending: Hematology and Oncology

## 2017-12-23 ENCOUNTER — Inpatient Hospital Stay: Payer: Medicare Other

## 2017-12-23 VITALS — BP 114/75 | HR 75 | Temp 96.8°F | Resp 18

## 2017-12-23 DIAGNOSIS — C182 Malignant neoplasm of ascending colon: Secondary | ICD-10-CM

## 2017-12-23 DIAGNOSIS — D751 Secondary polycythemia: Secondary | ICD-10-CM | POA: Insufficient documentation

## 2017-12-23 LAB — HEMATOCRIT: HCT: 54 % — ABNORMAL HIGH (ref 40.0–52.0)

## 2017-12-23 MED ORDER — SODIUM CHLORIDE 0.9% FLUSH
10.0000 mL | INTRAVENOUS | Status: DC | PRN
  Administered 2017-12-23: 10 mL via INTRAVENOUS
  Filled 2017-12-23: qty 10

## 2017-12-23 MED ORDER — HEPARIN SOD (PORK) LOCK FLUSH 100 UNIT/ML IV SOLN
500.0000 [IU] | Freq: Once | INTRAVENOUS | Status: AC
  Administered 2017-12-23: 500 [IU] via INTRAVENOUS

## 2017-12-23 NOTE — Patient Instructions (Signed)

## 2018-01-01 ENCOUNTER — Ambulatory Visit: Payer: Medicare Other | Attending: Urgent Care

## 2018-01-06 ENCOUNTER — Inpatient Hospital Stay: Payer: Medicare Other

## 2018-01-06 ENCOUNTER — Encounter: Payer: Self-pay | Admitting: Hematology and Oncology

## 2018-01-06 ENCOUNTER — Inpatient Hospital Stay: Payer: Medicare Other | Attending: Hematology and Oncology | Admitting: Hematology and Oncology

## 2018-01-06 ENCOUNTER — Other Ambulatory Visit: Payer: Self-pay

## 2018-01-06 VITALS — BP 143/71 | HR 75 | Temp 97.2°F | Ht 73.0 in | Wt 140.5 lb

## 2018-01-06 DIAGNOSIS — D751 Secondary polycythemia: Secondary | ICD-10-CM | POA: Diagnosis not present

## 2018-01-06 DIAGNOSIS — C182 Malignant neoplasm of ascending colon: Secondary | ICD-10-CM

## 2018-01-06 DIAGNOSIS — R978 Other abnormal tumor markers: Secondary | ICD-10-CM

## 2018-01-06 DIAGNOSIS — N400 Enlarged prostate without lower urinary tract symptoms: Secondary | ICD-10-CM

## 2018-01-06 DIAGNOSIS — Z85038 Personal history of other malignant neoplasm of large intestine: Secondary | ICD-10-CM

## 2018-01-06 DIAGNOSIS — Z452 Encounter for adjustment and management of vascular access device: Secondary | ICD-10-CM | POA: Diagnosis not present

## 2018-01-06 DIAGNOSIS — R911 Solitary pulmonary nodule: Secondary | ICD-10-CM | POA: Diagnosis not present

## 2018-01-06 LAB — COMPREHENSIVE METABOLIC PANEL
ALT: 16 U/L (ref 0–44)
AST: 26 U/L (ref 15–41)
Albumin: 4.2 g/dL (ref 3.5–5.0)
Alkaline Phosphatase: 91 U/L (ref 38–126)
Anion gap: 5 (ref 5–15)
BUN: 9 mg/dL (ref 8–23)
CO2: 26 mmol/L (ref 22–32)
Calcium: 9.2 mg/dL (ref 8.9–10.3)
Chloride: 102 mmol/L (ref 98–111)
Creatinine, Ser: 1.13 mg/dL (ref 0.61–1.24)
GFR calc Af Amer: >60 mL/min (ref >60)
GFR calc non Af Amer: >60 mL/min (ref >60)
Glucose, Bld: 94 mg/dL (ref 70–99)
Potassium: 4.4 mmol/L (ref 3.5–5.1)
Sodium: 133 mmol/L — ABNORMAL LOW (ref 135–145)
Total Bilirubin: 1 mg/dL (ref 0.3–1.2)
Total Protein: 8.1 g/dL (ref 6.5–8.1)

## 2018-01-06 LAB — CBC WITH DIFFERENTIAL/PLATELET
Abs Immature Granulocytes: 0.02 10*3/uL (ref 0.00–0.07)
Basophils Absolute: 0.1 10*3/uL (ref 0.0–0.1)
Basophils Relative: 1 %
Eosinophils Absolute: 0 10*3/uL (ref 0.0–0.5)
Eosinophils Relative: 0 %
HCT: 53.5 % — ABNORMAL HIGH (ref 39.0–52.0)
Hemoglobin: 17.3 g/dL — ABNORMAL HIGH (ref 13.0–17.0)
Immature Granulocytes: 0 %
Lymphocytes Relative: 27 %
Lymphs Abs: 1.9 10*3/uL (ref 0.7–4.0)
MCH: 31.2 pg (ref 26.0–34.0)
MCHC: 32.3 g/dL (ref 30.0–36.0)
MCV: 96.6 fL (ref 80.0–100.0)
Monocytes Absolute: 0.7 10*3/uL (ref 0.1–1.0)
Monocytes Relative: 9 %
Neutro Abs: 4.5 10*3/uL (ref 1.7–7.7)
Neutrophils Relative %: 63 %
Platelets: 129 10*3/uL — ABNORMAL LOW (ref 150–400)
RBC: 5.54 MIL/uL (ref 4.22–5.81)
RDW: 13.4 % (ref 11.5–15.5)
WBC: 7.2 10*3/uL (ref 4.0–10.5)
nRBC: 0 % (ref 0.0–0.2)

## 2018-01-06 MED ORDER — HEPARIN SOD (PORK) LOCK FLUSH 100 UNIT/ML IV SOLN
INTRAVENOUS | Status: AC
  Filled 2018-01-06: qty 5

## 2018-01-06 MED ORDER — HEPARIN SOD (PORK) LOCK FLUSH 100 UNIT/ML IV SOLN
500.0000 [IU] | Freq: Once | INTRAVENOUS | Status: AC
  Administered 2018-01-06: 500 [IU] via INTRAVENOUS

## 2018-01-06 MED ORDER — SODIUM CHLORIDE 0.9% FLUSH
10.0000 mL | INTRAVENOUS | Status: DC | PRN
  Administered 2018-01-06: 10 mL via INTRAVENOUS
  Filled 2018-01-06: qty 10

## 2018-01-06 NOTE — Progress Notes (Signed)
[facility] day:  01/06/2018   Chief Complaint: William Ford is a 67 y.o. male with stage IIB colon cancer and polycythemia who is seen for 6 month assessment.   HPI: The patient was last seen in the medical oncology clinic on 07/08/2017.  At that time, he was doing well. He denied any acute concerns. He continued to smoke.  Exam was unremarkable.  Hematocrit was 50.8 on 06/24/2017, 50.9 on 09/02/2017, 50.7 on 10/27/2017, and 54.0 on 12/23/2017.  He underwent phlebotomy on 12/23/2017.  He missed his follow-up chest CT on 01/01/2018.  During the interim, patient is doing well overall. Patient denies that he has experienced any B symptoms. He denies any interval infections. Patient denies bleeding; no hematochezia, melena, or gross hematuria. Patient with persistent non-productive cough. Patient denies bleeding; no hematochezia, melena, or gross hematuria.   He feels "funny all over his body" following his phlebotomies. He denies post-procedural chest pain, shortness of breath, palpitations, and vertigo symptoms. Patient also indicates that he feels "sluggish" when his blood levels get too high.   Patient advises that he maintains an adequate appetite. He is eating well. Weight today is 140 lb 8.7 oz (63.7 kg), which compared to his last visit to the clinic, represents a 3 pound decrease.  He states that he got all of his teeth pulled.  He was initially limited to a soft diet.  He now has dentures.  Patient denies pain in the clinic today. Patient is making efforts to quit smoking. Patient states, "me and my girlfriend have this thing going on. She has been to the doctor and got medicine to quit. I am going to follow her. I don't need the medicine though".    Past Medical History:  Diagnosis Date  . Arthritis    hands  . Colon cancer ([facility])   . Emphysema of lung ([facility])    mild (per pt)  . Hypertension   . Numbness    toes and fingers (S/P chemo -  per pt)    Past Surgical History:  Procedure Laterality Date  . AMPUTATION Right 2003   4th   . CATARACT EXTRACTION W/PHACO Right 02/02/2017   Procedure: CATARACT EXTRACTION PHACO AND INTRAOCULAR LENS PLACEMENT ([place]) RIGHT;  Surgeon: Leandrew Koyanagi, MD;  Location: [facility];  Service: Ophthalmology;  Laterality: Right;  . CATARACT EXTRACTION W/PHACO Left 02/25/2017   Procedure: CATARACT EXTRACTION PHACO AND INTRAOCULAR LENS PLACEMENT ([place]) left;  Surgeon: Leandrew Koyanagi, MD;  Location: [facility];  Service: Ophthalmology;  Laterality: Left;  . COLON SURGERY  2013    Family History  Problem Relation Age of Onset  . Cancer Maternal Grandfather     Social History:  reports that he has been smoking cigarettes. He has a 22.50 pack-year smoking history. He has never used smokeless tobacco. He reports that he drinks about 2.0 standard drinks of alcohol per week. He reports that he does not use drugs.  He started smoking at age 84.  He has stopped smoking several times.  He was smoking 1pack/week.  He has cut back over the past month.  He lives in [place].  The patient has a "little sweet thing" (girlfriend since December). He is alone today.  Allergies: No Known Allergies  Current Medications: Current Outpatient Medications  Medication Sig Dispense Refill  . CELEBREX 200 MG capsule Take 200 mg by mouth daily.   0  . cyclobenzaprine (FLEXERIL) 5 MG tablet Take 1 tablet (  5 mg total) by mouth 3 (three) times daily as needed for muscle spasms. 30 tablet 0  . gabapentin (NEURONTIN) 300 MG capsule Take 1 capsule by mouth daily.   0  . L-Methylfolate-Algae-B12-B6 (FOLTANX RF) 3-90.314-2-35 MG CAPS   0  . lisinopril (PRINIVIL,ZESTRIL) 20 MG tablet Take 20 mg by mouth daily.  0  . LYRICA 75 MG capsule take 1 capsule by mouth twice a day 60 capsule 3  . prednisoLONE acetate (PRED FORTE) 1 % ophthalmic suspension Place 1 drop into the left eye 2 (two) times daily.   0   . sildenafil (REVATIO) 20 MG tablet Take 2 tablets by mouth as needed.  0   No current facility-administered medications for this visit.    Facility-Administered Medications Ordered in Other Visits  Medication Dose Route Frequency Provider Last Rate Last Dose  . sodium chloride 0.9 % injection 10 mL  10 mL Intravenous PRN Nolon Stalls C, MD   10 mL at 12/13/14 1043  . sodium chloride flush (NS) 0.9 % injection 10 mL  10 mL Intravenous PRN Nolon Stalls C, MD   10 mL at 08/20/16 1050    Review of Systems  Constitutional: Positive for weight loss (down 3 pounds). Negative for diaphoresis, fever and malaise/fatigue.  HENT: Negative.   Eyes: Negative.   Respiratory: Positive for cough (chronic; non-productive). Negative for hemoptysis, sputum production and shortness of breath.   Cardiovascular: Negative for chest pain, palpitations, orthopnea, leg swelling and PND.  Gastrointestinal: Negative for abdominal pain, blood in stool, constipation, diarrhea, melena, nausea and vomiting.  Genitourinary: Negative for dysuria, frequency, hematuria and urgency.  Musculoskeletal: Negative for back pain, falls, joint pain and myalgias.  Skin: Negative for itching and rash.  Neurological: Positive for sensory change (stable neuropathy in hands). Negative for dizziness, tremors, weakness and headaches.  Endo/Heme/Allergies: Does not bruise/bleed easily.  Psychiatric/Behavioral: Negative for depression, memory loss and suicidal ideas. The patient is not nervous/anxious and does not have insomnia.   All other systems reviewed and are negative.  Performance status (ECOG): 0 - Asymptomatic  Vital Signs BP (!) 143/71 (BP Location: Right Arm, Patient Position: Sitting)   Pulse 75   Temp (!) 97.2 F (36.2 C) (Tympanic)   Ht 6' 1"  (1.854 m)   Wt 140 lb 8.7 oz (63.7 kg)   SpO2 100%   BMI 18.54 kg/m   Physical Exam  Constitutional: He is oriented to person, place, and time and well-developed,  well-nourished, and in no distress.  HENT:  Head: Normocephalic and atraumatic.  Mouth/Throat: Oropharynx is clear and moist and mucous membranes are normal.  Alopecia.  White goatee.  Eyes: Pupils are equal, round, and reactive to light. EOM are normal. No scleral icterus.  Glasses.  Brown eyes.   Neck: Normal range of motion. Neck supple. No JVD present.  Cardiovascular: Normal rate, regular rhythm, normal heart sounds and intact distal pulses. Exam reveals no gallop and no friction rub.  No murmur heard. Pulmonary/Chest: Effort normal and breath sounds normal. No respiratory distress. He has no wheezes. He has no rales.  Abdominal: Soft. Bowel sounds are normal. He exhibits no distension and no mass. There is no tenderness. There is no rebound and no guarding.  Musculoskeletal: Normal range of motion. He exhibits no edema or tenderness.  Lymphadenopathy:    He has no cervical adenopathy.    He has no axillary adenopathy.       Right: No inguinal and no supraclavicular adenopathy present.  Left: No inguinal and no supraclavicular adenopathy present.  Neurological: He is alert and oriented to person, place, and time. Gait normal.  Skin: Skin is warm and dry. No rash noted. No erythema.  Psychiatric: Mood, affect and judgment normal.  Nursing note and vitals reviewed.   Appointment on 01/06/2018  Component Date Value Ref Range Status  . Sodium 01/06/2018 133* 135 - 145 mmol/L Final  . Potassium 01/06/2018 4.4  3.5 - 5.1 mmol/L Final  . Chloride 01/06/2018 102  98 - 111 mmol/L Final  . CO2 01/06/2018 26  22 - 32 mmol/L Final  . Glucose, Bld 01/06/2018 94  70 - 99 mg/dL Final  . BUN 01/06/2018 9  8 - 23 mg/dL Final  . Creatinine, Ser 01/06/2018 1.13  0.61 - 1.24 mg/dL Final  . Calcium 01/06/2018 9.2  8.9 - 10.3 mg/dL Final  . Total Protein 01/06/2018 8.1  6.5 - 8.1 g/dL Final  . Albumin 01/06/2018 4.2  3.5 - 5.0 g/dL Final  . AST 01/06/2018 26  15 - 41 U/L Final  . ALT  01/06/2018 16  0 - 44 U/L Final  . Alkaline Phosphatase 01/06/2018 91  38 - 126 U/L Final  . Total Bilirubin 01/06/2018 1.0  0.3 - 1.2 mg/dL Final  . GFR calc non Af Amer 01/06/2018 >60  >60 mL/min Final  . GFR calc Af Amer 01/06/2018 >60  >60 mL/min Final   Comment: (NOTE) The eGFR has been calculated using the CKD EPI equation. This calculation has not been validated in all clinical situations. eGFR's persistently <60 mL/min signify possible Chronic Kidney Disease.   Georgiann Hahn gap 01/06/2018 5  5 - 15 Final   Performed at [facility], [address], [place], [place] [address]  . WBC 01/06/2018 7.2  4.0 - 10.5 K/uL Final  . RBC 01/06/2018 5.54  4.22 - 5.81 MIL/uL Final  . Hemoglobin 01/06/2018 17.3* 13.0 - 17.0 g/dL Final  . HCT 01/06/2018 53.5* 39.0 - 52.0 % Final  . MCV 01/06/2018 96.6  80.0 - 100.0 fL Final  . MCH 01/06/2018 31.2  26.0 - 34.0 pg Final  . MCHC 01/06/2018 32.3  30.0 - 36.0 g/dL Final  . RDW 01/06/2018 13.4  11.5 - 15.5 % Final  . Platelets 01/06/2018 129* 150 - 400 K/uL Final  . nRBC 01/06/2018 0.0  0.0 - 0.2 % Final  . Neutrophils Relative % 01/06/2018 63  % Final  . Neutro Abs 01/06/2018 4.5  1.7 - 7.7 K/uL Final  . Lymphocytes Relative 01/06/2018 27  % Final  . Lymphs Abs 01/06/2018 1.9  0.7 - 4.0 K/uL Final  . Monocytes Relative 01/06/2018 9  % Final  . Monocytes Absolute 01/06/2018 0.7  0.1 - 1.0 K/uL Final  . Eosinophils Relative 01/06/2018 0  % Final  . Eosinophils Absolute 01/06/2018 0.0  0.0 - 0.5 K/uL Final  . Basophils Relative 01/06/2018 1  % Final  . Basophils Absolute 01/06/2018 0.1  0.0 - 0.1 K/uL Final  . Immature Granulocytes 01/06/2018 0  % Final  . Abs Immature Granulocytes 01/06/2018 0.02  0.00 - 0.07 K/uL Final   Performed at [facility], [address] [place], [place] [address]    Assessment:  Thaison Kolodziejski is a 67 y.o. male with stage IIB colon cancer.  He underwent right colectomy on 11/05/2012.  Pathology  revealed a T3N0M0 tumor in the cecum with 17 negative lymph nodes.  Circumferential margin was positive.  There was  lymphovascular invasion. He had 1 deposit of metastases in peritoneum.  Mismatch repair was positive.  He received 12 cycles of FOLFOX chemotherapy from 12/31/2012 - 08/19/2013.  Chemotherapy was interrupted because of small bowel obstruction due to adhesions in 05/2013.  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  He has had an elevated CEA with negative imaging studies.  CEA was 10.7 on 12/31/2012, 13.4 on 06/17/2013, 11.7 on 07/18/2013, 8.3 on 09/28/2013, 8.0 on 12/30/2013, 9.6 on 05/17/2014, 9.8 on 10/25/2014, 10.3 on 05/09/2015, 10.4 on 07/25/2015, 8.4 on 08/08/2015, 8.8 on 11/07/2015, 8.3 on 02/06/2016, 18.8 on 05/28/2016, 8.8 on 08/20/2016, 8.9 on 01/07/2017, 8.7 on 04/29/2017, 8.5 on 06/24/2017, 7.5 on 09/02/2017, and 10.0 on 01/06/2018.    Last colonoscopy was in 2017 (unknown provider).   Chest, abdomen, and pelvic CT scan on 07/03/2014 revealed no evidence of metastatic disease.  There was a new irregular shaped pleural based pulmonary nodule in the periphery of the RUL most likely infectious/inflammatory. 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    Chest CT on 10/20/2014 revealed a stable small pleural-based area of nodularity in the right upper lobe (favor benign area of scarring). There was mild diffuse bronchial wall thickening with moderate centrilobular and mild paraseptal emphysema; imaging findings suggestive of underlying COPD.  Chest CT on 07/19/2015 revealed no acute cardiopulmonary abnormalities.  He has a peripheral scar like density within the right upper lobe.   Chest, abdomen, and pelvic CT on 07/03/2016 revealed slight enlargement of the right upper lobe subpleural pulmonary nodule (3 mm to 4.6 mm).  There was persistent irregularity of the gallbladder wall. There was a stable 12 mm right adrenal nodule.  There was mild enlargement of the  prostate gland. 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  He is followed by vascular surgery.  PSA was 0.71 on 07/23/2016.  Chest CT on 01/14/2017 revealed an interval decrease in medial right upper lobe pulmonary nodule from 5 mm on prior study to 2 mm on the current exam.  There was a 1-2 mm right lower lobe pulmonary nodule is stable since 10/20/2014 consistent with benign process.   Chest, abdomen, and pelvic CT on 07/03/2017 revealed no evidence of recurrent or metastatic carcinoma within the abdomen or pelvis.  There was a stable 1.2 cm right adrenal nodule noted. Stable 2 mm right upper and lower lobe pulmonary nodules. Several new ground glass nodules in both upper lobes, with the largest measuring 9 mm, and felt to be likely of inflammatory or infectious etiology.  Follow-up chest CT recommended in 6 months.  He has secondary polycythemia secondary to smoking. Work-up on 07/25/2015 revealed a hematocrit of 57.2, hemoglobin 19.2, MCV 95.1, platelets 169,000, WBC 8300 with an ANC of 4900.  Erythropoietin level was 6.0 (2.6-18.5).  Testosterone level was 680.  JAK2 was negative for V617F and exon 12.  Ferritin was 90, iron saturation 24% , and TIBC 372.    He undergoes small volume phlebotomies (250 cc) to maintain a hematocrit < 52 and a hemoglobin < 18.  With an elevated hematocrit, he feels sluggish/drowsy.  He feels better 2 days after his phlebotomies.  He underwent phlebotomy (500 cc) on 07/25/2015 with subsequent fatigue.  Last phlebotomy was on 04/21/2017.  Symptomatically, he is doing well overall.  He denies any acute concerns.  Patient notes that he feels "funny all over his body" following small-volume therapeutic phlebotomies.  He denies postprocedural chest pain or shortness of breath.  Patient notes that he feels "sluggish" when his blood levels become too high.  Exam is  grossly unremarkable.  Hemoglobin 17.3, hematocrit 53.5, and platelets 129,000.  Sodium low at 133.  CEA is elevated at 10.0 ng/mL (previously 7.5).    Plan: 1. Labs today:  CBC with diff, CMP, CEA. 2. Stage IIB colon cancer  Doing well overall.  No symptoms (no bleeding or weight loss).  CEA elevated at 10.0 pg/mL (previously 7.5).  Follow-up CEA.  If continues to increase, will move up scheduled imaging before 06/2018. 3. Secondary polycythemia  Hemoglobin 17.3 and hematocrit 53.5.  Review goal to maintain hemoglobin <18 and hematocrit < 52.  Patient will require a small volume (250 cc) therapeutic phlebotomy today. 4. Pulmonary nodules  Missed scheduled interval CT imaging of the chest to follow-up on known pulmonary nodules.  Reschedule chest CT for next available. 5. Continue port flushes every 6 to 8 weeks. 6. RTC and 1 month for MD assessment, labs (hematocrit), review of scan, and +/-small-volume therapeutic phlebotomy.   Honor Loh, NP  01/06/2018, 11:53 AM   I saw and evaluated the patient, participating in the key portions of the service and reviewing pertinent diagnostic studies and records.  I reviewed the nurse practitioner's note and agree with the findings and the plan.  The assessment and plan were discussed with the patient.  Several questions were asked by the patient and answered.   Nolon Stalls, MD 01/06/2018,11:53 AM

## 2018-01-06 NOTE — Progress Notes (Signed)
No new changes noted today 

## 2018-01-07 LAB — CEA: CEA: 10 ng/mL — ABNORMAL HIGH (ref 0.0–4.7)

## 2018-01-11 ENCOUNTER — Ambulatory Visit
Admission: RE | Admit: 2018-01-11 | Discharge: 2018-01-11 | Disposition: A | Discharge status: Home / Self Care | Payer: Medicare Other | Source: Ambulatory Visit | Attending: Urgent Care | Admitting: Urgent Care

## 2018-01-11 DIAGNOSIS — I2510 Atherosclerotic heart disease of native coronary artery without angina pectoris: Secondary | ICD-10-CM | POA: Diagnosis not present

## 2018-01-11 DIAGNOSIS — I700 Atherosclerosis of aorta: Secondary | ICD-10-CM | POA: Diagnosis not present

## 2018-01-11 DIAGNOSIS — C182 Malignant neoplasm of ascending colon: Secondary | ICD-10-CM | POA: Insufficient documentation

## 2018-01-11 DIAGNOSIS — R911 Solitary pulmonary nodule: Secondary | ICD-10-CM | POA: Diagnosis present

## 2018-01-11 DIAGNOSIS — J439 Emphysema, unspecified: Secondary | ICD-10-CM | POA: Insufficient documentation

## 2018-02-03 ENCOUNTER — Inpatient Hospital Stay: Payer: Medicare Other | Attending: Hematology and Oncology | Admitting: Hematology and Oncology

## 2018-02-03 ENCOUNTER — Inpatient Hospital Stay: Payer: Medicare Other

## 2018-02-03 ENCOUNTER — Other Ambulatory Visit: Payer: Self-pay | Admitting: Hematology and Oncology

## 2018-02-03 VITALS — BP 115/73 | HR 80 | Temp 96.7°F | Resp 18 | Wt 141.5 lb

## 2018-02-03 DIAGNOSIS — C182 Malignant neoplasm of ascending colon: Secondary | ICD-10-CM

## 2018-02-03 DIAGNOSIS — Z452 Encounter for adjustment and management of vascular access device: Secondary | ICD-10-CM | POA: Diagnosis not present

## 2018-02-03 DIAGNOSIS — D751 Secondary polycythemia: Secondary | ICD-10-CM

## 2018-02-03 DIAGNOSIS — R911 Solitary pulmonary nodule: Secondary | ICD-10-CM

## 2018-02-03 DIAGNOSIS — R918 Other nonspecific abnormal finding of lung field: Secondary | ICD-10-CM | POA: Diagnosis not present

## 2018-02-03 DIAGNOSIS — I10 Essential (primary) hypertension: Secondary | ICD-10-CM

## 2018-02-03 DIAGNOSIS — R970 Elevated carcinoembryonic antigen [CEA]: Secondary | ICD-10-CM

## 2018-02-03 DIAGNOSIS — R978 Other abnormal tumor markers: Secondary | ICD-10-CM | POA: Insufficient documentation

## 2018-02-03 DIAGNOSIS — Z79899 Other long term (current) drug therapy: Secondary | ICD-10-CM

## 2018-02-03 DIAGNOSIS — F17210 Nicotine dependence, cigarettes, uncomplicated: Secondary | ICD-10-CM

## 2018-02-03 LAB — HEMATOCRIT: HCT: 48.8 % (ref 39.0–52.0)

## 2018-02-03 MED ORDER — SODIUM CHLORIDE 0.9% FLUSH
10.0000 mL | INTRAVENOUS | Status: AC | PRN
  Administered 2018-02-03: 10 mL via INTRAVENOUS
  Filled 2018-02-03: qty 10

## 2018-02-03 MED ORDER — HEPARIN SOD (PORK) LOCK FLUSH 100 UNIT/ML IV SOLN
500.0000 [IU] | Freq: Once | INTRAVENOUS | Status: AC
  Administered 2018-02-03: 500 [IU] via INTRAVENOUS
  Filled 2018-02-03: qty 5

## 2018-02-03 NOTE — Progress Notes (Signed)
[facility] day:  02/03/2018   Chief Complaint: William Ford is a 67 y.o. male with stage IIB colon cancer and polycythemia who is seen for 1 month assessment.   HPI: The patient was last seen in the medical oncology clinic on 01/06/2018.  At that time,  he was doing well overall.  He denied any acute concerns.  He noted feeling "funny all over his body" following small-volume therapeutic phlebotomies.  He denied post-procedural chest pain or shortness of breath.  He felt "sluggish" when his blood levels become too high.  Exam was grossly unremarkable.  Hemoglobin was 17.3, hematocrit 53.5, and platelets 129,000.  Sodium was 133.  CEA was elevated at 10.0 ng/mL (previously 7.5).   He underwent small volume phlebotomy.  Following his procedure, patient denied any issues.  He felt "normal".  Chest CT on 01/11/2018 revealed the previously seen bilateral pulmonary nodular densities had resolved.  During the interim, patient has been doing well overall. He complains of a non-productive cough. Patient denies that he has experienced any B symptoms. He denies any interval infections. Patient denies bleeding; no hematochezia, melena, or gross hematuria. He denies any unexplained bruising.  Patient advises that he maintains an adequate appetite. He is eating well. Weight today is 141 lb 8.6 oz (64.2 kg), which compared to his last visit to the clinic, represents a 1 pound increase.  Patient denies pain in the clinic today.   Past Medical History:  Diagnosis Date  . Arthritis    hands  . Colon cancer ([place])   . Emphysema of lung ([facility])    mild (per pt)  . Hypertension   . Numbness    toes and fingers (S/P chemo - per pt)    Past Surgical History:  Procedure Laterality Date  . AMPUTATION Right 2003   4th   . CATARACT EXTRACTION W/PHACO Right 02/02/2017   Procedure: CATARACT EXTRACTION PHACO AND INTRAOCULAR LENS PLACEMENT ([place]) RIGHT;  Surgeon:  Leandrew Koyanagi, MD;  Location: [facility];  Service: Ophthalmology;  Laterality: Right;  . CATARACT EXTRACTION W/PHACO Left 02/25/2017   Procedure: CATARACT EXTRACTION PHACO AND INTRAOCULAR LENS PLACEMENT ([place]) left;  Surgeon: Leandrew Koyanagi, MD;  Location: [facility];  Service: Ophthalmology;  Laterality: Left;  . COLON SURGERY  2013    Family History  Problem Relation Age of Onset  . Cancer Maternal Grandfather     Social History:  reports that he has been smoking cigarettes. He has a 22.50 pack-year smoking history. He has never used smokeless tobacco. He reports that he drinks about 2.0 standard drinks of alcohol per week. He reports that he does not use drugs.  He started smoking at age 23.  He has stopped smoking several times.  He was smoking 1pack/week.  He has cut back over the past month.  He lives in [place].  The patient has a "little sweet thing" (girlfriend since December). He is alone today.  Allergies: No Known Allergies  Current Medications: Current Outpatient Medications  Medication Sig Dispense Refill  . CELEBREX 200 MG capsule Take 200 mg by mouth daily.   0  . cyclobenzaprine (FLEXERIL) 5 MG tablet Take 1 tablet (5 mg total) by mouth 3 (three) times daily as needed for muscle spasms. 30 tablet 0  . gabapentin (NEURONTIN) 300 MG capsule Take 1 capsule by mouth daily.   0  . L-Methylfolate-Algae-B12-B6 (FOLTANX RF) 3-90.314-2-35 MG CAPS   0  . lisinopril (PRINIVIL,ZESTRIL) 20  MG tablet Take 20 mg by mouth daily.  0  . LYRICA 75 MG capsule take 1 capsule by mouth twice a day 60 capsule 3  . prednisoLONE acetate (PRED FORTE) 1 % ophthalmic suspension Place 1 drop into the left eye 2 (two) times daily.   0  . sildenafil (REVATIO) 20 MG tablet Take 2 tablets by mouth as needed.  0   No current facility-administered medications for this visit.    Facility-Administered Medications Ordered in Other Visits  Medication Dose Route Frequency  Provider Last Rate Last Dose  . sodium chloride 0.9 % injection 10 mL  10 mL Intravenous PRN Nolon Stalls C, MD   10 mL at 12/13/14 1043  . sodium chloride flush (NS) 0.9 % injection 10 mL  10 mL Intravenous PRN Nolon Stalls C, MD   10 mL at 08/20/16 1050  . sodium chloride flush (NS) 0.9 % injection 10 mL  10 mL Intravenous PRN Nolon Stalls C, MD   10 mL at 02/03/18 1020    Review of Systems  Constitutional: Positive for weight loss (up 1 pound). Negative for diaphoresis, fever and malaise/fatigue.  HENT: Negative.   Eyes: Negative.   Respiratory: Positive for cough (chronic). Negative for hemoptysis, sputum production and shortness of breath.   Cardiovascular: Negative for chest pain, palpitations, orthopnea, leg swelling and PND.  Gastrointestinal: Negative for abdominal pain, blood in stool, constipation, diarrhea, melena, nausea and vomiting.  Genitourinary: Negative for dysuria, frequency, hematuria and urgency.  Musculoskeletal: Negative for back pain, falls, joint pain and myalgias.  Skin: Negative for itching and rash.  Neurological: Positive for sensory change (stable neuropathy in hands). Negative for dizziness, tremors, weakness and headaches.  Endo/Heme/Allergies: Does not bruise/bleed easily.  Psychiatric/Behavioral: Negative for depression, memory loss and suicidal ideas. The patient is not nervous/anxious and does not have insomnia.   All other systems reviewed and are negative.  Performance status (ECOG): 0 - Asymptomatic  Vital Signs BP 115/73 (BP Location: Left Arm, Patient Position: Sitting)   Pulse 80   Temp (!) 96.7 F (35.9 C) (Tympanic)   Resp 18   Wt 141 lb 8.6 oz (64.2 kg)   BMI 18.67 kg/m   Physical Exam  Constitutional: He is oriented to person, place, and time and well-developed, well-nourished, and in no distress. No distress.  HENT:  Head: Normocephalic and atraumatic.  Mouth/Throat: Oropharynx is clear and moist and mucous membranes  are normal.  Wearing a black cap.  Albertina Parr.  Eyes: Pupils are equal, round, and reactive to light. EOM are normal. No scleral icterus.  Glasses.  Brown eyes.   Neck: Normal range of motion. Neck supple. No JVD present.  Cardiovascular: Normal rate, regular rhythm, normal heart sounds and intact distal pulses. Exam reveals no gallop and no friction rub.  No murmur heard. Pulmonary/Chest: Effort normal and breath sounds normal. No respiratory distress. He has no wheezes. He has no rales.  Abdominal: Soft. Bowel sounds are normal. He exhibits no distension and no mass. There is no tenderness. There is no rebound and no guarding.  Musculoskeletal: Normal range of motion. He exhibits no edema or tenderness.  Lymphadenopathy:    He has no cervical adenopathy.    He has no axillary adenopathy.       Right: No inguinal and no supraclavicular adenopathy present.       Left: No inguinal and no supraclavicular adenopathy present.  Neurological: He is alert and oriented to person, place, and time. Gait normal.  Skin: Skin is warm and dry. No rash noted. He is not diaphoretic. No erythema.  Psychiatric: Mood, affect and judgment normal.  Nursing note and vitals reviewed.   Infusion on 02/03/2018  Component Date Value Ref Range Status  . CEA 02/03/2018 8.6* 0.0 - 4.7 ng/mL Final   Comment: (NOTE)                             Nonsmokers          <3.9                             Smokers             <5.6 [employer] Electrochemiluminescence Immunoassay (ECLIA) Values obtained with different assay methods or kits cannot be used interchangeably.  Results cannot be interpreted as absolute evidence of the presence or absence of malignant disease. Performed At: [facility] [place], [place] [id number] Rush Farmer MD RA:[id number]   . HCT 02/03/2018 48.8  39.0 - 52.0 % Final   Performed at [facility], [address] [place], [place] [address]     Assessment:  Carly Applegate is a 67 y.o. male with stage IIB colon cancer.  He underwent right colectomy on 11/05/2012.  Pathology revealed a T3N0M0 tumor in the cecum with 17 negative lymph nodes.  Circumferential margin was positive.  There was lymphovascular invasion. He had 1 deposit of metastases in peritoneum.  Mismatch repair was positive.  He received 12 cycles of FOLFOX chemotherapy from 12/31/2012 - 08/19/2013.  Chemotherapy was interrupted because of small bowel obstruction due to adhesions in 05/2013.  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  He has had an elevated CEA with negative imaging studies.  CEA was 10.7 on 12/31/2012, 13.4 on 06/17/2013, 11.7 on 07/18/2013, 8.3 on 09/28/2013, 8.0 on 12/30/2013, 9.6 on 05/17/2014, 9.8 on 10/25/2014, 10.3 on 05/09/2015, 10.4 on 07/25/2015, 8.4 on 08/08/2015, 8.8 on 11/07/2015, 8.3 on 02/06/2016, 18.8 on 05/28/2016, 8.8 on 08/20/2016, 8.9 on 01/07/2017, 8.7 on 04/29/2017, 8.5 on 06/24/2017, 7.5 on 09/02/2017, 10.0 on 01/06/2018, and 8.6 on 02/03/2018.    Last colonoscopy was in 2017 (unknown provider).   Chest, abdomen, and pelvic CT scan on 07/03/2014 revealed no evidence of metastatic disease.  There was a new irregular shaped pleural based pulmonary nodule in the periphery of the RUL most likely infectious/inflammatory. 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    Chest CT on 10/20/2014 revealed a stable small pleural-based area of nodularity in the right upper lobe (favor benign area of scarring). There was mild diffuse bronchial wall thickening with moderate centrilobular and mild paraseptal emphysema; imaging findings suggestive of underlying COPD.  Chest CT on 07/19/2015 revealed no acute cardiopulmonary abnormalities.  He has a peripheral scar like density within the right upper lobe.   Chest, abdomen, and pelvic CT on 07/03/2016 revealed slight enlargement of the right upper lobe subpleural pulmonary nodule (3 mm  to 4.6 mm).  There was persistent irregularity of the gallbladder wall. There was a stable 12 mm right adrenal nodule.  There was mild enlargement of the prostate gland. 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  He is followed by vascular surgery.  PSA was 0.71 on 07/23/2016.  Chest CT on 01/14/2017 revealed an interval  decrease in medial right upper lobe pulmonary nodule from 5 mm on prior study to 2 mm on the current exam.  There was a 1-2 mm right lower lobe pulmonary nodule is stable since 10/20/2014 consistent with benign process.   Chest, abdomen, and pelvic CT on 07/03/2017 revealed no evidence of recurrent or metastatic carcinoma within the abdomen or pelvis.  There was a stable 1.2 cm right adrenal nodule noted. Stable 2 mm right upper and lower lobe pulmonary nodules. Several new ground glass nodules in both upper lobes, with the largest measuring 9 mm, and felt to be likely of inflammatory or infectious etiology.  Follow-up chest CT recommended in 6 months.  Chest CT on 01/11/2018 revealed the previously seen bilateral pulmonary nodular densities had resolved.  He has secondary polycythemia secondary to smoking. Work-up on 07/25/2015 revealed a hematocrit of 57.2, hemoglobin 19.2, MCV 95.1, platelets 169,000, WBC 8300 with an ANC of 4900.  Erythropoietin level was 6.0 (2.6-18.5).  Testosterone level was 680.  JAK2 was negative for V617F and exon 12.  Ferritin was 90, iron saturation 24% , and TIBC 372.    He undergoes small volume phlebotomies (250 cc) to maintain a hematocrit < 52 and a hemoglobin < 18.  With an elevated hematocrit, he feels sluggish/drowsy.  He feels better 2 days after his phlebotomies.  He underwent phlebotomy (500 cc) on 07/25/2015 with subsequent fatigue.  Last phlebotomy was on 01/06/2018.  Symptomatically, he is doing well overall.  He denies any acute  concerns.  Following his last small-volume phlebotomy on 01/06/2018, patient did not have any issues.  He notes that he felt "normal" following the procedure.  Exam grossly unremarkable.  Hematocrit 48.5.  CEA 8.6 ng/mL.  Plan: 1. Labs today:  Hematocrit, CEA. 2. Stage IIB colon cancer  Doing well overall.  No symptoms (no bleeding or weight loss).  Recheck of CEA today reveals level of 8.6 ng/mL (previously 10 ng/mL).  Patient has not been seen in follow-up by gastroenterology since his diagnosis, and resulting colectomy in 2014.  Previously followed by Dr. Candace Cruise, who is no longer with [facility].   Refer patient to Dr. Gustavo Lah for interval follow-up and management. 3. Secondary polycythemia  Hematocrit down to 48.5 today following phlebotomy on 01/06/2018.  Reviewed goal to maintain hematocrit < 52.  No small-volume phlebotomy required today. 4. Pulmonary nodules  Review chest CT. Imaging personally reviewed and felt to be consistent with the dictated radiology report. Imaging reviewed with patient.   Previously demonstrated pulmonary nodules have resolved. 5. Port maintenance  Continue routine port flushes every 6 to 8 weeks. 6. RTC monthly for labs (CBC) and +/-small-volume therapy to phlebotomy. 7. RTC in 3 months for MD assessment, labs (hematocrit), and +/-small-volume phlebotomy.   Honor Loh, NP  02/03/2018, 10:40 PM   I saw and evaluated the patient, participating in the key portions of the service and reviewing pertinent diagnostic studies and records.  I reviewed the nurse practitioner's note and agree with the findings and the plan.  The assessment and plan were discussed with the patient.  A few questions were asked by the patient and answered.   Nolon Stalls, MD 02/03/2018,10:40 PM

## 2018-02-03 NOTE — Progress Notes (Signed)
Patient offers no complaints today. 

## 2018-02-04 ENCOUNTER — Telehealth: Payer: Self-pay | Admitting: *Deleted

## 2018-02-04 LAB — CEA: CEA: 8.6 ng/mL — ABNORMAL HIGH (ref 0.0–4.7)

## 2018-02-04 NOTE — Telephone Encounter (Signed)
-----   Message from Karen Kitchens, NP sent at 02/04/2018 10:47 AM EST ----- CEA back in his normal elevated range.   Will postpone imaging until planned in 06/2018.   William Ford

## 2018-02-04 NOTE — Telephone Encounter (Signed)
Called patient to inform him that his CEA is in the elevated normal range.  MD recommends postponing imaging until April 2020.  Patient in agreement .

## 2018-03-03 ENCOUNTER — Inpatient Hospital Stay: Payer: Medicare Other | Attending: Hematology and Oncology

## 2018-03-03 ENCOUNTER — Inpatient Hospital Stay: Payer: Medicare Other

## 2018-03-03 VITALS — BP 110/72 | HR 81 | Temp 97.0°F | Resp 18

## 2018-03-03 DIAGNOSIS — C182 Malignant neoplasm of ascending colon: Secondary | ICD-10-CM | POA: Diagnosis not present

## 2018-03-03 DIAGNOSIS — Z452 Encounter for adjustment and management of vascular access device: Secondary | ICD-10-CM | POA: Diagnosis not present

## 2018-03-03 DIAGNOSIS — D751 Secondary polycythemia: Secondary | ICD-10-CM | POA: Insufficient documentation

## 2018-03-03 LAB — CBC WITH DIFFERENTIAL/PLATELET
Abs Immature Granulocytes: 0.02 10*3/uL (ref 0.00–0.07)
Basophils Absolute: 0.1 10*3/uL (ref 0.0–0.1)
Basophils Relative: 1 %
Eosinophils Absolute: 0.1 10*3/uL (ref 0.0–0.5)
Eosinophils Relative: 1 %
HCT: 52.1 % — ABNORMAL HIGH (ref 39.0–52.0)
Hemoglobin: 16.5 g/dL (ref 13.0–17.0)
Immature Granulocytes: 0 %
Lymphocytes Relative: 34 %
Lymphs Abs: 2.7 10*3/uL (ref 0.7–4.0)
MCH: 30.6 pg (ref 26.0–34.0)
MCHC: 31.7 g/dL (ref 30.0–36.0)
MCV: 96.5 fL (ref 80.0–100.0)
Monocytes Absolute: 0.5 10*3/uL (ref 0.1–1.0)
Monocytes Relative: 7 %
Neutro Abs: 4.7 10*3/uL (ref 1.7–7.7)
Neutrophils Relative %: 57 %
Platelets: 125 10*3/uL — ABNORMAL LOW (ref 150–400)
RBC: 5.4 MIL/uL (ref 4.22–5.81)
RDW: 14.3 % (ref 11.5–15.5)
WBC: 8 10*3/uL (ref 4.0–10.5)
nRBC: 0 % (ref 0.0–0.2)

## 2018-03-03 MED ORDER — SODIUM CHLORIDE 0.9% FLUSH
10.0000 mL | INTRAVENOUS | Status: DC | PRN
  Administered 2018-03-03: 10 mL via INTRAVENOUS
  Filled 2018-03-03: qty 10

## 2018-03-03 MED ORDER — HEPARIN SOD (PORK) LOCK FLUSH 100 UNIT/ML IV SOLN
500.0000 [IU] | Freq: Once | INTRAVENOUS | Status: AC
  Administered 2018-03-03: 500 [IU] via INTRAVENOUS

## 2018-03-03 NOTE — Patient Instructions (Signed)

## 2018-03-29 ENCOUNTER — Encounter: Payer: Self-pay | Admitting: Hematology and Oncology

## 2018-04-01 ENCOUNTER — Inpatient Hospital Stay: Payer: Medicare Other | Attending: Hematology and Oncology

## 2018-04-01 DIAGNOSIS — Z452 Encounter for adjustment and management of vascular access device: Secondary | ICD-10-CM | POA: Diagnosis not present

## 2018-04-01 DIAGNOSIS — C182 Malignant neoplasm of ascending colon: Secondary | ICD-10-CM

## 2018-04-01 DIAGNOSIS — D751 Secondary polycythemia: Secondary | ICD-10-CM | POA: Insufficient documentation

## 2018-04-01 DIAGNOSIS — Z85038 Personal history of other malignant neoplasm of large intestine: Secondary | ICD-10-CM | POA: Diagnosis not present

## 2018-04-01 LAB — CBC WITH DIFFERENTIAL/PLATELET
Abs Immature Granulocytes: 0.01 10*3/uL (ref 0.00–0.07)
Basophils Absolute: 0.1 10*3/uL (ref 0.0–0.1)
Basophils Relative: 1 %
Eosinophils Absolute: 0 10*3/uL (ref 0.0–0.5)
Eosinophils Relative: 1 %
HCT: 49.8 % (ref 39.0–52.0)
Hemoglobin: 16.2 g/dL (ref 13.0–17.0)
Immature Granulocytes: 0 %
Lymphocytes Relative: 30 %
Lymphs Abs: 1.9 10*3/uL (ref 0.7–4.0)
MCH: 30.4 pg (ref 26.0–34.0)
MCHC: 32.5 g/dL (ref 30.0–36.0)
MCV: 93.4 fL (ref 80.0–100.0)
Monocytes Absolute: 0.6 10*3/uL (ref 0.1–1.0)
Monocytes Relative: 11 %
Neutro Abs: 3.5 10*3/uL (ref 1.7–7.7)
Neutrophils Relative %: 57 %
Platelets: 141 10*3/uL — ABNORMAL LOW (ref 150–400)
RBC: 5.33 MIL/uL (ref 4.22–5.81)
RDW: 13.7 % (ref 11.5–15.5)
WBC: 6.1 10*3/uL (ref 4.0–10.5)
nRBC: 0 % (ref 0.0–0.2)

## 2018-04-01 MED ORDER — SODIUM CHLORIDE 0.9% FLUSH
10.0000 mL | INTRAVENOUS | Status: DC | PRN
  Administered 2018-04-01: 10 mL via INTRAVENOUS
  Filled 2018-04-01: qty 10

## 2018-04-01 MED ORDER — HEPARIN SOD (PORK) LOCK FLUSH 100 UNIT/ML IV SOLN
500.0000 [IU] | Freq: Once | INTRAVENOUS | Status: AC
  Administered 2018-04-01: 500 [IU] via INTRAVENOUS

## 2018-04-01 MED ORDER — HEPARIN SOD (PORK) LOCK FLUSH 100 UNIT/ML IV SOLN
INTRAVENOUS | Status: AC
  Filled 2018-04-01: qty 5

## 2018-04-28 ENCOUNTER — Inpatient Hospital Stay (HOSPITAL_BASED_OUTPATIENT_CLINIC_OR_DEPARTMENT_OTHER): Payer: Medicare Other | Admitting: Hematology and Oncology

## 2018-04-28 ENCOUNTER — Other Ambulatory Visit: Payer: Self-pay | Admitting: Hematology and Oncology

## 2018-04-28 ENCOUNTER — Inpatient Hospital Stay: Payer: Medicare Other

## 2018-04-28 ENCOUNTER — Other Ambulatory Visit: Payer: Medicare Other

## 2018-04-28 ENCOUNTER — Encounter: Payer: Self-pay | Admitting: Hematology and Oncology

## 2018-04-28 ENCOUNTER — Ambulatory Visit: Payer: Medicare Other | Admitting: Hematology and Oncology

## 2018-04-28 VITALS — BP 139/75 | HR 73 | Temp 97.9°F | Resp 18 | Ht 73.0 in | Wt 137.2 lb

## 2018-04-28 DIAGNOSIS — C182 Malignant neoplasm of ascending colon: Secondary | ICD-10-CM

## 2018-04-28 DIAGNOSIS — Z452 Encounter for adjustment and management of vascular access device: Secondary | ICD-10-CM | POA: Diagnosis not present

## 2018-04-28 DIAGNOSIS — D751 Secondary polycythemia: Secondary | ICD-10-CM | POA: Diagnosis present

## 2018-04-28 DIAGNOSIS — Z85038 Personal history of other malignant neoplasm of large intestine: Secondary | ICD-10-CM

## 2018-04-28 LAB — CBC WITH DIFFERENTIAL/PLATELET
Abs Immature Granulocytes: 0.01 10*3/uL (ref 0.00–0.07)
Basophils Absolute: 0.1 10*3/uL (ref 0.0–0.1)
Basophils Relative: 1 %
Eosinophils Absolute: 0 10*3/uL (ref 0.0–0.5)
Eosinophils Relative: 1 %
HCT: 49.6 % (ref 39.0–52.0)
Hemoglobin: 16.1 g/dL (ref 13.0–17.0)
Immature Granulocytes: 0 %
Lymphocytes Relative: 28 %
Lymphs Abs: 1.6 10*3/uL (ref 0.7–4.0)
MCH: 30.5 pg (ref 26.0–34.0)
MCHC: 32.5 g/dL (ref 30.0–36.0)
MCV: 93.9 fL (ref 80.0–100.0)
Monocytes Absolute: 0.4 10*3/uL (ref 0.1–1.0)
Monocytes Relative: 6 %
Neutro Abs: 3.7 10*3/uL (ref 1.7–7.7)
Neutrophils Relative %: 64 %
Platelets: 195 10*3/uL (ref 150–400)
RBC: 5.28 MIL/uL (ref 4.22–5.81)
RDW: 13.7 % (ref 11.5–15.5)
WBC: 5.7 10*3/uL (ref 4.0–10.5)
nRBC: 0 % (ref 0.0–0.2)

## 2018-04-28 LAB — COMPREHENSIVE METABOLIC PANEL
ALT: 13 U/L (ref 0–44)
AST: 22 U/L (ref 15–41)
Albumin: 4 g/dL (ref 3.5–5.0)
Alkaline Phosphatase: 90 U/L (ref 38–126)
Anion gap: 6 (ref 5–15)
BUN: 5 mg/dL — ABNORMAL LOW (ref 8–23)
CO2: 25 mmol/L (ref 22–32)
Calcium: 8.9 mg/dL (ref 8.9–10.3)
Chloride: 104 mmol/L (ref 98–111)
Creatinine, Ser: 1.03 mg/dL (ref 0.61–1.24)
GFR calc Af Amer: >60 mL/min (ref >60)
GFR calc non Af Amer: >60 mL/min (ref >60)
Glucose, Bld: 120 mg/dL — ABNORMAL HIGH (ref 70–99)
Potassium: 3.4 mmol/L — ABNORMAL LOW (ref 3.5–5.1)
Sodium: 135 mmol/L (ref 135–145)
Total Bilirubin: 0.6 mg/dL (ref 0.3–1.2)
Total Protein: 7.6 g/dL (ref 6.5–8.1)

## 2018-04-28 MED ORDER — HEPARIN SOD (PORK) LOCK FLUSH 100 UNIT/ML IV SOLN
500.0000 [IU] | Freq: Once | INTRAVENOUS | Status: AC
  Administered 2018-04-28: 500 [IU] via INTRAVENOUS

## 2018-04-28 MED ORDER — SODIUM CHLORIDE 0.9% FLUSH
10.0000 mL | INTRAVENOUS | Status: DC | PRN
  Administered 2018-04-28: 10 mL via INTRAVENOUS
  Filled 2018-04-28: qty 10

## 2018-04-28 NOTE — Progress Notes (Signed)
[place] Clinic day:  04/28/2018   Chief Complaint: William Ford is a 68 y.o. male with stage IIB colon cancer and polycythemia who is seen for 3 month assessment.   HPI: The patient was last seen in the medical oncology clinic on 02/03/2018.  At that time,  he was doing well overall.  He denies any acute concerns.  Following his last small-volume phlebotomy on 01/06/2018, patient did not have any issues.  He felt "normal" following the procedure.  Exam was grossly unremarkable.  Hematocrit was 48.5.  CEA was 8.6 ng/mL.  Follow-up with GI was recommended.  Patient scheduled to follow up with gastroenterology on 05/11/2018. He is unsure who he will be seeing.   Symptomatically, patient is going well overall. Patient describes some abdominal pain x 1 week following "eating some stuff like spaghetti last week". Stomach felt "weak" and he was nauseated afterwards. Symptoms have resolved at this point. He has had no recurrent nausea, vomiting, or changes to his bowel habits. Patient denies bleeding; no hematochezia, melena, or gross hematuria. Patient denies that he has experienced any B symptoms. He denies any interval infections.   Patient continues to smoke on a daily basis.  Patient advises that he maintains an adequate appetite. He is eating well. Weight today is 137 lb 3.8 oz (62.2 kg), which compared to his last visit to the clinic, represents a 4 pound decrease.    Patient denies pain in the clinic today.   Past Medical History:  Diagnosis Date  . Arthritis    hands  . Colon cancer ([place])   . Emphysema of lung ([facility])    mild (per pt)  . Hypertension   . Numbness    toes and fingers (S/P chemo - per pt)    Past Surgical History:  Procedure Laterality Date  . AMPUTATION Right 2003   4th   . CATARACT EXTRACTION W/PHACO Right 02/02/2017   Procedure: CATARACT EXTRACTION PHACO AND INTRAOCULAR LENS PLACEMENT ([place]) RIGHT;  Surgeon: Leandrew Koyanagi, MD;  Location: [facility];  Service: Ophthalmology;  Laterality: Right;  . CATARACT EXTRACTION W/PHACO Left 02/25/2017   Procedure: CATARACT EXTRACTION PHACO AND INTRAOCULAR LENS PLACEMENT ([place]) left;  Surgeon: Leandrew Koyanagi, MD;  Location: [facility];  Service: Ophthalmology;  Laterality: Left;  . COLON SURGERY  2013    Family History  Problem Relation Age of Onset  . Cancer Maternal Grandfather     Social History:  reports that he has been smoking cigarettes. He has a 22.50 pack-year smoking history. He has never used smokeless tobacco. He reports current alcohol use of about 2.0 standard drinks of alcohol per week. He reports that he does not use drugs.  He started smoking at age 70.  He has stopped smoking several times.  He was smoking 1pack/week.  He has cut back over the past month.  He lives in [place].  The patient has a "little sweet thing" (girlfriend since December). He is alone today.  Allergies: No Known Allergies  Current Medications: Current Outpatient Medications  Medication Sig Dispense Refill  . CELEBREX 200 MG capsule Take 200 mg by mouth daily.   0  . gabapentin (NEURONTIN) 300 MG capsule Take 1 capsule by mouth daily.   0  . L-Methylfolate-Algae-B12-B6 (FOLTANX RF) 3-90.314-2-35 MG CAPS   0  . lisinopril (PRINIVIL,ZESTRIL) 20 MG tablet Take 20 mg by mouth daily.  0  . LYRICA 75 MG capsule take 1 capsule by  mouth twice a day 60 capsule 3  . prednisoLONE acetate (PRED FORTE) 1 % ophthalmic suspension Place 1 drop into the left eye 2 (two) times daily.   0  . cyclobenzaprine (FLEXERIL) 5 MG tablet Take 1 tablet (5 mg total) by mouth 3 (three) times daily as needed for muscle spasms. (Patient not taking: Reported on 04/28/2018) 30 tablet 0  . sildenafil (REVATIO) 20 MG tablet Take 2 tablets by mouth as needed.  0   No current facility-administered medications for this visit.    Facility-Administered Medications Ordered in Other  Visits  Medication Dose Route Frequency Provider Last Rate Last Dose  . sodium chloride 0.9 % injection 10 mL  10 mL Intravenous PRN Nolon Stalls C, MD   10 mL at 12/13/14 1043  . sodium chloride flush (NS) 0.9 % injection 10 mL  10 mL Intravenous PRN Nolon Stalls C, MD   10 mL at 08/20/16 1050  . sodium chloride flush (NS) 0.9 % injection 10 mL  10 mL Intravenous PRN Nolon Stalls C, MD   10 mL at 02/03/18 1020    Review of Systems  Constitutional: Positive for weight loss (4 pounds). Negative for chills, diaphoresis, fever and malaise/fatigue.       "So far, so good".  HENT: Negative.  Negative for congestion, ear pain, nosebleeds, sinus pain and sore throat.   Eyes: Negative.  Negative for blurred vision, double vision, photophobia and pain.  Respiratory: Positive for cough (chronic). Negative for hemoptysis, sputum production and shortness of breath.        Smoking.  Cardiovascular: Negative.  Negative for chest pain, palpitations, orthopnea, leg swelling and PND.  Gastrointestinal: Negative for abdominal pain, blood in stool, constipation, diarrhea, melena, nausea and vomiting.       Interval abdominal discomfort, resolved.  Genitourinary: Negative.  Negative for dysuria, frequency, hematuria and urgency.  Musculoskeletal: Negative.  Negative for back pain, falls, joint pain, myalgias and neck pain.  Skin: Negative.  Negative for itching and rash.  Neurological: Positive for sensory change (stable neuropathy in hands). Negative for dizziness, tremors, speech change, weakness and headaches.  Endo/Heme/Allergies: Negative.  Does not bruise/bleed easily.  Psychiatric/Behavioral: Negative for depression and memory loss. The patient is not nervous/anxious and does not have insomnia.   All other systems reviewed and are negative.  Performance status (ECOG): 0  Vital Signs BP 139/75 (BP Location: Right Arm, Patient Position: Sitting)   Pulse 73   Temp 97.9 F (36.6 C)  (Tympanic)   Resp 18   Ht 6' 1"  (1.854 m)   Wt 137 lb 3.8 oz (62.2 kg)   SpO2 99%   BMI 18.11 kg/m   Physical Exam  Constitutional: He is oriented to person, place, and time and well-developed, well-nourished, and in no distress. No distress.  HENT:  Head: Normocephalic and atraumatic.  Mouth/Throat: Oropharynx is clear and moist and mucous membranes are normal. No oropharyngeal exudate.  Wearing a black glof cap.  Albertina Parr.  Eyes: Pupils are equal, round, and reactive to light. Conjunctivae and EOM are normal. No scleral icterus.  Glasses.  Brown eyes.   Neck: Normal range of motion. Neck supple. No JVD present.  Cardiovascular: Normal rate, regular rhythm, normal heart sounds and intact distal pulses. Exam reveals no gallop and no friction rub.  No murmur heard. Pulmonary/Chest: Effort normal and breath sounds normal. No respiratory distress. He has no wheezes. He has no rales.  Abdominal: Soft. Bowel sounds are normal. He exhibits no  distension and no mass. There is no abdominal tenderness. There is no rebound and no guarding.  Musculoskeletal: Normal range of motion.        General: No tenderness or edema.  Lymphadenopathy:    He has no cervical adenopathy.    He has no axillary adenopathy.       Right: No supraclavicular adenopathy present.       Left: No supraclavicular adenopathy present.  Neurological: He is alert and oriented to person, place, and time. Gait normal.  Skin: Skin is warm and dry. No rash noted. He is not diaphoretic. No erythema. No pallor.  Psychiatric: Mood, affect and judgment normal.  Nursing note and vitals reviewed.   Infusion on 04/28/2018  Component Date Value Ref Range Status  . WBC 04/28/2018 5.7  4.0 - 10.5 K/uL Final  . RBC 04/28/2018 5.28  4.22 - 5.81 MIL/uL Final  . Hemoglobin 04/28/2018 16.1  13.0 - 17.0 g/dL Final  . HCT 04/28/2018 49.6  39.0 - 52.0 % Final  . MCV 04/28/2018 93.9  80.0 - 100.0 fL Final  . MCH 04/28/2018 30.5  26.0  - 34.0 pg Final  . MCHC 04/28/2018 32.5  30.0 - 36.0 g/dL Final  . RDW 04/28/2018 13.7  11.5 - 15.5 % Final  . Platelets 04/28/2018 195  150 - 400 K/uL Final   Comment: Immature Platelet Fraction may be clinically indicated, consider ordering this additional test [id number]   . nRBC 04/28/2018 0.0  0.0 - 0.2 % Final  . Neutrophils Relative % 04/28/2018 64  % Final  . Neutro Abs 04/28/2018 3.7  1.7 - 7.7 K/uL Final  . Lymphocytes Relative 04/28/2018 28  % Final  . Lymphs Abs 04/28/2018 1.6  0.7 - 4.0 K/uL Final  . Monocytes Relative 04/28/2018 6  % Final  . Monocytes Absolute 04/28/2018 0.4  0.1 - 1.0 K/uL Final  . Eosinophils Relative 04/28/2018 1  % Final  . Eosinophils Absolute 04/28/2018 0.0  0.0 - 0.5 K/uL Final  . Basophils Relative 04/28/2018 1  % Final  . Basophils Absolute 04/28/2018 0.1  0.0 - 0.1 K/uL Final  . Immature Granulocytes 04/28/2018 0  % Final  . Abs Immature Granulocytes 04/28/2018 0.01  0.00 - 0.07 K/uL Final   Performed at [facility], [address], [place], [place] [address]  . Sodium 04/28/2018 135  135 - 145 mmol/L Final  . Potassium 04/28/2018 3.4* 3.5 - 5.1 mmol/L Final  . Chloride 04/28/2018 104  98 - 111 mmol/L Final  . CO2 04/28/2018 25  22 - 32 mmol/L Final  . Glucose, Bld 04/28/2018 120* 70 - 99 mg/dL Final  . BUN 04/28/2018 5* 8 - 23 mg/dL Final  . Creatinine, Ser 04/28/2018 1.03  0.61 - 1.24 mg/dL Final  . Calcium 04/28/2018 8.9  8.9 - 10.3 mg/dL Final  . Total Protein 04/28/2018 7.6  6.5 - 8.1 g/dL Final  . Albumin 04/28/2018 4.0  3.5 - 5.0 g/dL Final  . AST 04/28/2018 22  15 - 41 U/L Final  . ALT 04/28/2018 13  0 - 44 U/L Final  . Alkaline Phosphatase 04/28/2018 90  38 - 126 U/L Final  . Total Bilirubin 04/28/2018 0.6  0.3 - 1.2 mg/dL Final  . GFR calc non Af Amer 04/28/2018 >60  >60 mL/min Final  . GFR calc Af Amer 04/28/2018 >60  >60 mL/min Final  . Anion gap 04/28/2018 6  5 - 15 Final   Performed at [facility], [address] [place], [place] [address]    Assessment:  William Ford is a 68 y.o. male with stage IIB colon cancer.  He underwent right colectomy on 11/05/2012.  Pathology revealed a T3N0M0 tumor in the cecum with 17 negative lymph nodes.  Circumferential margin was positive.  There was lymphovascular invasion. He had 1 deposit of metastases in peritoneum.  Mismatch repair was positive.  He received 12 cycles of FOLFOX chemotherapy from 12/31/2012 - 08/19/2013.  Chemotherapy was interrupted because of small bowel obstruction due to adhesions in 05/2013.  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  He has had an elevated CEA with negative imaging studies.  CEA was 10.7 on 12/31/2012, 13.4 on 06/17/2013, 11.7 on 07/18/2013, 8.3 on 09/28/2013, 8.0 on 12/30/2013, 9.6 on 05/17/2014, 9.8 on 10/25/2014, 10.3 on 05/09/2015, 10.4 on 07/25/2015, 8.4 on 08/08/2015, 8.8 on 11/07/2015, 8.3 on 02/06/2016, 18.8 on 05/28/2016, 8.8 on 08/20/2016, 8.9 on 01/07/2017, 8.7 on 04/29/2017, 8.5 on 06/24/2017, 7.5 on 09/02/2017, 10.0 on 01/06/2018, 8.6 on 02/03/2018, and 8.2 on 04/28/2018.    Last colonoscopy was in 2017 (unknown provider).   Chest, abdomen, and pelvic CT scan on 07/03/2014 revealed no evidence of metastatic disease.  There was a new irregular shaped pleural based pulmonary nodule in the periphery of the RUL most likely infectious/inflammatory. 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    Chest CT on 10/20/2014 revealed a stable small pleural-based area of nodularity in the right upper lobe (favor benign area of scarring). There was mild diffuse bronchial wall thickening with moderate centrilobular and mild paraseptal emphysema; imaging findings suggestive of underlying COPD.  Chest CT on 07/19/2015 revealed no acute cardiopulmonary abnormalities.  He has a peripheral scar like density within the right upper lobe.   Chest, abdomen, and pelvic CT on 07/03/2016 revealed slight  enlargement of the right upper lobe subpleural pulmonary nodule (3 mm to 4.6 mm).  There was persistent irregularity of the gallbladder wall. There was a stable 12 mm right adrenal nodule.  There was mild enlargement of the prostate gland. 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  He is followed by vascular surgery.  PSA was 0.71 on 07/23/2016.  Chest CT on 01/14/2017 revealed an interval decrease in medial right upper lobe pulmonary nodule from 5 mm on prior study to 2 mm on the current exam.  There was a 1-2 mm right lower lobe pulmonary nodule is stable since 10/20/2014 consistent with benign process.   Chest, abdomen, and pelvic CT on 07/03/2017 revealed no evidence of recurrent or metastatic carcinoma within the abdomen or pelvis.  There was a stable 1.2 cm right adrenal nodule noted. Stable 2 mm right upper and lower lobe pulmonary nodules. Several new ground glass nodules in both upper lobes, with the largest measuring 9 mm, and felt to be likely of inflammatory or infectious etiology.  Follow-up chest CT recommended in 6 months.  Chest CT on 01/11/2018 revealed the previously seen bilateral pulmonary nodular densities had resolved.  He has secondary polycythemia secondary to smoking. Work-up on 07/25/2015 revealed a hematocrit of 57.2, hemoglobin 19.2, MCV 95.1, platelets 169,000, WBC 8300 with an ANC of 4900.  Erythropoietin level was 6.0 (2.6-18.5).  Testosterone level was 680.  JAK2 was negative for V617F and exon 12.  Ferritin was 90, iron saturation 24% , and TIBC 372.    He undergoes small volume  phlebotomies (250 cc) to maintain a hematocrit < 52 and a hemoglobin < 18.  With an elevated hematocrit, he feels sluggish/drowsy.  He feels better 2 days after his phlebotomies.  He underwent phlebotomy (500 cc) on 07/25/2015 with subsequent fatigue.  Last phlebotomy was on  01/06/2018.  Symptomatically, he is doing well.  He described transient GI symptoms after eating a large bowl of spaghetti.  He continues to smoke.  Exam is unremarkable.  Hemoglobin is 16.1.    Plan: 1. Labs today:  CBC with diff, CMP, CEA. 2. Stage IIB colon cancer Clinically doing well. CEA 8.2 (stable to improved). Patient scheduled to see GI on 05/12/2018. Schedule chest, abdomen, and pelvic CT on 07/04/2018. 3. Secondary polycythemia Hematocrit 49.6.  Hemoglobin 16.1. Hematocrit goal < 52. No phlebotomy today. Encourage smoking cessation. 4. Port maintenance Continue port flushes every 6-8 weeks. Discuss plan for port-a-cath removal. 5. RTC monthly for labs (CBC) and +/- small-volume phlebotomy 6. RTC in 3 months for MD assessment, labs (CBC) and +/- phlebotomy.   Honor Loh, NP  04/28/2018, 10:41 AM   I saw and evaluated the patient, participating in the key portions of the service and reviewing pertinent diagnostic studies and records.  I reviewed the nurse practitioner's note and agree with the findings and the plan.  The assessment and plan were discussed with the patient.  Several questions were asked by the patient and answered.   Nolon Stalls, MD 04/28/2018,10:41 AM

## 2018-04-28 NOTE — Progress Notes (Signed)
No new changes noted today 

## 2018-04-29 LAB — CEA: CEA: 8.2 ng/mL — ABNORMAL HIGH (ref 0.0–4.7)

## 2018-05-27 ENCOUNTER — Inpatient Hospital Stay: Payer: Medicare Other | Attending: Hematology and Oncology

## 2018-05-27 ENCOUNTER — Inpatient Hospital Stay: Payer: Medicare Other

## 2018-06-16 ENCOUNTER — Other Ambulatory Visit: Payer: Self-pay

## 2018-06-16 ENCOUNTER — Inpatient Hospital Stay: Payer: Medicare Other

## 2018-06-16 ENCOUNTER — Inpatient Hospital Stay: Payer: Medicare Other | Attending: Hematology and Oncology

## 2018-06-16 DIAGNOSIS — D751 Secondary polycythemia: Secondary | ICD-10-CM | POA: Diagnosis not present

## 2018-06-16 DIAGNOSIS — C182 Malignant neoplasm of ascending colon: Secondary | ICD-10-CM

## 2018-06-16 LAB — CBC
HCT: 53 % — ABNORMAL HIGH (ref 39.0–52.0)
Hemoglobin: 16.8 g/dL (ref 13.0–17.0)
MCH: 29.7 pg (ref 26.0–34.0)
MCHC: 31.7 g/dL (ref 30.0–36.0)
MCV: 93.6 fL (ref 80.0–100.0)
Platelets: 194 10*3/uL (ref 150–400)
RBC: 5.66 MIL/uL (ref 4.22–5.81)
RDW: 14.4 % (ref 11.5–15.5)
WBC: 6.1 10*3/uL (ref 4.0–10.5)
nRBC: 0 % (ref 0.0–0.2)

## 2018-06-16 MED ORDER — SODIUM CHLORIDE 0.9% FLUSH
10.0000 mL | INTRAVENOUS | Status: DC | PRN
  Filled 2018-06-16: qty 10

## 2018-06-16 MED ORDER — HEPARIN SOD (PORK) LOCK FLUSH 100 UNIT/ML IV SOLN: 500.0000 [IU] | Freq: Once | INTRAVENOUS | Status: DC

## 2018-06-16 NOTE — Patient Instructions (Signed)

## 2018-06-24 ENCOUNTER — Other Ambulatory Visit: Payer: Medicare Other

## 2018-07-01 ENCOUNTER — Other Ambulatory Visit: Payer: Self-pay

## 2018-07-01 DIAGNOSIS — C182 Malignant neoplasm of ascending colon: Secondary | ICD-10-CM

## 2018-07-06 ENCOUNTER — Other Ambulatory Visit
Admission: RE | Admit: 2018-07-06 | Discharge: 2018-07-06 | Disposition: A | Discharge status: Home / Self Care | Payer: Medicare Other | Source: Home / Self Care | Attending: Hematology and Oncology | Admitting: Hematology and Oncology

## 2018-07-06 ENCOUNTER — Other Ambulatory Visit: Payer: Self-pay

## 2018-07-06 ENCOUNTER — Ambulatory Visit
Admission: RE | Admit: 2018-07-06 | Discharge: 2018-07-06 | Disposition: A | Discharge status: Home / Self Care | Payer: Medicare Other | Source: Ambulatory Visit | Attending: Urgent Care | Admitting: Urgent Care

## 2018-07-06 DIAGNOSIS — C182 Malignant neoplasm of ascending colon: Secondary | ICD-10-CM

## 2018-07-06 LAB — BASIC METABOLIC PANEL
Anion gap: 6 (ref 5–15)
BUN: 6 mg/dL — ABNORMAL LOW (ref 8–23)
CO2: 27 mmol/L (ref 22–32)
Calcium: 8.9 mg/dL (ref 8.9–10.3)
Chloride: 102 mmol/L (ref 98–111)
Creatinine, Ser: 1.01 mg/dL (ref 0.61–1.24)
GFR calc Af Amer: >60 mL/min (ref >60)
GFR calc non Af Amer: >60 mL/min (ref >60)
Glucose, Bld: 100 mg/dL — ABNORMAL HIGH (ref 70–99)
Potassium: 4.9 mmol/L (ref 3.5–5.1)
Sodium: 135 mmol/L (ref 135–145)

## 2018-07-06 MED ORDER — IOHEXOL 300 MG/ML  SOLN
100.0000 mL | Freq: Once | INTRAMUSCULAR | Status: AC | PRN
  Administered 2018-07-06: 11:00:00 100 mL via INTRAVENOUS

## 2018-07-15 ENCOUNTER — Encounter: Payer: Self-pay | Admitting: *Deleted

## 2018-07-16 ENCOUNTER — Encounter: Payer: Self-pay | Admitting: *Deleted

## 2018-07-16 ENCOUNTER — Ambulatory Visit: Payer: Medicare Other | Admitting: Anesthesiology

## 2018-07-16 ENCOUNTER — Other Ambulatory Visit: Payer: Self-pay

## 2018-07-16 ENCOUNTER — Encounter
Admission: RE | Disposition: A | Discharge status: Home / Self Care | Payer: Self-pay | Source: Home / Self Care | Attending: Gastroenterology

## 2018-07-16 ENCOUNTER — Ambulatory Visit
Admission: RE | Admit: 2018-07-16 | Discharge: 2018-07-16 | Disposition: A | Discharge status: Home / Self Care | Payer: Medicare Other | Attending: Gastroenterology | Admitting: Gastroenterology

## 2018-07-16 DIAGNOSIS — Z9221 Personal history of antineoplastic chemotherapy: Secondary | ICD-10-CM | POA: Diagnosis not present

## 2018-07-16 DIAGNOSIS — K5730 Diverticulosis of large intestine without perforation or abscess without bleeding: Secondary | ICD-10-CM | POA: Insufficient documentation

## 2018-07-16 DIAGNOSIS — Z681 Body mass index (BMI) 19 or less, adult: Secondary | ICD-10-CM | POA: Diagnosis not present

## 2018-07-16 DIAGNOSIS — J439 Emphysema, unspecified: Secondary | ICD-10-CM | POA: Diagnosis not present

## 2018-07-16 DIAGNOSIS — M1990 Unspecified osteoarthritis, unspecified site: Secondary | ICD-10-CM | POA: Insufficient documentation

## 2018-07-16 DIAGNOSIS — Z1211 Encounter for screening for malignant neoplasm of colon: Secondary | ICD-10-CM | POA: Diagnosis not present

## 2018-07-16 DIAGNOSIS — D123 Benign neoplasm of transverse colon: Secondary | ICD-10-CM | POA: Insufficient documentation

## 2018-07-16 DIAGNOSIS — R634 Abnormal weight loss: Secondary | ICD-10-CM | POA: Insufficient documentation

## 2018-07-16 DIAGNOSIS — I10 Essential (primary) hypertension: Secondary | ICD-10-CM | POA: Diagnosis not present

## 2018-07-16 DIAGNOSIS — K2210 Ulcer of esophagus without bleeding: Secondary | ICD-10-CM | POA: Insufficient documentation

## 2018-07-16 DIAGNOSIS — K2960 Other gastritis without bleeding: Secondary | ICD-10-CM | POA: Insufficient documentation

## 2018-07-16 DIAGNOSIS — K210 Gastro-esophageal reflux disease with esophagitis: Secondary | ICD-10-CM | POA: Diagnosis not present

## 2018-07-16 DIAGNOSIS — K2940 Chronic atrophic gastritis without bleeding: Secondary | ICD-10-CM | POA: Diagnosis not present

## 2018-07-16 DIAGNOSIS — Z980 Intestinal bypass and anastomosis status: Secondary | ICD-10-CM | POA: Diagnosis not present

## 2018-07-16 DIAGNOSIS — K635 Polyp of colon: Secondary | ICD-10-CM | POA: Insufficient documentation

## 2018-07-16 DIAGNOSIS — D124 Benign neoplasm of descending colon: Secondary | ICD-10-CM | POA: Diagnosis not present

## 2018-07-16 DIAGNOSIS — K640 First degree hemorrhoids: Secondary | ICD-10-CM | POA: Diagnosis not present

## 2018-07-16 DIAGNOSIS — Z791 Long term (current) use of non-steroidal anti-inflammatories (NSAID): Secondary | ICD-10-CM | POA: Insufficient documentation

## 2018-07-16 DIAGNOSIS — K621 Rectal polyp: Secondary | ICD-10-CM | POA: Insufficient documentation

## 2018-07-16 DIAGNOSIS — Z85038 Personal history of other malignant neoplasm of large intestine: Secondary | ICD-10-CM | POA: Diagnosis not present

## 2018-07-16 DIAGNOSIS — Z79899 Other long term (current) drug therapy: Secondary | ICD-10-CM | POA: Diagnosis not present

## 2018-07-16 DIAGNOSIS — B3781 Candidal esophagitis: Secondary | ICD-10-CM | POA: Insufficient documentation

## 2018-07-16 HISTORY — PX: COLONOSCOPY WITH PROPOFOL: SHX5780

## 2018-07-16 HISTORY — PX: ESOPHAGOGASTRODUODENOSCOPY (EGD) WITH PROPOFOL: SHX5813

## 2018-07-16 LAB — KOH PREP

## 2018-07-16 SURGERY — COLONOSCOPY WITH PROPOFOL
Anesthesia: General

## 2018-07-16 MED ORDER — PROPOFOL 500 MG/50ML IV EMUL
INTRAVENOUS | Status: DC | PRN
  Administered 2018-07-16: 150 ug/kg/min via INTRAVENOUS

## 2018-07-16 MED ORDER — SODIUM CHLORIDE 0.9 % IV SOLN
INTRAVENOUS | Status: DC
  Administered 2018-07-16: 07:00:00 via INTRAVENOUS

## 2018-07-16 MED ORDER — PHENYLEPHRINE HCL (PRESSORS) 10 MG/ML IV SOLN
INTRAVENOUS | Status: DC | PRN
  Administered 2018-07-16 (×5): 200 ug via INTRAVENOUS

## 2018-07-16 MED ORDER — GLYCOPYRROLATE 0.2 MG/ML IJ SOLN
INTRAMUSCULAR | Status: DC | PRN
  Administered 2018-07-16: 0.2 mg via INTRAVENOUS

## 2018-07-16 MED ORDER — LIDOCAINE HCL (CARDIAC) PF 100 MG/5ML IV SOSY
PREFILLED_SYRINGE | INTRAVENOUS | Status: DC | PRN
  Administered 2018-07-16: 100 mg via INTRAVENOUS

## 2018-07-16 MED ORDER — GLYCOPYRROLATE 0.2 MG/ML IJ SOLN
INTRAMUSCULAR | Status: AC
  Filled 2018-07-16: qty 1

## 2018-07-16 MED ORDER — SODIUM CHLORIDE 0.9 % IV SOLN: INTRAVENOUS | Status: DC

## 2018-07-16 MED ORDER — LIDOCAINE HCL (PF) 2 % IJ SOLN
INTRAMUSCULAR | Status: AC
  Filled 2018-07-16: qty 10

## 2018-07-16 MED ORDER — PROPOFOL 10 MG/ML IV BOLUS
INTRAVENOUS | Status: DC | PRN
  Administered 2018-07-16: 70 mg via INTRAVENOUS

## 2018-07-16 MED ORDER — PROPOFOL 500 MG/50ML IV EMUL
INTRAVENOUS | Status: AC
  Filled 2018-07-16: qty 50

## 2018-07-16 NOTE — Op Note (Signed)
[facility] Gastroenterology Patient Name: William Ford Procedure Date: 07/16/2018 7:13 AM MRN: [id number] Account #: [id number] Date of Birth: November 27, 1950 Admit Type: Outpatient Age: 68 Room: [facility] ENDO ROOM 4 Gender: Male Note Status: Finalized Procedure:            Colonoscopy Indications:          Personal history of malignant neoplasm of the colon Providers:            Lollie Sails, MD Referring MD:         Venetia Maxon. Elijio Miles, MD (Referring MD) Medicines:            Monitored Anesthesia Care Complications:        No immediate complications. Procedure:            Pre-Anesthesia Assessment:                       - ASA Grade Assessment: III - A patient with severe                        systemic disease.                       After obtaining informed consent, the colonoscope was                        passed under direct vision. Throughout the procedure,                        the patient's blood pressure, pulse, and oxygen                        saturations were monitored continuously. The                        Colonoscope was introduced through the anus and                        advanced to the the ileocolonic anastomosis. The                        colonoscopy was performed without difficulty. The                        patient tolerated the procedure well. The quality of                        the bowel preparation was fair. Findings:      There was evidence of a prior functional end-to-end ileo-colonic       anastomosis in the proximal transverse colon. This was patent and was       characterized by healthy appearing mucosa.      There was evidence of a prior functional end-to-end colo-colonic       anastomosis at the splenic flexure. This was patent and was       characterized by healthy appearing mucosa. The anastomosis was traversed.      Multiple small-mouthed diverticula were found in the sigmoid colon,       descending colon and transverse  colon.      A 5 mm polyp was found in the proximal transverse colon. The polyp was  sessile. The polyp was removed with a cold snare. Resection and       retrieval were complete.      A 3 mm polyp was found in the descending colon. The polyp was sessile.       The polyp was removed with a cold biopsy forceps. Resection and       retrieval were complete.      Two sessile polyps were found in the sigmoid colon. The polyps were 2 to       3 mm in size. These polyps were removed with a cold biopsy forceps.       Resection and retrieval were complete.      A 3 mm polyp was found in the rectum. The polyp was sessile. The polyp       was removed with a cold biopsy forceps. Resection and retrieval were       complete.      Non-bleeding internal hemorrhoids were found during retroflexion. The       hemorrhoids were small and Grade I (internal hemorrhoids that do not       prolapse).      The digital rectal exam was normal. Impression:           - Preparation of the colon was fair.                       - Patent functional end-to-end ileo-colonic                        anastomosis, characterized by healthy appearing mucosa.                       - Patent functional end-to-end colo-colonic                        anastomosis, characterized by healthy appearing mucosa.                       - Diverticulosis in the sigmoid colon, in the                        descending colon and in the transverse colon.                       - One 5 mm polyp in the proximal transverse colon,                        removed with a cold snare. Resected and retrieved.                       - One 3 mm polyp in the descending colon, removed with                        a cold biopsy forceps. Resected and retrieved.                       - Two 2 to 3 mm polyps in the sigmoid colon, removed                        with a cold biopsy forceps. Resected and retrieved.                       -  One 3 mm polyp in the rectum,  removed with a cold                        biopsy forceps. Resected and retrieved.                       - Non-bleeding internal hemorrhoids. Recommendation:       - Discharge patient to home.                       - Soft diet today, then advance as tolerated to advance                        diet as tolerated.                       - Await pathology results.                       - Return to GI clinic in 6 weeks. Procedure Code(s):    --- Professional ---                       [phone], Colonoscopy, flexible; with removal of tumor(s),                        polyp(s), or other lesion(s) by snare technique                       45380, 52, Colonoscopy, flexible; with biopsy, single                        or multiple Diagnosis Code(s):    --- Professional ---                       K64.0, First degree hemorrhoids                       Z98.0, Intestinal bypass and anastomosis status                       K63.5, Polyp of colon                       K62.1, Rectal polyp                       Z85.038, Personal history of other malignant neoplasm                        of large intestine                       K57.30, Diverticulosis of large intestine without                        perforation or abscess without bleeding CPT copyright 2019 American Medical Association. All rights reserved. The codes documented in this report are preliminary and upon coder review may  be revised to meet current compliance requirements. Lollie Sails, MD 07/16/2018 8:51:30 AM This report has been signed electronically. Number of Addenda: 0 Note Initiated On: 07/16/2018 7:13 AM Scope Withdrawal Time: 0 hours 7 minutes 46 seconds  Total Procedure Duration: 0 hours 18 minutes 43 seconds       [facility]

## 2018-07-16 NOTE — Anesthesia Post-op Follow-up Note (Signed)
Anesthesia QCDR form completed.        

## 2018-07-16 NOTE — H&P (Signed)
Outpatient short stay form Pre-procedure 07/16/2018 7:34 AM Lollie Sails MD  Primary Physician: Dr. Nolon Stalls  Reason for visit: EGD and colonoscopy  History of present illness: Patient is a 67 year old male presenting today for an EGD and colonoscopy in regards to his personal history of colon cancer with a increasing CEA level.  His colon cancer was found and removed by right hemicolectomy a laparoscopic partial transverse colectomy on 11/08/2012.  No lymph node involvement.  Did have chemotherapy he did not have any radiation treatment.  Overall he is doing well.  He denies any diarrhea abdominal pain or blood in his stool.  He has been losing a little bit of weight.  Does have some symptoms at times of reflux for which he takes Tums.  He denies use of any aspirin or blood thinner.  Current Facility-Administered Medications:  .  0.9 %  sodium chloride infusion, , Intravenous, Continuous, Lollie Sails, MD, Last Rate: 20 mL/hr at 07/16/18 0726 .  0.9 %  sodium chloride infusion, , Intravenous, Continuous, Lollie Sails, MD .  0.9 %  sodium chloride infusion, , Intravenous, Continuous, Lollie Sails, MD  Facility-Administered Medications Ordered in Other Encounters:  .  sodium chloride 0.9 % injection 10 mL, 10 mL, Intravenous, PRN, Mike Gip, Melissa C, MD, 10 mL at 12/13/14 1043 .  sodium chloride flush (NS) 0.9 % injection 10 mL, 10 mL, Intravenous, PRN, [place], Melissa C, MD, 10 mL at 08/20/16 1050 .  sodium chloride flush (NS) 0.9 % injection 10 mL, 10 mL, Intravenous, PRN, Mike Gip, Melissa C, MD, 10 mL at 02/03/18 1020  Medications Prior to Admission  Medication Sig Dispense Refill Last Dose  . CELEBREX 200 MG capsule Take 200 mg by mouth daily.   0 Past Week at Unknown time  . cyclobenzaprine (FLEXERIL) 5 MG tablet Take 1 tablet (5 mg total) by mouth 3 (three) times daily as needed for muscle spasms. 30 tablet 0 Past Week at Unknown time  . gabapentin  (NEURONTIN) 300 MG capsule Take 1 capsule by mouth daily.   0 Past Week at Unknown time  . L-Methylfolate-Algae-B12-B6 (FOLTANX RF) 3-90.314-2-35 MG CAPS   0 Past Week at Unknown time  . lisinopril (PRINIVIL,ZESTRIL) 20 MG tablet Take 20 mg by mouth daily.  0 Past Week at Unknown time  . LYRICA 75 MG capsule take 1 capsule by mouth twice a day 60 capsule 3 Past Week at Unknown time  . omeprazole (PRILOSEC) 20 MG capsule Take 20 mg by mouth daily.   Past Week at Unknown time  . prednisoLONE acetate (PRED FORTE) 1 % ophthalmic suspension Place 1 drop into the left eye 2 (two) times daily.   0 Past Week at Unknown time  . sildenafil (REVATIO) 20 MG tablet Take 2 tablets by mouth as needed.  0 Past Week at Unknown time     Not on File   Past Medical History:  Diagnosis Date  . Arthritis    hands  . Colon cancer ([place])   . Emphysema of lung ([facility])    mild (per pt)  . Hypertension   . Numbness    toes and fingers (S/P chemo - per pt)    Review of systems:      Physical Exam    Heart and lungs: Without rub or gallop, lungs are bilaterally clear.    HEENT: Normocephalic atraumatic eyes are anicteric    Other:    Pertinant exam for procedure: Soft nontender nondistended bowel  sounds positive normoactive.    Planned proceedures: EGD, colonoscopy and indicated procedures. I have discussed the risks benefits and complications of procedures to include not limited to bleeding, infection, perforation and the risk of sedation and the patient wishes to proceed.    Lollie Sails, MD Gastroenterology 07/16/2018  7:34 AM

## 2018-07-16 NOTE — Op Note (Signed)
[facility] Gastroenterology Patient Name: William Ford Procedure Date: 07/16/2018 7:14 AM MRN: [id number] Account #: [id number] Date of Birth: February 09, 1951 Admit Type: Outpatient Age: 68 Room: [facility] ENDO ROOM 4 Gender: Male Note Status: Finalized Procedure:            Upper GI endoscopy Indications:          Gastro-esophageal reflux disease, Weight loss Providers:            Lollie Sails, MD Referring MD:         Venetia Maxon. Elijio Miles, MD (Referring MD) Medicines:            Monitored Anesthesia Care Complications:        No immediate complications. Procedure:            Pre-Anesthesia Assessment:                       - ASA Grade Assessment: III - A patient with severe                        systemic disease.                       After obtaining informed consent, the endoscope was                        passed under direct vision. Throughout the procedure,                        the patient's blood pressure, pulse, and oxygen                        saturations were monitored continuously. The Endoscope                        was introduced through the mouth, and advanced to the                        third part of duodenum. The upper GI endoscopy was                        accomplished without difficulty. The patient tolerated                        the procedure well. Findings:      Patchy, white plaques were found in the upper third of the esophagus, in       the middle third of the esophagus and in the lower third of the       esophagus. Cells for cytology were obtained by brushing.      LA Grade B (one or more mucosal breaks greater than 5 mm, not extending       between the tops of two mucosal folds) esophagitis with no bleeding was       found. Biopsies were taken with a cold forceps for histology.      Patchy moderate inflammation characterized by adherent blood and       congestion (edema) was found in the gastric antrum. Biopsies were taken   with a cold forceps for histology.      Diffuse mild inflammation characterized by congestion (edema) and       erythema was found in  the gastric body. Biopsies were taken with a cold       forceps for histology.      The cardia and gastric fundus were normal on retroflexion.      Patchy mild inflammation characterized by congestion (edema) and       erythema was found in the duodenal bulb. Impression:           - Esophageal plaques were found, consistent with                        candidiasis. Cells for cytology obtained.                       - LA Grade B erosive esophagitis. Biopsied.                       - Atrophic gastritis. Biopsied.                       - Gastritis. Biopsied.                       - Duodenitis. Recommendation:       - Use Prilosec (omeprazole) 20 mg PO daily daily.                       - Await pathology results.                       - Return to GI clinic in 6 weeks.                       - Mycelex (clotrimazole) 10 mg lozenge 5x/day for 1                        week. 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 All rights reserved. The codes documented in this report are preliminary and upon coder review may  be revised to meet current compliance requirements. Lollie Sails, MD 07/16/2018 8:23:59 AM This report has been signed electronically. Number of Addenda: 0 Note Initiated On: 07/16/2018 7:14 AM      [facility]

## 2018-07-16 NOTE — Transfer of Care (Signed)
Immediate Anesthesia Transfer of Care Note  Patient: William Ford  Procedure(s) Performed: COLONOSCOPY WITH PROPOFOL (N/A ) ESOPHAGOGASTRODUODENOSCOPY (EGD) WITH PROPOFOL (N/A )  Patient Location: Endoscopy Unit  Anesthesia Type:General  Level of Consciousness: sedated  Airway & Oxygen Therapy: Patient Spontanous Breathing and Patient connected to nasal cannula oxygen  Post-op Assessment: Report given to RN and Post -op Vital signs reviewed and stable  Post vital signs: Reviewed and stable  Last Vitals:  Vitals Value Taken Time  BP    Temp    Pulse    Resp    SpO2      Last Pain:  Vitals:   07/16/18 0707  TempSrc: Tympanic  PainSc: 0-No pain         Complications: No apparent anesthesia complications

## 2018-07-16 NOTE — Brief Op Note (Signed)
Esophageal brushing sent to lab for KOH

## 2018-07-16 NOTE — Anesthesia Preprocedure Evaluation (Signed)
Anesthesia Evaluation  Patient identified by MRN, date of birth, ID band Patient awake    Reviewed: Allergy & Precautions, H&P , NPO status , Patient's Chart, lab work & pertinent test results, reviewed documented beta blocker date and time   History of Anesthesia Complications Negative for: history of anesthetic complications  Airway Mallampati: I  TM Distance: >3 FB Neck ROM: full    Dental  (+) Dental Advidsory Given, Edentulous Upper, Edentulous Lower   Pulmonary neg shortness of breath, neg sleep apnea, COPD,  COPD inhaler, neg recent URI, Current Smoker,    Pulmonary exam normal        Cardiovascular Exercise Tolerance: Good hypertension, (-) angina(-) CAD, (-) Past MI, (-) Cardiac Stents and (-) CABG (-) dysrhythmias (-) Valvular Problems/Murmurs     Neuro/Psych negative neurological ROS  negative psych ROS   GI/Hepatic Neg liver ROS, GERD  ,  Endo/Other  negative endocrine ROS  Renal/GU negative Renal ROS  negative genitourinary   Musculoskeletal   Abdominal   Peds  Hematology negative hematology ROS (+)   Anesthesia Other Findings Past Medical History: No date: Arthritis     Comment:  hands No date: Colon cancer (HCC) No date: Emphysema of lung (HCC)     Comment:  mild (per pt) No date: Hypertension No date: Numbness     Comment:  toes and fingers (S/P chemo - per pt)   Reproductive/Obstetrics negative OB ROS                             Anesthesia Physical Anesthesia Plan  ASA: III  Anesthesia Plan: General   Post-op Pain Management:    Induction: Intravenous  PONV Risk Score and Plan: 1 and Propofol infusion and TIVA  Airway Management Planned: Nasal Cannula  Additional Equipment:   Intra-op Plan:   Post-operative Plan:   Informed Consent: I have reviewed the patients History and Physical, chart, labs and discussed the procedure including the risks, benefits  and alternatives for the proposed anesthesia with the patient or authorized representative who has indicated his/her understanding and acceptance.     Dental Advisory Given  Plan Discussed with: Anesthesiologist, CRNA and Surgeon  Anesthesia Plan Comments:         Anesthesia Quick Evaluation

## 2018-07-16 NOTE — Anesthesia Postprocedure Evaluation (Signed)
Anesthesia Post Note  Patient: William Ford  Procedure(s) Performed: COLONOSCOPY WITH PROPOFOL (N/A ) ESOPHAGOGASTRODUODENOSCOPY (EGD) WITH PROPOFOL (N/A )  Patient location during evaluation: Endoscopy Anesthesia Type: General Level of consciousness: awake and alert Pain management: pain level controlled Vital Signs Assessment: post-procedure vital signs reviewed and stable Respiratory status: spontaneous breathing, nonlabored ventilation, respiratory function stable and patient connected to nasal cannula oxygen Cardiovascular status: blood pressure returned to baseline and stable Postop Assessment: no apparent nausea or vomiting Anesthetic complications: no     Last Vitals:  Vitals:   07/16/18 0910 07/16/18 0920  BP: 115/78 134/84  Pulse: 76 78  Resp: 16 16  Temp:    SpO2: 99% 99%    Last Pain:  Vitals:   07/16/18 0840  TempSrc: Tympanic  PainSc:                  Martha Clan

## 2018-07-19 ENCOUNTER — Encounter: Payer: Self-pay | Admitting: Gastroenterology

## 2018-07-20 LAB — SURGICAL PATHOLOGY

## 2018-07-21 ENCOUNTER — Other Ambulatory Visit: Payer: Self-pay

## 2018-07-21 ENCOUNTER — Inpatient Hospital Stay: Payer: Medicare Other | Attending: Hematology and Oncology

## 2018-07-21 DIAGNOSIS — D751 Secondary polycythemia: Secondary | ICD-10-CM | POA: Insufficient documentation

## 2018-07-21 DIAGNOSIS — C182 Malignant neoplasm of ascending colon: Secondary | ICD-10-CM

## 2018-07-21 DIAGNOSIS — Z452 Encounter for adjustment and management of vascular access device: Secondary | ICD-10-CM | POA: Insufficient documentation

## 2018-07-21 LAB — CBC
HCT: 48.9 % (ref 39.0–52.0)
Hemoglobin: 15.6 g/dL (ref 13.0–17.0)
MCH: 29.8 pg (ref 26.0–34.0)
MCHC: 31.9 g/dL (ref 30.0–36.0)
MCV: 93.5 fL (ref 80.0–100.0)
Platelets: 170 K/uL (ref 150–400)
RBC: 5.23 MIL/uL (ref 4.22–5.81)
RDW: 15.2 % (ref 11.5–15.5)
WBC: 4.9 K/uL (ref 4.0–10.5)
nRBC: 0 % (ref 0.0–0.2)

## 2018-07-21 MED ORDER — HEPARIN SOD (PORK) LOCK FLUSH 100 UNIT/ML IV SOLN
500.0000 [IU] | Freq: Once | INTRAVENOUS | Status: AC
  Administered 2018-07-21: 500 [IU] via INTRAVENOUS

## 2018-07-21 MED ORDER — SODIUM CHLORIDE 0.9% FLUSH
10.0000 mL | INTRAVENOUS | Status: DC | PRN
  Administered 2018-07-21: 10 mL via INTRAVENOUS
  Filled 2018-07-21: qty 10

## 2018-07-22 ENCOUNTER — Other Ambulatory Visit: Payer: Medicare Other

## 2018-07-22 ENCOUNTER — Inpatient Hospital Stay (HOSPITAL_BASED_OUTPATIENT_CLINIC_OR_DEPARTMENT_OTHER): Payer: Medicare Other | Admitting: Hematology and Oncology

## 2018-07-22 DIAGNOSIS — F17210 Nicotine dependence, cigarettes, uncomplicated: Secondary | ICD-10-CM | POA: Diagnosis not present

## 2018-07-22 DIAGNOSIS — C182 Malignant neoplasm of ascending colon: Secondary | ICD-10-CM

## 2018-07-22 DIAGNOSIS — D751 Secondary polycythemia: Secondary | ICD-10-CM

## 2018-07-22 NOTE — Progress Notes (Signed)
[facility]  [address] [place], [place] [address] Phone: [phone]  Fax: [phone]   Telemedicine Office Visit:  07/22/2018  Referring physician: Jodi Marble, MD  I connected with Latanya Maudlin on 07/22/2018 at 2:30 PM EDT by videoconferencing and verified that I was speaking with the correct person using 2 identifiers.  The patient was at home.  I discussed the limitations, risk, security and privacy concerns of performing an evaluation and management service by videoconferencing and  the availability of in person appointments.  I also discussed with the patient that there may be a patient responsible charge related to this service.  The patient expressed understanding and agreed to proceed.   Chief Complaint: William Ford is a 68 y.o. male with stage IIB colon cancer and polycythemia who is seen for 3 month assessment.   HPI: The patient was last seen in the medical oncology clinic on 04/28/2018.  At that time, he was doing well.  He described transient GI symptoms after eating a large bowl of spaghetti.  He continues to smoke.  Exam was unremarkable.  Hemoglobin was 16.1.   EGD by Dr. Gustavo Lah on 07/16/2018 revealed esophageal plaques, consistent with candidiasis, LA Grade B erosive esophagitis, atrophic gastritis, and duodenitis.  Colonoscopy on 07/16/2018 revealed 5 polyps range from 2-45m. Pathology showed tubular adenoma and hyperplastic polyps.   Abdomen and pelvis CT on 07/06/2018 revealed no findings of recurrent or metastatic disease and aortic atherosclerosis.   During the interim, he reports "I feel good." He has been taking clotrimazole since his EGD and colonoscopy on 07/16/2018 and has been eating his normal diet without trouble, noting increased appetite. He denies any pain on swallowing and reports weight loss of 5lbs at the time of his colonoscopy.  Neuropathy in his hands has improved.  He has continued smoking 1/2 pack  per day, but is motivated to quit.    Past Medical History:  Diagnosis Date   Arthritis    hands   Colon cancer ([facility]    Emphysema of lung ([facility]    mild (per pt)   Hypertension    Numbness    toes and fingers (S/P chemo - per pt)    Past Surgical History:  Procedure Laterality Date   AMPUTATION Right 2003   4th    CATARACT EXTRACTION W/PHACO Right 02/02/2017   Procedure: CATARACT EXTRACTION PHACO AND INTRAOCULAR LENS PLACEMENT ([place] RIGHT;  Surgeon: BLeandrew Koyanagi MD;  [place]  Service: Ophthalmology;  Laterality: Right;   CATARACT EXTRACTION W/PHACO Left 02/25/2017   Procedure: CATARACT EXTRACTION PHACO AND INTRAOCULAR LENS PLACEMENT ([place] left;  Surgeon: BLeandrew Koyanagi MD;  [place]  Service: Ophthalmology;  Laterality: Left;   COLON SURGERY  2013   COLONOSCOPY     COLONOSCOPY WITH PROPOFOL N/A 07/16/2018   Procedure: COLONOSCOPY WITH PROPOFOL;  Surgeon: SLollie Sails MD;  [facility];  Service: Endoscopy;  Laterality: N/A;   ESOPHAGOGASTRODUODENOSCOPY (EGD) WITH PROPOFOL N/A 07/16/2018   Procedure: ESOPHAGOGASTRODUODENOSCOPY (EGD) WITH PROPOFOL;  Surgeon: SLollie Sails MD;  [facility];  Service: Endoscopy;  Laterality: N/A;    Family History  Problem Relation Age of Onset   Cancer Maternal Grandfather     Social History:  reports that he has been smoking cigarettes. He has a 22.50 pack-year smoking history. He has never used smokeless tobacco. He reports current alcohol use of about 2.0 standard drinks of alcohol per week. He reports  that he does not use drugs.  He started smoking at age 34.  He has stopped smoking several times.  He was smoking 1pack/week.  He has cut back over the past month. He lives in [place].  The patient has a "little sweet thing" (girlfriend since December).   Participants in the patient's visit and their role in the encounter included the  patient, his mother, and Waymon Budge, RN today. The intake visit was provided by Waymon Budge, RN.   Allergies: No Known Allergies  Current Medications: Current Outpatient Medications  Medication Sig Dispense Refill   CELEBREX 200 MG capsule Take 200 mg by mouth daily.   0   cetirizine (ZYRTEC) 10 MG tablet Take 1 tablet by mouth daily.     clotrimazole (MYCELEX) 10 MG troche Take 1 tablet by mouth 5 (five) times daily.     cyclobenzaprine (FLEXERIL) 5 MG tablet Take 1 tablet (5 mg total) by mouth 3 (three) times daily as needed for muscle spasms. 30 tablet 0   fluticasone (FLONASE) 50 MCG/ACT nasal spray Place 1 spray into both nostrils daily.     gabapentin (NEURONTIN) 300 MG capsule Take 1 capsule by mouth daily.   0   L-Methylfolate-Algae-B12-B6 (FOLTANX RF) 3-90.314-2-35 MG CAPS Take 1 tablet by mouth daily.   0   lisinopril (PRINIVIL,ZESTRIL) 20 MG tablet Take 20 mg by mouth daily.  0   LYRICA 75 MG capsule take 1 capsule by mouth twice a day 60 capsule 3   Na Sulfate-K Sulfate-Mg Sulf 17.5-3.13-1.6 GM/177ML SOLN Take by mouth daily.     omeprazole (PRILOSEC OTC) 20 MG tablet Take 20 mg by mouth daily.      omeprazole (PRILOSEC) 20 MG capsule Take 20 mg by mouth daily.     sildenafil (REVATIO) 20 MG tablet Take 2 tablets by mouth as needed.  0   No current facility-administered medications for this visit.    Facility-Administered Medications Ordered in Other Visits  Medication Dose Route Frequency Provider Last Rate Last Dose   sodium chloride 0.9 % injection 10 mL  10 mL Intravenous PRN Mike Gip, Riona Lahti C, MD   10 mL at 12/13/14 1043   sodium chloride flush (NS) 0.9 % injection 10 mL  10 mL Intravenous PRN Nolon Stalls C, MD   10 mL at 08/20/16 1050   sodium chloride flush (NS) 0.9 % injection 10 mL  10 mL Intravenous PRN Lequita Asal, MD   10 mL at 02/03/18 1020    Review of Systems  Constitutional: Positive for weight loss (5 pounds).  Negative for chills, diaphoresis, fever and malaise/fatigue.       "I feel good".   HENT: Negative.  Negative for congestion, ear pain, nosebleeds, sinus pain and sore throat.   Eyes: Negative.  Negative for blurred vision, double vision, photophobia and pain.  Respiratory: Negative for hemoptysis, sputum production and shortness of breath. Cough: chronic.        Smoking 1/2 pack per day.  Cardiovascular: Negative.  Negative for chest pain, palpitations, orthopnea, leg swelling and PND.  Gastrointestinal: Negative for abdominal pain, blood in stool, constipation, diarrhea, melena, nausea and vomiting.       Increased appetite.  Interval abdominal discomfort, resolved. Denies any pain with swallowing.   Genitourinary: Negative.  Negative for dysuria, frequency, hematuria and urgency.  Musculoskeletal: Negative.  Negative for back pain, falls, joint pain, myalgias and neck pain.  Skin: Negative.  Negative for itching and rash.  Neurological: Positive for sensory  change (improving neuropathy in hands). Negative for dizziness, tremors, speech change, weakness and headaches.  Endo/Heme/Allergies: Negative.  Does not bruise/bleed easily.  Psychiatric/Behavioral: Negative for depression and memory loss. The patient is not nervous/anxious and does not have insomnia.   All other systems reviewed and are negative.  Performance status (ECOG): 0  Physical Exam  Constitutional: He is oriented to person, place, and time and well-developed, well-nourished, and in no distress. No distress.  HENT:  Head: Normocephalic and atraumatic.  Mouth/Throat: Mucous membranes are normal.  Alopecia.  Albertina Parr.  Eyes: Conjunctivae and EOM are normal. No scleral icterus.  Glasses.  Brown eyes.   Neurological: He is alert and oriented to person, place, and time.  Skin: No pallor.  Psychiatric: Mood, affect and judgment normal.  Nursing note reviewed.   Infusion on 07/21/2018  Component Date Value Ref Range  Status   WBC 07/21/2018 4.9  4.0 - 10.5 K/uL Final   RBC 07/21/2018 5.23  4.22 - 5.81 MIL/uL Final   Hemoglobin 07/21/2018 15.6  13.0 - 17.0 g/dL Final   HCT 07/21/2018 48.9  39.0 - 52.0 % Final   MCV 07/21/2018 93.5  80.0 - 100.0 fL Final   MCH 07/21/2018 29.8  26.0 - 34.0 pg Final   MCHC 07/21/2018 31.9  30.0 - 36.0 g/dL Final   RDW 07/21/2018 15.2  11.5 - 15.5 % Final   Platelets 07/21/2018 170  150 - 400 K/uL Final   nRBC 07/21/2018 0.0  0.0 - 0.2 % Final   Performed at [facility], [address] [place], [place] [address]    Assessment:  Sully Dyment is a 68 y.o. male with stage IIB colon cancer.  He underwent right colectomy on 11/05/2012.  Pathology revealed a T3N0M0 tumor in the cecum with 17 negative lymph nodes.  Circumferential margin was positive.  There was lymphovascular invasion. He had 1 deposit of metastases in peritoneum.  Mismatch repair was positive.  He received 12 cycles of FOLFOX chemotherapy from 12/31/2012 - 08/19/2013.  Chemotherapy was interrupted because of small bowel obstruction due to adhesions in 05/2013.  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  He has had an elevated CEA with negative imaging studies.  CEA was 10.7 on 12/31/2012, 13.4 on 06/17/2013, 11.7 on 07/18/2013, 8.3 on 09/28/2013, 8.0 on 12/30/2013, 9.6 on 05/17/2014, 9.8 on 10/25/2014, 10.3 on 05/09/2015, 10.4 on 07/25/2015, 8.4 on 08/08/2015, 8.8 on 11/07/2015, 8.3 on 02/06/2016, 18.8 on 05/28/2016, 8.8 on 08/20/2016, 8.9 on 01/07/2017, 8.7 on 04/29/2017, 8.5 on 06/24/2017, 7.5 on 09/02/2017, 10.0 on 01/06/2018, 8.6 on 02/03/2018, and 8.2 on 04/28/2018.    Colonoscopy on 07/16/2018 revealed 5 polyps range from 2-42m. Pathology showed tubular adenoma and hyperplastic polyps.  EGD on 07/16/2018 revealed esophageal plaques c/w Candidiasis, LA Grade B erosive esophagitis, atrophic gastritis, and duodenitis.  Chest, abdomen, and pelvic CT scan on 07/03/2014  revealed no evidence of metastatic disease.  There was a new irregular shaped pleural based pulmonary nodule in the periphery of the RUL most likely infectious/inflammatory. 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    Chest CT on 10/20/2014 revealed a stable small pleural-based area of nodularity in the right upper lobe (favor benign area of scarring). There was mild diffuse bronchial wall thickening with moderate centrilobular and mild paraseptal emphysema; imaging findings suggestive of underlying COPD.  Chest CT on 07/19/2015 revealed no acute cardiopulmonary abnormalities.  He has a peripheral scar like density within the right upper lobe.  Chest, abdomen, and pelvic CT on 07/03/2016 revealed slight enlargement of the right upper lobe subpleural pulmonary nodule (3 mm to 4.6 mm).  There was persistent irregularity of the gallbladder wall. There was a stable 12 mm right adrenal nodule.  There was mild enlargement of the prostate gland. 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  He is followed by vascular surgery.  PSA was 0.71 on 07/23/2016.  Chest CT on 01/14/2017 revealed an interval decrease in medial right upper lobe pulmonary nodule from 5 mm on prior study to 2 mm on the current exam.  There was a 1-2 mm right lower lobe pulmonary nodule is stable since 10/20/2014 consistent with benign process.   Chest, abdomen, and pelvic CT on 07/03/2017 revealed no evidence of recurrent or metastatic carcinoma within the abdomen or pelvis.  There was a stable 1.2 cm right adrenal nodule noted. Stable 2 mm right upper and lower lobe pulmonary nodules. Several new ground glass nodules in both upper lobes, with the largest measuring 9 mm, and felt to be likely of inflammatory or infectious etiology.  Follow-up chest CT recommended in 6 months.  Chest CT on 01/11/2018 revealed the previously seen  bilateral pulmonary nodular densities had resolved.  Abdomen and pelvis CT on 07/06/2018 revealed no findings of recurrent or metastatic disease.   He has secondary polycythemia secondary to smoking. Work-up on 07/25/2015 revealed a hematocrit of 57.2, hemoglobin 19.2, MCV 95.1, platelets 169,000, WBC 8300 with an ANC of 4900.  Erythropoietin level was 6.0 (2.6-18.5).  Testosterone level was 680.  JAK2 was negative for V617F and exon 12.  Ferritin was 90, iron saturation 24% , and TIBC 372.    He undergoes small volume phlebotomies (250 cc) to maintain a hematocrit < 52 and a hemoglobin < 18.  With an elevated hematocrit, he feels sluggish/drowsy.  He feels better 2 days after his phlebotomies.  He underwent phlebotomy (500 cc) on 07/25/2015 with subsequent fatigue.  Last phlebotomy was on 01/06/2018.  Symptomatically, he "feels good".  He has been taking clotrimazole since his EGD on 07/16/2018 and has been eating his normal diet.  Appetite has increased.  He denies any pain on swallowing.  He had lost 5lbs at the time of his colonoscopy.  He is smoking 1/2 pack per day.  Hemoglobin is 15.6  Plan: 1. Review labs from 07/21/2018. 2. Stage IIB colon cancer Clinically, he is doing well. CEA is 8.2 (stable). Chest CT on 01/11/2018 revealed resolution of pulmonary nodules. Abdomen and pelvis CT on 07/06/2018 revealed no evidence of recurrent disease. 3. Secondary polycythemia Hematocrit 48.9.  Hemoglobin 15.6. Hematocrit goal < 52. No phlebotomy needed. Encourage smoking cessation. 4. Port maintenance Continue port flushes every 8-12 weeks. 5.   RTC monthly for labs (CBC) and +/- phlebotomy. 6.   RTC in 3 months for MD assessment, labs (CBC), and +/- phlebotomy.  I discussed the assessment and treatment plan with the patient.  The patient was provided an opportunity to ask questions and all were answered.  The patient agreed with the plan and demonstrated an understanding of the instructions.   The patient was advised to call back or seek an in person evaluation if the symptoms worsen or if the condition fails to improve as anticipated.  I provided 25 minutes (2:30 PM - 2:55 PM) of face-to-face video visit time during this this encounter and > 50% was spent counseling as documented  under my assessment and plan.  I provided these services from the [facility] office.   Nolon Stalls, MD 07/22/2018,2:30 PM     I, Cloyde Reams Dorshimer, am acting as scribe for Nolon Stalls, MD, PhD.   I, Kaydence Menard C. Mike Gip, MD, have reviewed the above documentation for accuracy and completeness, and I agree with the above.

## 2018-07-22 NOTE — Progress Notes (Signed)
Confirmed name, DOB, and address. Denies any concerns at this time.  

## 2018-08-18 ENCOUNTER — Ambulatory Visit: Payer: Self-pay

## 2018-08-18 ENCOUNTER — Other Ambulatory Visit: Payer: Self-pay

## 2018-08-18 ENCOUNTER — Inpatient Hospital Stay: Payer: Medicare Other | Attending: Hematology and Oncology

## 2018-08-18 VITALS — BP 117/73 | HR 89 | Temp 96.9°F | Resp 18

## 2018-08-18 DIAGNOSIS — C182 Malignant neoplasm of ascending colon: Secondary | ICD-10-CM

## 2018-08-18 DIAGNOSIS — D571 Sickle-cell disease without crisis: Secondary | ICD-10-CM | POA: Diagnosis present

## 2018-08-18 DIAGNOSIS — D751 Secondary polycythemia: Secondary | ICD-10-CM

## 2018-08-18 DIAGNOSIS — Z85038 Personal history of other malignant neoplasm of large intestine: Secondary | ICD-10-CM | POA: Insufficient documentation

## 2018-08-18 LAB — CBC
HCT: 50.3 % (ref 39.0–52.0)
Hemoglobin: 16.4 g/dL (ref 13.0–17.0)
MCH: 30.4 pg (ref 26.0–34.0)
MCHC: 32.6 g/dL (ref 30.0–36.0)
MCV: 93.1 fL (ref 80.0–100.0)
Platelets: 183 10*3/uL (ref 150–400)
RBC: 5.4 MIL/uL (ref 4.22–5.81)
RDW: 15.3 % (ref 11.5–15.5)
WBC: 7.6 10*3/uL (ref 4.0–10.5)
nRBC: 0 % (ref 0.0–0.2)

## 2018-08-18 MED ORDER — HEPARIN SOD (PORK) LOCK FLUSH 100 UNIT/ML IV SOLN
INTRAVENOUS | Status: AC
  Filled 2018-08-18: qty 5

## 2018-08-18 MED ORDER — HEPARIN SOD (PORK) LOCK FLUSH 100 UNIT/ML IV SOLN
500.0000 [IU] | Freq: Once | INTRAVENOUS | Status: AC
  Administered 2018-08-18: 500 [IU] via INTRAVENOUS

## 2018-08-18 MED ORDER — SODIUM CHLORIDE 0.9% FLUSH
10.0000 mL | INTRAVENOUS | Status: DC | PRN
  Administered 2018-08-18: 10 mL via INTRAVENOUS
  Filled 2018-08-18: qty 10

## 2018-08-18 NOTE — Patient Instructions (Signed)

## 2018-08-19 ENCOUNTER — Inpatient Hospital Stay: Payer: Medicare Other

## 2018-09-15 ENCOUNTER — Telehealth: Payer: Self-pay | Admitting: Hematology and Oncology

## 2018-09-15 NOTE — Telephone Encounter (Signed)
Called pt for appt pre-screen but got no answer. Left vm msg about screening and new guidelines about mask req, no visitors, screening questions they will be asked, and fever checks °

## 2018-09-16 ENCOUNTER — Inpatient Hospital Stay: Payer: Medicare Other

## 2018-09-16 ENCOUNTER — Inpatient Hospital Stay: Payer: Medicare Other | Attending: Hematology and Oncology

## 2018-09-16 ENCOUNTER — Other Ambulatory Visit: Payer: Self-pay

## 2018-09-16 DIAGNOSIS — Z85038 Personal history of other malignant neoplasm of large intestine: Secondary | ICD-10-CM | POA: Diagnosis not present

## 2018-09-16 DIAGNOSIS — D751 Secondary polycythemia: Secondary | ICD-10-CM | POA: Insufficient documentation

## 2018-09-16 DIAGNOSIS — Z452 Encounter for adjustment and management of vascular access device: Secondary | ICD-10-CM | POA: Insufficient documentation

## 2018-09-16 DIAGNOSIS — C182 Malignant neoplasm of ascending colon: Secondary | ICD-10-CM

## 2018-09-16 LAB — CBC
HCT: 47 % (ref 39.0–52.0)
Hemoglobin: 15.2 g/dL (ref 13.0–17.0)
MCH: 30.1 pg (ref 26.0–34.0)
MCHC: 32.3 g/dL (ref 30.0–36.0)
MCV: 93.1 fL (ref 80.0–100.0)
Platelets: 194 10*3/uL (ref 150–400)
RBC: 5.05 MIL/uL (ref 4.22–5.81)
RDW: 14.6 % (ref 11.5–15.5)
WBC: 6.3 10*3/uL (ref 4.0–10.5)
nRBC: 0 % (ref 0.0–0.2)

## 2018-09-16 MED ORDER — SODIUM CHLORIDE 0.9% FLUSH
10.0000 mL | INTRAVENOUS | Status: DC | PRN
  Administered 2018-09-16: 10 mL via INTRAVENOUS
  Filled 2018-09-16: qty 10

## 2018-09-16 MED ORDER — HEPARIN SOD (PORK) LOCK FLUSH 100 UNIT/ML IV SOLN
500.0000 [IU] | Freq: Once | INTRAVENOUS | Status: AC
  Administered 2018-09-16: 500 [IU] via INTRAVENOUS

## 2018-10-10 ENCOUNTER — Encounter: Payer: Self-pay | Admitting: Hematology and Oncology

## 2018-10-12 NOTE — Progress Notes (Signed)
[facility]  [address] [place], [place] [address] Phone: [phone]  Fax: [phone]   Clinic Day:  10/14/2018  Referring physician: Jodi Marble, MD  Chief Complaint: William Ford is a 68 y.o. male with stage IIB colon cancer and polycythemia who is seen for 3 month assessment.   HPI: The patient was last seen in the medical oncology clinic on 07/22/2018. At that time, he "felt good". He had been taking clotrimazole since his EGD on 07/16/2018 and had been eating a normal diet.  Appetite had increased. He denied any pain on swallowing. He had lost 5lbs at the time of his colonoscopy. He was smoking 1/2 pack per day. Hemoglobin was 15.6.  Labs followed: 08/18/2018: Hemoglobin 16.4, hematocrit 50.3, platelets 183,000, WBC 7,600. 09/16/2018: Hemoglobin 15.2, hematocrit 47.0, platelets 194,000, WBC 6,300.  During the interim, the patient is doing well. The patient reports a good diet. He denies any pain when swallowing. His weight is down 4 lbs. He denies his clothes fitting loose.     Past Medical History:  Diagnosis Date  . Arthritis    hands  . Colon cancer ([place])   . Emphysema of lung ([facility])    mild (per pt)  . Hypertension   . Numbness    toes and fingers (S/P chemo - per pt)    Past Surgical History:  Procedure Laterality Date  . AMPUTATION Right 2003   4th   . CATARACT EXTRACTION W/PHACO Right 02/02/2017   Procedure: CATARACT EXTRACTION PHACO AND INTRAOCULAR LENS PLACEMENT ([place]) RIGHT;  Surgeon: Leandrew Koyanagi, MD;  Location: [facility];  Service: Ophthalmology;  Laterality: Right;  . CATARACT EXTRACTION W/PHACO Left 02/25/2017   Procedure: CATARACT EXTRACTION PHACO AND INTRAOCULAR LENS PLACEMENT ([place]) left;  Surgeon: Leandrew Koyanagi, MD;  Location: [facility];  Service: Ophthalmology;  Laterality: Left;  . COLON SURGERY  2013  . COLONOSCOPY    . COLONOSCOPY WITH PROPOFOL N/A 07/16/2018   Procedure: COLONOSCOPY WITH PROPOFOL;  Surgeon: Lollie Sails, MD;  Location[facility] ENDOSCOPY;  Service: Endoscopy;  Laterality: N/A;  . ESOPHAGOGASTRODUODENOSCOPY (EGD) WITH PROPOFOL N/A 07/16/2018   Procedure: ESOPHAGOGASTRODUODENOSCOPY (EGD) WITH PROPOFOL;  Surgeon: Lollie Sails, MD;  Location: [facility] ENDOSCOPY;  Service: Endoscopy;  Laterality: N/A;    Family History  Problem Relation Age of Onset  . Cancer Maternal Grandfather     Social History:  reports that he has been smoking cigarettes. He has a 22.50 pack-year smoking history. He has never used smokeless tobacco. He reports current alcohol use of about 2.0 standard drinks of alcohol per week. He reports that he does not use drugs. He started smoking at age 52.  He has stopped smoking several times.  He was smoking 1pack/week. He is now smoking less than half a pack a day. He has cut back over the past month. He lives in [place].  The patient has a "little sweet thing" (girlfriend since December). The patient is alone today.  Allergies: No Known Allergies  Current Medications: Current Outpatient Medications  Medication Sig Dispense Refill  . CELEBREX 200 MG capsule Take 200 mg by mouth daily.   0  . cetirizine (ZYRTEC) 10 MG tablet Take 1 tablet by mouth daily.    . cyclobenzaprine (FLEXERIL) 5 MG tablet Take 1 tablet (5 mg total) by mouth 3 (three) times daily as needed for muscle spasms. 30 tablet 0  . fluticasone (FLONASE) 50 MCG/ACT nasal spray Place 1 spray into both nostrils  daily.    . gabapentin (NEURONTIN) 300 MG capsule Take 1 capsule by mouth daily.   0  . L-Methylfolate-Algae-B12-B6 (FOLTANX RF) 3-90.314-2-35 MG CAPS Take 1 tablet by mouth daily.   0  . lisinopril (PRINIVIL,ZESTRIL) 20 MG tablet Take 20 mg by mouth daily.  0  . LYRICA 75 MG capsule take 1 capsule by mouth twice a day 60 capsule 3  . Na Sulfate-K Sulfate-Mg Sulf 17.5-3.13-1.6 GM/177ML SOLN Take by mouth daily.    Marland Kitchen omeprazole (PRILOSEC OTC) 20  MG tablet Take 20 mg by mouth daily.     Marland Kitchen omeprazole (PRILOSEC) 20 MG capsule Take 20 mg by mouth daily.    . sildenafil (REVATIO) 20 MG tablet Take 2 tablets by mouth as needed.  0   No current facility-administered medications for this visit.    Facility-Administered Medications Ordered in Other Visits  Medication Dose Route Frequency Provider Last Rate Last Dose  . heparin lock flush 100 unit/mL  500 Units Intravenous Once Corcoran, Melissa C, MD      . sodium chloride 0.9 % injection 10 mL  10 mL Intravenous PRN Nolon Stalls C, MD   10 mL at 12/13/14 1043  . sodium chloride flush (NS) 0.9 % injection 10 mL  10 mL Intravenous PRN Nolon Stalls C, MD   10 mL at 08/20/16 1050  . sodium chloride flush (NS) 0.9 % injection 10 mL  10 mL Intravenous PRN Nolon Stalls C, MD   10 mL at 02/03/18 1020  . sodium chloride flush (NS) 0.9 % injection 10 mL  10 mL Intravenous PRN Lequita Asal, MD   10 mL at 10/14/18 0915    Review of Systems  Constitutional: Positive for weight loss (4 pounds). Negative for chills, diaphoresis, fever and malaise/fatigue.       Doing well.  HENT: Negative.  Negative for congestion, ear pain, nosebleeds, sinus pain and sore throat.   Eyes: Negative.  Negative for blurred vision, double vision, photophobia and pain.  Respiratory: Negative for hemoptysis, sputum production and shortness of breath. Cough: chronic.        Smoking 1/2 pack per day.  Cardiovascular: Negative.  Negative for chest pain, palpitations, orthopnea, leg swelling and PND.  Gastrointestinal: Negative for abdominal pain, blood in stool, constipation, diarrhea, melena, nausea and vomiting.  Genitourinary: Negative.  Negative for dysuria, frequency, hematuria and urgency.  Musculoskeletal: Negative.  Negative for back pain, falls, joint pain, myalgias and neck pain.  Skin: Negative.  Negative for itching and rash.  Neurological: Positive for sensory change (improving neuropathy in  hands). Negative for dizziness, tremors, speech change, weakness and headaches.  Endo/Heme/Allergies: Negative.  Does not bruise/bleed easily.  Psychiatric/Behavioral: Negative for depression and memory loss. The patient is not nervous/anxious and does not have insomnia.   All other systems reviewed and are negative.  Performance status (ECOG): 0  Vitals Blood pressure (!) 141/91, pulse 77, temperature 98.9 F (37.2 C), temperature source Tympanic, resp. rate 18, height _0  (1.854 m), weight 133 lb 11.3 oz (60.6 kg), SpO2 100 %.  Physical Exam  Constitutional: He is oriented to person, place, and time. He appears well-developed and well-nourished. No distress.  HENT:  Head: Normocephalic and atraumatic.  Mouth/Throat: Oropharynx is clear and moist. No oropharyngeal exudate.  Alopecia. White goatee.  Eyes: Pupils are equal, round, and reactive to light. Conjunctivae and EOM are normal. No scleral icterus.  Glasses. Brown eyes.  Neck: Normal range of motion. Neck supple.  Cardiovascular:  Normal rate, regular rhythm and normal heart sounds.  No murmur heard. Pulmonary/Chest: Effort normal and breath sounds normal. No respiratory distress.  Abdominal: Soft. Bowel sounds are normal. There is no abdominal tenderness.  Musculoskeletal: Normal range of motion.        General: No tenderness or edema.  Lymphadenopathy:    He has no cervical adenopathy.    He has no axillary adenopathy.       Right: No supraclavicular adenopathy present.       Left: No supraclavicular adenopathy present.  Neurological: He is alert and oriented to person, place, and time. He has normal reflexes.  Skin: Skin is warm and dry. He is not diaphoretic.  Psychiatric: He has a normal mood and affect. His behavior is normal. Judgment and thought content normal.  Nursing note and vitals reviewed.   Imaging studies: 07/03/2014:  Chest, abdomen, and pelvis CT revealed no evidence of metastatic disease.  There was a  new irregular shaped pleural based pulmonary nodule in the periphery of the RUL most likely infectious/inflammatory. 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   10/20/2014:  Chest CT revealed a stable small pleural-based area of nodularity in the right upper lobe (favor benign area of scarring). There was mild diffuse bronchial wall thickening with moderate centrilobular and mild paraseptal emphysema; imaging findings suggestive of underlying COPD.   07/19/2015:  Chest CT revealed no acute cardiopulmonary abnormalities.  He has a peripheral scar like density within the right upper lobe.  07/03/2016:  Chest, abdomen, and pelvis CT on 07/03/2016 revealed slight enlargement of the right upper lobe subpleural pulmonary nodule (3 mm to 4.6 mm).  There was persistent irregularity of the gallbladder wall. There was a stable 12 mm right adrenal nodule.  There was mild enlargement of the prostate gland. 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  01/14/2017:  Chest CT revealed an interval decrease in medial right upper lobe pulmonary nodule from 5 mm on prior study to 2 mm on the current exam.  There was a 1-2 mm right lower lobe pulmonary nodule is stable since 10/20/2014 consistent with benign process.  07/03/2017:  Chest, abdomen, and pelvis CT revealed no evidence of recurrent or metastatic carcinoma within the abdomen or pelvis.  There was a stable 1.2 cm right adrenal nodule noted. Stable 2 mm right upper and lower lobe pulmonary nodules. Several new ground glass nodules in both upper lobes, with the largest measuring 9 mm, and felt to be likely of inflammatory or infectious etiology.  Follow-up chest CT recommended in 6 months. 01/11/2018:  Chest CT revealed the previously seen bilateral pulmonary nodular densities had resolved. 07/06/2018:  Abdomen and pelvis CT revealed no findings of recurrent  or metastatic disease.    Infusion on 10/14/2018  Component Date Value Ref Range Status  . WBC 10/14/2018 6.6  4.0 - 10.5 K/uL Final  . RBC 10/14/2018 5.12  4.22 - 5.81 MIL/uL Final  . Hemoglobin 10/14/2018 15.5  13.0 - 17.0 g/dL Final  . HCT 10/14/2018 47.7  39.0 - 52.0 % Final  . MCV 10/14/2018 93.2  80.0 - 100.0 fL Final  . MCH 10/14/2018 30.3  26.0 - 34.0 pg Final  . MCHC 10/14/2018 32.5  30.0 - 36.0 g/dL Final  . RDW 10/14/2018 14.6  11.5 - 15.5 % Final  . Platelets 10/14/2018 190  150 - 400 K/uL Final   Comment: Immature Platelet Fraction may be  clinically indicated, consider ordering this additional test [id number]   . nRBC 10/14/2018 0.0  0.0 - 0.2 % Final   Performed at [facility], [address], [place], [place] [address]    Assessment:  William Ford is a 68 y.o. male with stage IIB colon cancer.  He underwent right colectomy on 11/05/2012.  Pathology revealed a T3N0M0 tumor in the cecum with 17 negative lymph nodes.  Circumferential margin was positive.  There was lymphovascular invasion. He had 1 deposit of metastases in peritoneum.  Mismatch repair was positive.  He received 12 cycles of FOLFOX chemotherapy from 12/31/2012 - 08/19/2013.  Chemotherapy was interrupted because of small bowel obstruction due to adhesions in 05/2013.  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  He has an elevated CEA with negative imaging studies.  CEA was 10.7 on 12/31/2012, 13.4 on 06/17/2013, 11.7 on 07/18/2013, 8.3 on 09/28/2013, 8.0 on 12/30/2013, 9.6 on 05/17/2014, 9.8 on 10/25/2014, 10.3 on 05/09/2015, 10.4 on 07/25/2015, 8.4 on 08/08/2015, 8.8 on 11/07/2015, 8.3 on 02/06/2016, 18.8 on 05/28/2016, 8.8 on 08/20/2016, 8.9 on 01/07/2017, 8.7 on 04/29/2017, 8.5 on 06/24/2017, 7.5 on 09/02/2017, 10.0 on 01/06/2018, 8.6 on 02/03/2018, and 8.2 on 04/28/2018.    Colonoscopy on 07/16/2018 revealed 5 polyps range from 2-61m. Pathology showed tubular adenoma and  hyperplastic polyps.  EGD on 07/16/2018 revealed esophageal plaques c/w Candidiasis, LA Grade B erosive esophagitis, atrophic gastritis, and duodenitis.  Chest CT on 01/11/2018 revealed the previously seen bilateral pulmonary nodular densities had resolved.  Abdomen and pelvis CT on 07/06/2018 revealed no findings of recurrent or metastatic disease.   He has secondary polycythemia secondary to smoking. Work-up on 07/25/2015 revealed a hematocrit of 57.2, hemoglobin 19.2, MCV 95.1, platelets 169,000, WBC 8300 with an ANC of 4900.  Erythropoietin level was 6.0 (2.6-18.5).  Testosterone level was 680.  JAK2 was negative for V617F and exon 12.  Ferritin was 90, iron saturation 24% , and TIBC 372.    He undergoes small volume phlebotomies (250 cc) to maintain a hematocrit < 52 and a hemoglobin < 18.  With an elevated hematocrit, he feels sluggish/drowsy.  He feels better 2 days after his phlebotomies.  He underwent phlebotomy (500 cc) on 07/25/2015 with subsequent fatigue.  Last phlebotomy was on 08/18/2018.  Symptomatically, he is doing well.  Exam is unremarkable.  Plan: 1.   Labs today:  CBC. 2.   Stage IIB colon cancer Clinically, he is doing well. CEA was 8.2 (stable) on 04/28/2018. Abdomen and pelvic CT on 07/06/2018 revealed no evidence of recurrent disease. Continue surveillance. 3.   Secondary polycythemia Hematocrit 47.7.  Hemoglobin 15.5. Hematocrit goal < 52. No phlebotomy needed. He continues to smoke 1/2 pack a day. Encourage smoking cessation. 4.   Port maintenance Discuss Port-A-Cath removal. 5.   RTC every other month for labs (CBC) and+/-phlebotomy. 6.   RTCin 6 months for MD assessment, labs(CBC with diff, CMP, CEA),and +/- phlebotomy.  I discussed the assessment and treatment plan with the patient.  The patient was provided an opportunity to ask questions and all were answered.  The patient agreed with the plan and demonstrated an understanding of the instructions.   The patient was advised to call back if the symptoms worsen or if the condition fails to improve as anticipated.   MLequita Asal MD, PhD    10/14/2018, 10:49 AM  I, ASelena Batten am acting as scribe for [employer] CMike Gip MD, PhD.  I,  Melissa C. Mike Gip, MD, have reviewed the above documentation for accuracy and completeness, and I agree with the above.

## 2018-10-13 ENCOUNTER — Other Ambulatory Visit: Payer: Self-pay

## 2018-10-14 ENCOUNTER — Inpatient Hospital Stay: Payer: Medicare Other

## 2018-10-14 ENCOUNTER — Inpatient Hospital Stay: Payer: Medicare Other | Attending: Hematology and Oncology | Admitting: Hematology and Oncology

## 2018-10-14 ENCOUNTER — Encounter: Payer: Self-pay | Admitting: Hematology and Oncology

## 2018-10-14 VITALS — BP 141/91 | HR 77 | Temp 98.9°F | Resp 18 | Ht 73.0 in | Wt 133.7 lb

## 2018-10-14 DIAGNOSIS — C182 Malignant neoplasm of ascending colon: Secondary | ICD-10-CM

## 2018-10-14 DIAGNOSIS — Z85038 Personal history of other malignant neoplasm of large intestine: Secondary | ICD-10-CM | POA: Insufficient documentation

## 2018-10-14 DIAGNOSIS — R970 Elevated carcinoembryonic antigen [CEA]: Secondary | ICD-10-CM

## 2018-10-14 DIAGNOSIS — D751 Secondary polycythemia: Secondary | ICD-10-CM

## 2018-10-14 DIAGNOSIS — F17210 Nicotine dependence, cigarettes, uncomplicated: Secondary | ICD-10-CM | POA: Diagnosis not present

## 2018-10-14 LAB — CBC
HCT: 47.7 % (ref 39.0–52.0)
Hemoglobin: 15.5 g/dL (ref 13.0–17.0)
MCH: 30.3 pg (ref 26.0–34.0)
MCHC: 32.5 g/dL (ref 30.0–36.0)
MCV: 93.2 fL (ref 80.0–100.0)
Platelets: 190 10*3/uL (ref 150–400)
RBC: 5.12 MIL/uL (ref 4.22–5.81)
RDW: 14.6 % (ref 11.5–15.5)
WBC: 6.6 10*3/uL (ref 4.0–10.5)
nRBC: 0 % (ref 0.0–0.2)

## 2018-10-14 MED ORDER — SODIUM CHLORIDE 0.9% FLUSH
10.0000 mL | INTRAVENOUS | Status: DC | PRN
  Administered 2018-10-14: 10 mL via INTRAVENOUS
  Filled 2018-10-14: qty 10

## 2018-10-14 MED ORDER — HEPARIN SOD (PORK) LOCK FLUSH 100 UNIT/ML IV SOLN
500.0000 [IU] | Freq: Once | INTRAVENOUS | Status: AC
  Administered 2018-10-14: 500 [IU] via INTRAVENOUS

## 2018-10-14 NOTE — Progress Notes (Signed)
No new changes noted today 

## 2018-10-27 ENCOUNTER — Other Ambulatory Visit: Payer: Self-pay

## 2018-10-27 ENCOUNTER — Encounter: Payer: Self-pay | Admitting: Surgery

## 2018-10-27 ENCOUNTER — Ambulatory Visit: Payer: Medicare Other | Admitting: Surgery

## 2018-10-27 VITALS — BP 147/71 | HR 96 | Temp 97.3°F | Ht 68.0 in | Wt 134.0 lb

## 2018-10-27 DIAGNOSIS — C182 Malignant neoplasm of ascending colon: Secondary | ICD-10-CM

## 2018-10-27 NOTE — Progress Notes (Signed)
Procedure Note  Dx: Hx colon CA  Procedure: Removal of port  Anesthesia: 8 cc lidocaine 1% w epi  EBL: minimal  Complications: none  After informed consent was obtained the patient was prepped and draped in the usual sterile fashion.  The skin and subcutaneous tissue was infiltrated with lidocaine.  15 blade knife used to create incision and Metzenbaum scissors were used to dissect the pocket and we entered and released the port from adjacent structures.  We also cut the 2 Prolene sutures that were attached to the port to the chest wall.  Port was removed after we will the patient was asked to do a Valsalva maneuver pressure was held for a couple minutes.  The wound was closed in a 2 layer fashion with multiple interrupted 3-0 Vicryls and 4-0 Monocryl for the skin.  Dermabond was used to coat the skin.  No complications

## 2018-10-27 NOTE — Patient Instructions (Signed)
May shower starting Friday morning May use ice pack as needed for comfort, Tylenol or Ibuprofen if needed. The glue we used will start to fall off in a few weeks  Follow-up with our office as needed.  Please call and ask to speak with a nurse if you develop questions or concerns.

## 2018-12-09 ENCOUNTER — Inpatient Hospital Stay: Payer: Medicare Other | Attending: Internal Medicine

## 2018-12-09 ENCOUNTER — Inpatient Hospital Stay: Payer: Medicare Other

## 2018-12-17 ENCOUNTER — Inpatient Hospital Stay: Payer: Medicare Other | Attending: Hematology and Oncology

## 2018-12-17 ENCOUNTER — Other Ambulatory Visit: Payer: Self-pay

## 2018-12-17 DIAGNOSIS — D751 Secondary polycythemia: Secondary | ICD-10-CM | POA: Diagnosis not present

## 2018-12-17 DIAGNOSIS — Z85038 Personal history of other malignant neoplasm of large intestine: Secondary | ICD-10-CM | POA: Insufficient documentation

## 2018-12-17 DIAGNOSIS — F17210 Nicotine dependence, cigarettes, uncomplicated: Secondary | ICD-10-CM | POA: Diagnosis not present

## 2018-12-17 DIAGNOSIS — C182 Malignant neoplasm of ascending colon: Secondary | ICD-10-CM

## 2018-12-17 LAB — CBC
HCT: 53.2 % — ABNORMAL HIGH (ref 39.0–52.0)
Hemoglobin: 17.3 g/dL — ABNORMAL HIGH (ref 13.0–17.0)
MCH: 30.8 pg (ref 26.0–34.0)
MCHC: 32.5 g/dL (ref 30.0–36.0)
MCV: 94.8 fL (ref 80.0–100.0)
Platelets: 190 10*3/uL (ref 150–400)
RBC: 5.61 MIL/uL (ref 4.22–5.81)
RDW: 15.4 % (ref 11.5–15.5)
WBC: 7 10*3/uL (ref 4.0–10.5)
nRBC: 0 % (ref 0.0–0.2)

## 2018-12-17 MED ORDER — HEPARIN SOD (PORK) LOCK FLUSH 100 UNIT/ML IV SOLN: 500.0000 [IU] | Freq: Once | INTRAVENOUS | Status: AC

## 2018-12-17 MED ORDER — SODIUM CHLORIDE 0.9% FLUSH
10.0000 mL | INTRAVENOUS | Status: AC | PRN
  Filled 2018-12-17: qty 10

## 2018-12-17 NOTE — Progress Notes (Unsigned)
Patient no longer has a port. It was removed last month.

## 2018-12-17 NOTE — Patient Instructions (Signed)

## 2019-02-02 ENCOUNTER — Other Ambulatory Visit: Payer: Self-pay

## 2019-02-03 ENCOUNTER — Inpatient Hospital Stay: Payer: Medicare Other | Attending: Hematology and Oncology

## 2019-02-03 ENCOUNTER — Inpatient Hospital Stay: Payer: Medicare Other

## 2019-02-03 DIAGNOSIS — D751 Secondary polycythemia: Secondary | ICD-10-CM | POA: Diagnosis not present

## 2019-02-03 DIAGNOSIS — C182 Malignant neoplasm of ascending colon: Secondary | ICD-10-CM

## 2019-02-03 DIAGNOSIS — Z85038 Personal history of other malignant neoplasm of large intestine: Secondary | ICD-10-CM | POA: Insufficient documentation

## 2019-02-03 LAB — CBC
HCT: 47.9 % (ref 39.0–52.0)
Hemoglobin: 16 g/dL (ref 13.0–17.0)
MCH: 31.8 pg (ref 26.0–34.0)
MCHC: 33.4 g/dL (ref 30.0–36.0)
MCV: 95.2 fL (ref 80.0–100.0)
Platelets: 213 10*3/uL (ref 150–400)
RBC: 5.03 MIL/uL (ref 4.22–5.81)
RDW: 13.2 % (ref 11.5–15.5)
WBC: 10.9 10*3/uL — ABNORMAL HIGH (ref 4.0–10.5)
nRBC: 0 % (ref 0.0–0.2)

## 2019-02-03 NOTE — Progress Notes (Signed)
Hematocrit is less than 50. No phlebotomy required today.

## 2019-03-29 NOTE — Progress Notes (Signed)
[facility]  [address] [place], [place] [address] Phone: [phone]  Fax: [phone]   Clinic Day:  03/31/2019  Referring physician: Jodi Marble, MD  Chief Complaint: William Ford is a 68 y.o. male with stage IIB colon cancer and polycythemia who is seen for 5 month assessment.   HPI: The patient was last seen in the medical oncology clinic on 10/14/2018. At that time, he was doing well. Exam was unremarkable. Hematocrit 47.7 (goal < 52), hemoglobin 15.5, MCV 93.2, platelets 190,000, WBC 6,600. Patient did not undergo a phlebotomy. He was smoking 1/2 pack a day; smoking cessation encouraged.    Patient had his port-a-cath removed on 10/27/2018 with Dr. Dahlia Byes.   Labs followed: 12/17/2018: Hematocrit 53.2, hemoglobin 17.3, MCV 94.8, platelets 190,000, WBC 7,000.  He underwent phlebotomy. 02/03/2019: Hematocrit 47.9, hemoglobin 16.0, MCV 95.2, platelets 213,000, WBC 10,900.  During the interim, he has felt "good". He is smoking half a pack of cigarettes a day. Smoking cessation was encouraged. 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 His bowel are normal. He denies any melena or bloody stool. He denies having any headaches, tingling or numbness.  Patient would like to wait next month for a phlebotomy.    Past Medical History:  Diagnosis Date  . Arthritis    hands  . Colon cancer (William Ford)   . Emphysema of lung (HCC)    mild (per pt)  . Hypertension   . Numbness    toes and fingers (S/P chemo - per pt)    Past Surgical History:  Procedure Laterality Date  . AMPUTATION Right 2003   4th   . CATARACT EXTRACTION W/PHACO Right 02/02/2017   Procedure: CATARACT EXTRACTION PHACO AND INTRAOCULAR LENS PLACEMENT ([place]) RIGHT;  Surgeon: Leandrew Koyanagi, MD;  Location: [place];  Service: Ophthalmology;  Laterality: Right;  . CATARACT EXTRACTION W/PHACO Left 02/25/2017   Procedure: CATARACT EXTRACTION PHACO AND INTRAOCULAR LENS  PLACEMENT ([place]) left;  Surgeon: Leandrew Koyanagi, MD;  Location: [facility];  Service: Ophthalmology;  Laterality: Left;  . COLON SURGERY  2013  . COLONOSCOPY    . COLONOSCOPY WITH PROPOFOL N/A 07/16/2018   Procedure: COLONOSCOPY WITH PROPOFOL;  Surgeon: Lollie Sails, MD;  Location: [facility] ENDOSCOPY;  Service: Endoscopy;  Laterality: N/A;  . ESOPHAGOGASTRODUODENOSCOPY (EGD) WITH PROPOFOL N/A 07/16/2018   Procedure: ESOPHAGOGASTRODUODENOSCOPY (EGD) WITH PROPOFOL;  Surgeon: Lollie Sails, MD;  Location: [facility] ENDOSCOPY;  Service: Endoscopy;  Laterality: N/A;    Family History  Problem Relation Age of Onset  . Cancer Maternal Grandfather     Social History:  reports that he has been smoking cigarettes. He has a 22.50 pack-year smoking history. He has never used smokeless tobacco. He reports current alcohol use of about 2.0 standard drinks of alcohol per week. He reports that he does not use drugs. He started smoking at age 32. He has stopped smoking several times. He was smoking 1pack/week. He is now smoking less than half a pack a day.He has cut back over the past month. He lives in [place]. The patient has a "little sweet thing" (girlfriend since December). The patient is alone today.  Allergies: No Known Allergies  Current Medications: Current Outpatient Medications  Medication Sig Dispense Refill  . CELEBREX 200 MG capsule Take 200 mg by mouth daily.   0  . cetirizine (ZYRTEC) 10 MG tablet Take 1 tablet by mouth daily.    . cyclobenzaprine (FLEXERIL) 5 MG tablet Take 1 tablet (  5 mg total) by mouth 3 (three) times daily as needed for muscle spasms. 30 tablet 0  . fluticasone (FLONASE) 50 MCG/ACT nasal spray Place 1 spray into both nostrils daily.    Marland Kitchen gabapentin (NEURONTIN) 300 MG capsule Take 1 capsule by mouth daily.   0  . L-Methylfolate-Algae-B12-B6 (FOLTANX RF) 3-90.314-2-35 MG CAPS Take 1 tablet by mouth daily.   0  . lisinopril (PRINIVIL,ZESTRIL) 20 MG  tablet Take 20 mg by mouth daily.  0  . LYRICA 75 MG capsule take 1 capsule by mouth twice a day 60 capsule 3  . Na Sulfate-K Sulfate-Mg Sulf 17.5-3.13-1.6 GM/177ML SOLN Take by mouth daily.    Marland Kitchen omeprazole (PRILOSEC) 20 MG capsule Take 20 mg by mouth daily.    . sildenafil (REVATIO) 20 MG tablet Take 2 tablets by mouth as needed.  0   No current facility-administered medications for this visit.   Facility-Administered Medications Ordered in Other Visits  Medication Dose Route Frequency Provider Last Rate Last Admin  . heparin lock flush 100 unit/mL  500 Units Intravenous Once Samari Gorby C, MD      . sodium chloride 0.9 % injection 10 mL  10 mL Intravenous PRN Nolon Stalls C, MD   10 mL at 12/13/14 1043  . sodium chloride flush (NS) 0.9 % injection 10 mL  10 mL Intravenous PRN Nolon Stalls C, MD   10 mL at 08/20/16 1050  . sodium chloride flush (NS) 0.9 % injection 10 mL  10 mL Intravenous PRN Nolon Stalls C, MD   10 mL at 02/03/18 1020  . sodium chloride flush (NS) 0.9 % injection 10 mL  10 mL Intravenous PRN Lequita Asal, MD        Review of Systems  Constitutional: Negative.  Negative for chills, diaphoresis, fever, malaise/fatigue and weight loss (up 3 pounds).       Feels "good".  HENT: Negative.  Negative for congestion, ear pain, nosebleeds, sinus pain and sore throat.   Eyes: Negative.  Negative for blurred vision, double vision, photophobia and pain.  Respiratory: Negative for hemoptysis, sputum production and shortness of breath. Cough: chronic.        Smoking 1/2 pack per day.  Cardiovascular: Negative.  Negative for chest pain, palpitations, orthopnea, leg swelling and PND.  Gastrointestinal: Negative.  Negative for abdominal pain, blood in stool, constipation, diarrhea, melena, nausea and vomiting.       Normal bowels.  Genitourinary: Negative.  Negative for dysuria, frequency, hematuria and urgency.  Musculoskeletal: Negative.  Negative for back  pain, falls, joint pain, myalgias and neck pain.  Skin: Negative.  Negative for itching and rash.  Neurological: Positive for sensory change (improving neuropathy in hands). Negative for dizziness, tremors, speech change, weakness and headaches.  Endo/Heme/Allergies: Negative.  Does not bruise/bleed easily.  Psychiatric/Behavioral: Negative.  Negative for depression and memory loss. The patient is not nervous/anxious and does not have insomnia.   All other systems reviewed and are negative.  Performance status (ECOG): 0  Vitals Blood pressure 133/70, pulse 80, temperature (!) 96 F (35.6 C), temperature source Tympanic, weight 136 lb 14.5 oz (62.1 kg), SpO2 99 %.   Physical Exam  Constitutional: He is oriented to person, place, and time. He appears well-developed and well-nourished. No distress.  HENT:  Head: Normocephalic and atraumatic.  Mouth/Throat: Oropharynx is clear and moist. No oropharyngeal exudate.  Cap.  Alopecia.  White goatee.  Mask.  Eyes: Pupils are equal, round, and reactive to light. Conjunctivae  and EOM are normal. No scleral icterus.  Glasses. Brown eyes.  Cardiovascular: Normal rate, regular rhythm and normal heart sounds.  No murmur heard. Pulmonary/Chest: Effort normal and breath sounds normal. No respiratory distress. He has no wheezes. He has no rales.  Abdominal: Soft. Bowel sounds are normal. He exhibits no distension and no mass. There is no abdominal tenderness. There is no rebound and no guarding.  Musculoskeletal:        General: No tenderness or edema. Normal range of motion.     Cervical back: Normal range of motion and neck supple.  Lymphadenopathy:       Head (right side): No preauricular, no posterior auricular and no occipital adenopathy present.       Head (left side): No preauricular, no posterior auricular and no occipital adenopathy present.    He has no cervical adenopathy.    He has no axillary adenopathy.       Right: No supraclavicular  adenopathy present.       Left: No inguinal and no supraclavicular adenopathy present.  Neurological: He is alert and oriented to person, place, and time. He has normal reflexes.  Skin: Skin is warm and dry. No rash noted. He is not diaphoretic. No erythema. No pallor.  Psychiatric: He has a normal mood and affect. His behavior is normal. Judgment and thought content normal.  Nursing note and vitals reviewed.   Imaging studies: 07/03/2014:  Chest, abdomen, and pelvis CT revealed no evidence of metastatic disease. There was a new irregular shaped pleural based pulmonary nodule in the periphery of the RUL most likely infectious/inflammatory. 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  10/20/2014:  Chest CTrevealed a stable small pleural-based area of nodularity in the right upper lobe (favor benign area of scarring). There was mild diffuse bronchial wall thickening with moderate centrilobular and mild paraseptal emphysema; imaging findings suggestive of underlying COPD.  07/19/2015:  Chest CTrevealed no acute cardiopulmonary abnormalities. He has a peripheral scar like density within the right upper lobe.  07/03/2016:  Chest, abdomen, and pelvis CTon 07/03/2016 revealed slight enlargement of the right upper lobe subpleural pulmonary nodule (3 mm to 4.6 mm). There was persistent irregularity of the gallbladder wall. There was a stable 12 mm right adrenal nodule. There was mild enlargement of the prostate gland. There was calcific atherosclerotic disease of the aorta with persistentnear complete occlusion of the proximal superficial femoral arteriesbilaterally.  01/14/2017:  Chest CT revealed an interval decrease in medial right upper lobe pulmonary nodule from 5 mm on prior study to 2 mm on the current exam. There was a 1-2 mm right lower lobe pulmonary nodule is stable since 10/20/2014 consistent with benign process.  07/03/2017:  Chest, abdomen, and pelvis CTrevealed no evidence of  recurrent or metastatic carcinoma within the abdomen or pelvis. There was a stable 1.2 cm right adrenal nodule noted. Stable 2 mm right upper and lower lobe pulmonary nodules. Several new ground glass nodules in both upper lobes, with the largest measuring 9 mm, and felt to be likely of inflammatory or infectious etiology. Follow-up chest CT recommended in 6 months. 01/11/2018:  Chest CTrevealed the previously seen bilateral pulmonary nodular densities had resolved. 07/06/2018:  AbdomenandpelvisCTrevealed no findings of recurrent or metastatic disease.   Appointment on 03/31/2019  Component Date Value Ref Range Status  . WBC 03/31/2019 5.6  4.0 - 10.5 K/uL Final  . RBC 03/31/2019 5.49  4.22 - 5.81 MIL/uL Final  . Hemoglobin 03/31/2019 17.1* 13.0 - 17.0 g/dL Final  .  HCT 03/31/2019 52.0  39.0 - 52.0 % Final  . MCV 03/31/2019 94.7  80.0 - 100.0 fL Final  . MCH 03/31/2019 31.1  26.0 - 34.0 pg Final  . MCHC 03/31/2019 32.9  30.0 - 36.0 g/dL Final  . RDW 03/31/2019 13.6  11.5 - 15.5 % Final  . Platelets 03/31/2019 147* 150 - 400 K/uL Final  . nRBC 03/31/2019 0.0  0.0 - 0.2 % Final  . Neutrophils Relative % 03/31/2019 58  % Final  . Neutro Abs 03/31/2019 3.3  1.7 - 7.7 K/uL Final  . Lymphocytes Relative 03/31/2019 32  % Final  . Lymphs Abs 03/31/2019 1.8  0.7 - 4.0 K/uL Final  . Monocytes Relative 03/31/2019 8  % Final  . Monocytes Absolute 03/31/2019 0.4  0.1 - 1.0 K/uL Final  . Eosinophils Relative 03/31/2019 1  % Final  . Eosinophils Absolute 03/31/2019 0.1  0.0 - 0.5 K/uL Final  . Basophils Relative 03/31/2019 1  % Final  . Basophils Absolute 03/31/2019 0.1  0.0 - 0.1 K/uL Final  . Immature Granulocytes 03/31/2019 0  % Final  . Abs Immature Granulocytes 03/31/2019 0.01  0.00 - 0.07 K/uL Final   Performed at [facility], [address], [place], [place] [address]    Assessment:  William Ford is a 68 y.o. male with stage IIB colon cancer. He underwent right  colectomy on 11/05/2012. Pathology revealed a T3N0M0 tumor in the cecum with 17 negative lymph nodes. Circumferential margin was positive. There was lymphovascular invasion. He had 1 deposit of metastases in peritoneum. Mismatch repair was positive.  He received 12 cycles of FOLFOXchemotherapy from 12/31/2012 - 08/19/2013. Chemotherapy was interrupted because of small bowel obstruction due to adhesions in 05/2013. 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  He has an elevated CEA with negative imaging studies. CEA was 10.7 on 12/31/2012, 13.4 on 06/17/2013, 11.7 on 07/18/2013, 8.3 on 09/28/2013, 8.0 on 12/30/2013, 9.6 on 05/17/2014, 9.8 on 10/25/2014, 10.3 on 05/09/2015, 10.4 on 07/25/2015, 8.4 on 08/08/2015, 8.8 on 11/07/2015, 8.3 on 02/06/2016, 18.8 on 05/28/2016, 8.8 on 08/20/2016, 8.9 on 01/07/2017, 8.7 on 04/29/2017, 8.5 on 06/24/2017, 7.5 on 09/02/2017, 10.0 on 01/06/2018, 8.6 on 02/03/2018, 8.2 on 04/28/2018, and 9.7 on 03/31/2019.   Colonoscopy on 07/16/2018 revealed 5 polyps range from 2-35m. Pathology showed tubular adenoma and hyperplastic polyps.EGDon 07/16/2018 revealed esophageal plaquesc/w Candidiasis,LA Grade B erosive esophagitis, atrophic gastritis,andduodenitis.  Chest CTon 01/11/2018 revealed the previously seen bilateral pulmonary nodular densities had resolved.  AbdomenandpelvisCTon04/09/2018 revealed no findings of recurrent or metastatic disease.  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 Work-up on 07/25/2015 revealed a hematocrit of 57.2, hemoglobin 19.2, MCV 95.1, platelets 169,000, WBC 8300 with an ANC of 4900. Erythropoietin level was 6.0 (2.6-18.5). Testosterone level was 680. JAK2 was negative for V617F and exon 12. Ferritin was 90, iron saturation 24% , and TIBC 372.   He undergoes small volume phlebotomies(250 cc) to maintain a hematocrit <52 and a hemoglobin <18. With an elevated hematocrit, he feels  sluggish/drowsy. He feels better 2 days after his phlebotomies. He underwent phlebotomy (500 cc) on 07/25/2015 with subsequent fatigue. Last phlebotomy was on 12/17/2018.  Symptomatically, he is doing well.  He voices no concerns.  Exam is stable.  Plan: 1.    Labs today: CBC with diff, CMP, CEA. 2.   Stage IIB colon cancer Clinically, he is doing well. CEA is 9.7 today. CEA continues to fluctuate: 8.2 - 18.8 since completion of chemotherapy. Abdomen  and pelvic CT on 07/06/2018 revealed no evidence of recurrent disease. Continue surveillance. 3.   Secondary polycythemia Hematocrit52.0. Hemoglobin 17.1. Hematocrit goal < 52. Discuss plans for phlebotomy.  Patient would like to postpone today and reschedule. He smokes less than 1/2 pack a day. Continue to encourage smoking cessation. 4.   No phlebotomy today- patient defers. 5.   RTC in 1 month for labs (HCT/Hgb) and +/- phlebotomy. 6.   RTC in 3 months for labs (CBC) and +/- phlebotomy. 7.   RTC in 5 months for MD assessment, labs (CBC with diff, CMP, CEA, ferritin), and +/- phlebotomy.  Addendum:  CEA returned after patient's appointment.  CEA slightly higher than last check (8.2).  Check CEA at next blood draw.  I discussed the assessment and treatment plan with the patient.  The patient was provided an opportunity to ask questions and all were answered.  The patient agreed with the plan and demonstrated an understanding of the instructions.  The patient was advised to call back if the symptoms worsen or if the condition fails to improve as anticipated.   Lequita Asal, MD, PhD    03/31/2019, 1:49 PM  I, Selena Batten, am acting as scribe for [employer]. Mike Gip, MD, PhD.  I, Mannie Wineland C. Mike Gip, MD, have reviewed the above documentation for accuracy and completeness, and I agree with the above.

## 2019-03-30 ENCOUNTER — Other Ambulatory Visit: Payer: Self-pay

## 2019-03-30 ENCOUNTER — Encounter: Payer: Self-pay | Admitting: Hematology and Oncology

## 2019-03-30 NOTE — Progress Notes (Signed)
No new changes noted today. The patient name and DOB has been verified by phone today. 

## 2019-03-31 ENCOUNTER — Inpatient Hospital Stay: Payer: Medicare Other | Attending: Hematology and Oncology | Admitting: Hematology and Oncology

## 2019-03-31 ENCOUNTER — Inpatient Hospital Stay: Payer: Medicare Other

## 2019-03-31 ENCOUNTER — Encounter: Payer: Self-pay | Admitting: Hematology and Oncology

## 2019-03-31 VITALS — BP 133/70 | HR 80 | Temp 96.0°F | Wt 136.9 lb

## 2019-03-31 DIAGNOSIS — G629 Polyneuropathy, unspecified: Secondary | ICD-10-CM | POA: Diagnosis not present

## 2019-03-31 DIAGNOSIS — Z85038 Personal history of other malignant neoplasm of large intestine: Secondary | ICD-10-CM | POA: Insufficient documentation

## 2019-03-31 DIAGNOSIS — Z79899 Other long term (current) drug therapy: Secondary | ICD-10-CM | POA: Diagnosis not present

## 2019-03-31 DIAGNOSIS — R970 Elevated carcinoembryonic antigen [CEA]: Secondary | ICD-10-CM

## 2019-03-31 DIAGNOSIS — I10 Essential (primary) hypertension: Secondary | ICD-10-CM | POA: Insufficient documentation

## 2019-03-31 DIAGNOSIS — C182 Malignant neoplasm of ascending colon: Secondary | ICD-10-CM

## 2019-03-31 DIAGNOSIS — J439 Emphysema, unspecified: Secondary | ICD-10-CM | POA: Insufficient documentation

## 2019-03-31 DIAGNOSIS — D751 Secondary polycythemia: Secondary | ICD-10-CM | POA: Insufficient documentation

## 2019-03-31 DIAGNOSIS — Z791 Long term (current) use of non-steroidal anti-inflammatories (NSAID): Secondary | ICD-10-CM | POA: Insufficient documentation

## 2019-03-31 DIAGNOSIS — F17210 Nicotine dependence, cigarettes, uncomplicated: Secondary | ICD-10-CM | POA: Diagnosis not present

## 2019-03-31 DIAGNOSIS — Z9221 Personal history of antineoplastic chemotherapy: Secondary | ICD-10-CM | POA: Insufficient documentation

## 2019-03-31 LAB — COMPREHENSIVE METABOLIC PANEL
ALT: 13 U/L (ref 0–44)
AST: 23 U/L (ref 15–41)
Albumin: 4.6 g/dL (ref 3.5–5.0)
Alkaline Phosphatase: 76 U/L (ref 38–126)
Anion gap: 9 (ref 5–15)
BUN: 10 mg/dL (ref 8–23)
CO2: 26 mmol/L (ref 22–32)
Calcium: 9.6 mg/dL (ref 8.9–10.3)
Chloride: 101 mmol/L (ref 98–111)
Creatinine, Ser: 1.11 mg/dL (ref 0.61–1.24)
GFR calc Af Amer: >60 mL/min (ref >60)
GFR calc non Af Amer: >60 mL/min (ref >60)
Glucose, Bld: 88 mg/dL (ref 70–99)
Potassium: 4.7 mmol/L (ref 3.5–5.1)
Sodium: 136 mmol/L (ref 135–145)
Total Bilirubin: 1 mg/dL (ref 0.3–1.2)
Total Protein: 8.7 g/dL — ABNORMAL HIGH (ref 6.5–8.1)

## 2019-03-31 LAB — CBC WITH DIFFERENTIAL/PLATELET
Abs Immature Granulocytes: 0.01 K/uL (ref 0.00–0.07)
Basophils Absolute: 0.1 K/uL (ref 0.0–0.1)
Basophils Relative: 1 %
Eosinophils Absolute: 0.1 K/uL (ref 0.0–0.5)
Eosinophils Relative: 1 %
HCT: 52 % (ref 39.0–52.0)
Hemoglobin: 17.1 g/dL — ABNORMAL HIGH (ref 13.0–17.0)
Immature Granulocytes: 0 %
Lymphocytes Relative: 32 %
Lymphs Abs: 1.8 K/uL (ref 0.7–4.0)
MCH: 31.1 pg (ref 26.0–34.0)
MCHC: 32.9 g/dL (ref 30.0–36.0)
MCV: 94.7 fL (ref 80.0–100.0)
Monocytes Absolute: 0.4 K/uL (ref 0.1–1.0)
Monocytes Relative: 8 %
Neutro Abs: 3.3 K/uL (ref 1.7–7.7)
Neutrophils Relative %: 58 %
Platelets: 147 K/uL — ABNORMAL LOW (ref 150–400)
RBC: 5.49 MIL/uL (ref 4.22–5.81)
RDW: 13.6 % (ref 11.5–15.5)
WBC: 5.6 K/uL (ref 4.0–10.5)
nRBC: 0 % (ref 0.0–0.2)

## 2019-04-01 LAB — CEA: CEA: 9.7 ng/mL — ABNORMAL HIGH (ref 0.0–4.7)

## 2019-04-04 IMAGING — CT CT CHEST W/O CM
1 series · 15 of 34 positions shown, 19 images · non-contrast
Comparison: 07/03/2017.

CLINICAL DATA: Lung nodule, colon cancer.

EXAM:
CT CHEST WITHOUT CONTRAST
TECHNIQUE: Multidetector CT imaging of the chest was performed following the
standard protocol without IV contrast.

[Series 2: thorax · axial · 0.66mm/px · z∈[-315,-19]mm · 15 of 174 slices shown, 19 images]
[im 13/174  mediastinal]
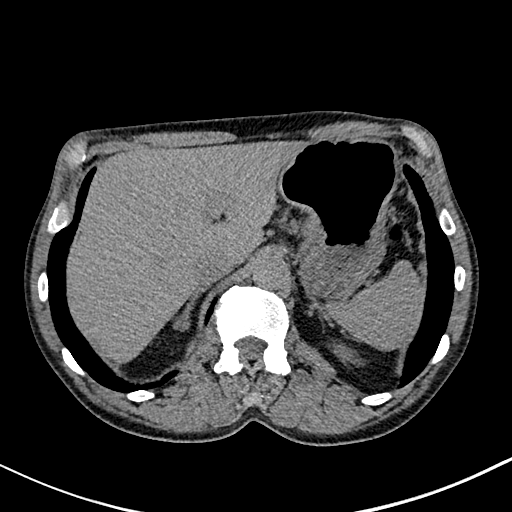
[im 13/174  lung]
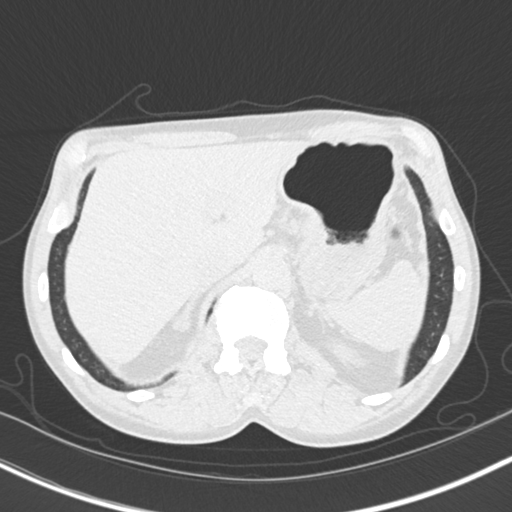
[im 26/174  lung]
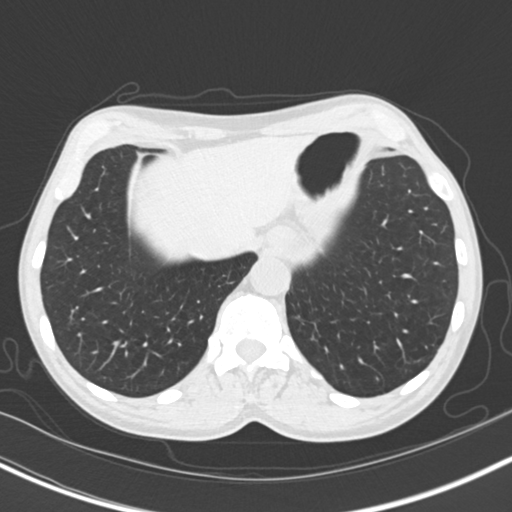
[im 35/174  lung]
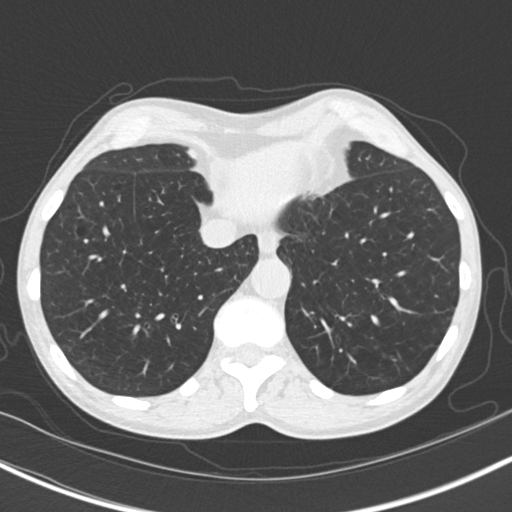
[im 45/174  lung]
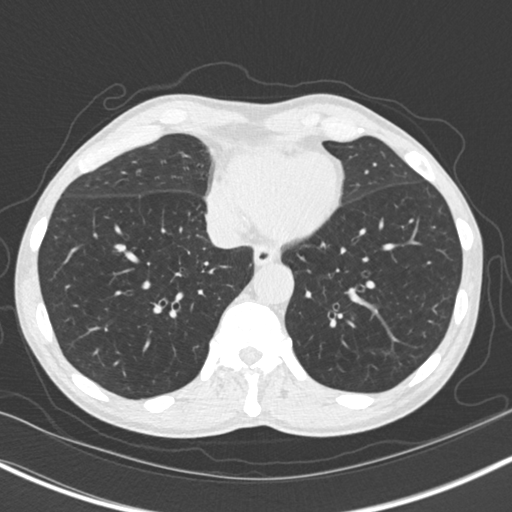
[im 58/174  mediastinal]
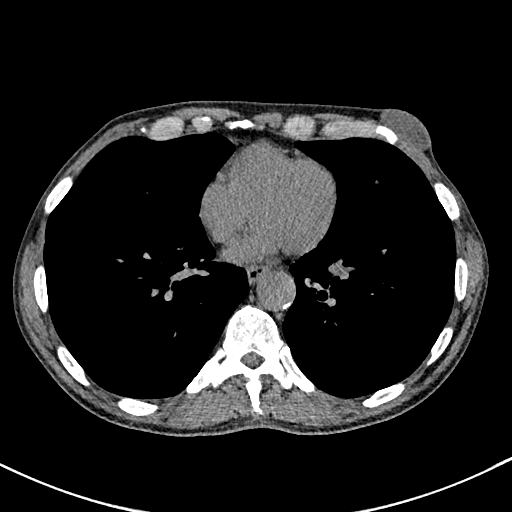
[im 58/174  lung]
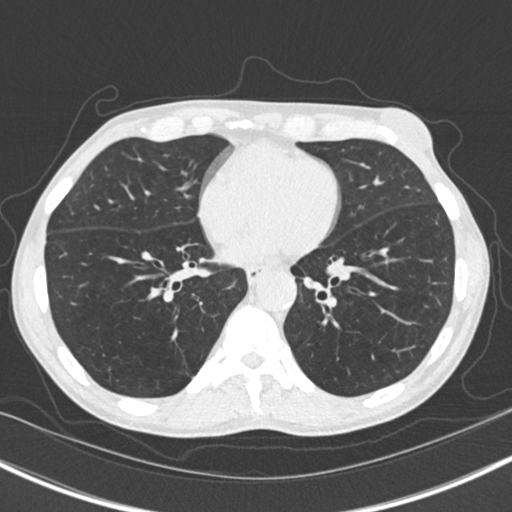
[im 70/174  lung]
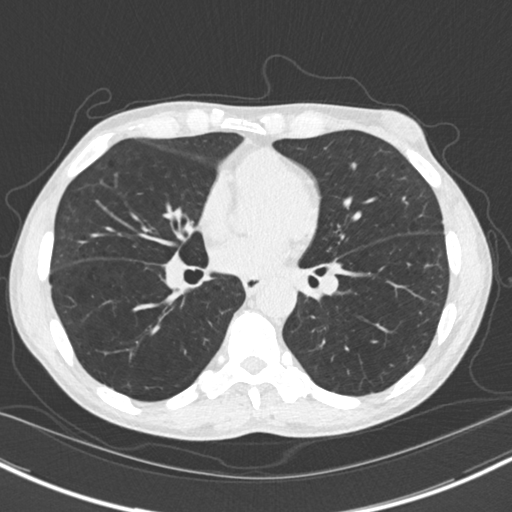
[im 77/174  lung]
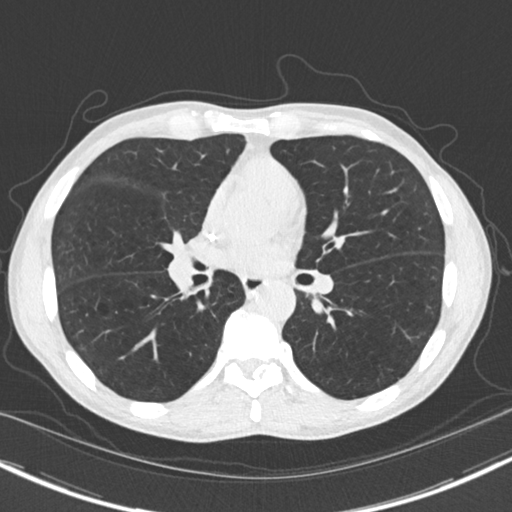
[im 90/174  lung]
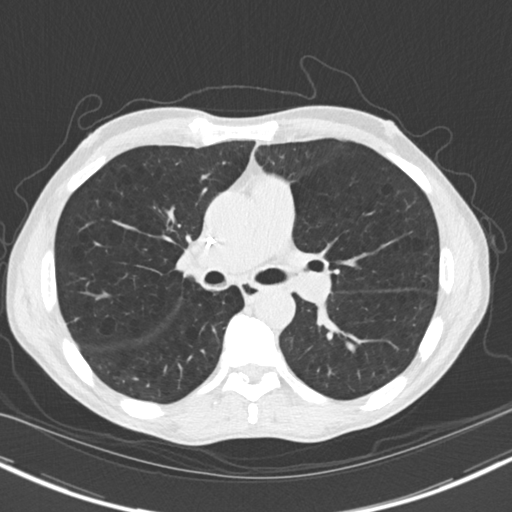
[im 97/174  mediastinal]
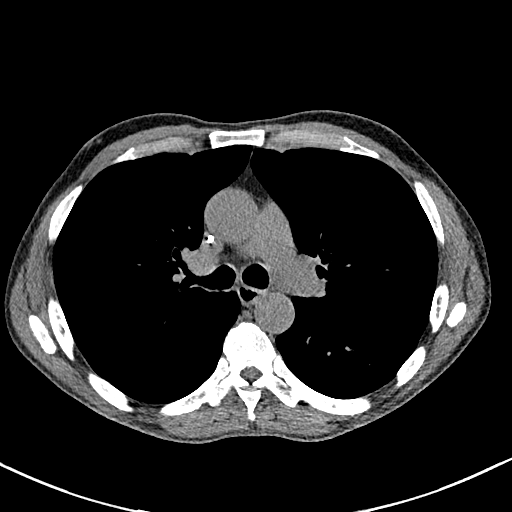
[im 97/174  lung]
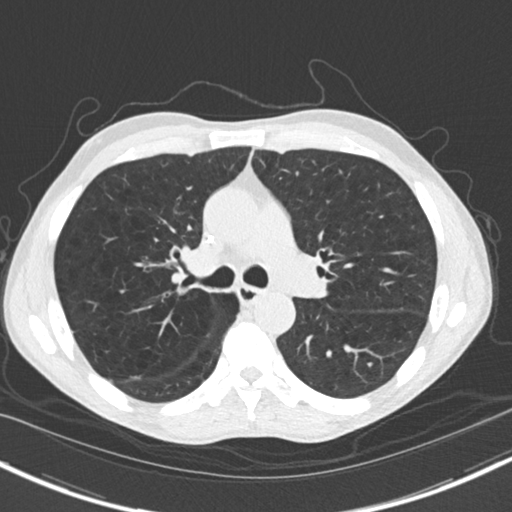
[im 104/174  lung]
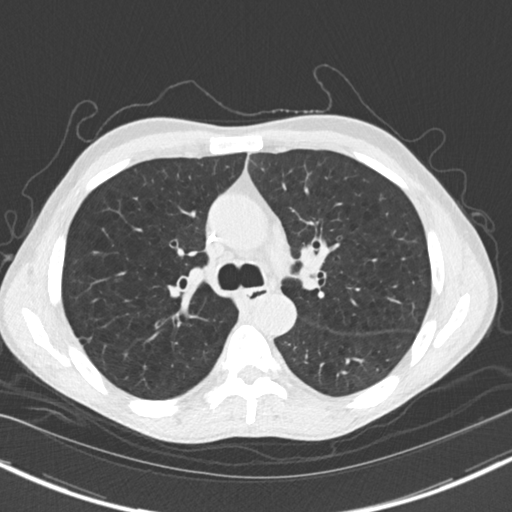
[im 116/174  lung]
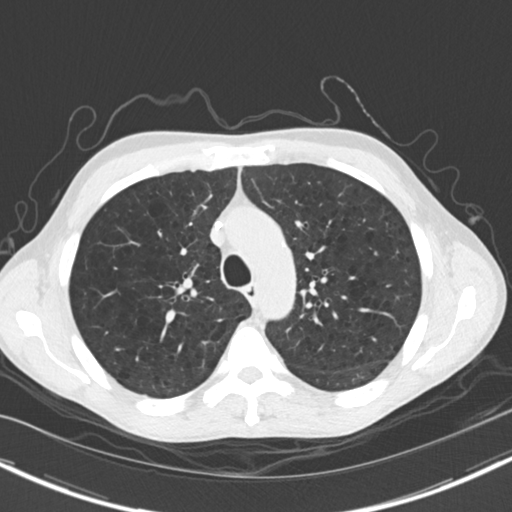
[im 129/174  lung]
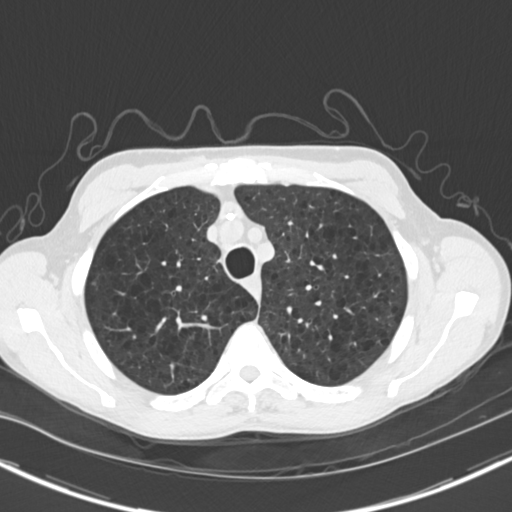
[im 139/174  mediastinal]
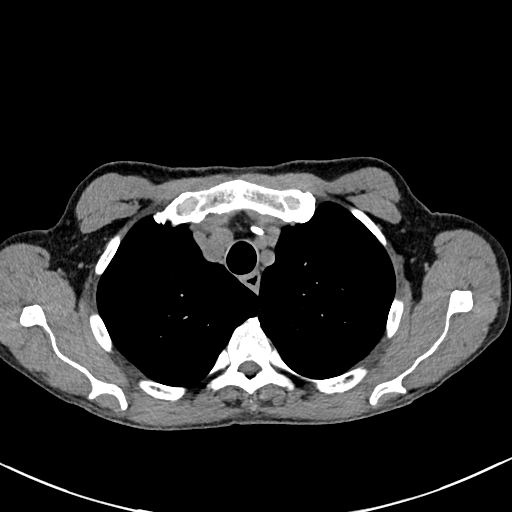
[im 139/174  lung]
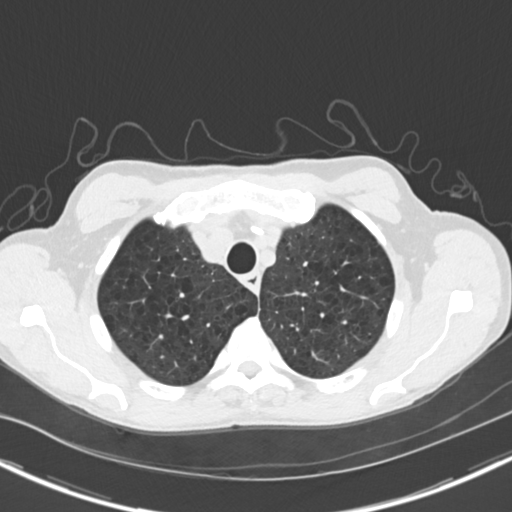
[im 148/174  lung]
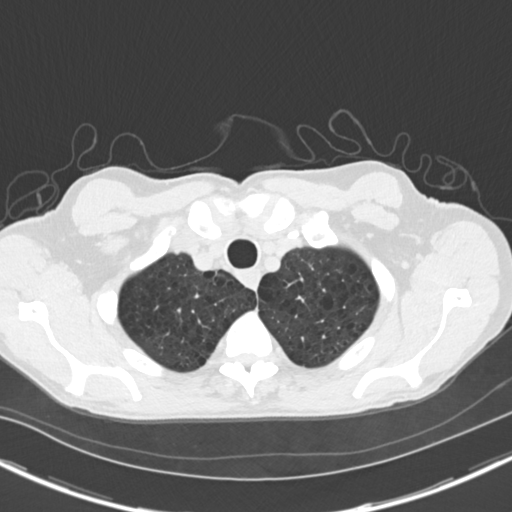
[im 161/174  lung]
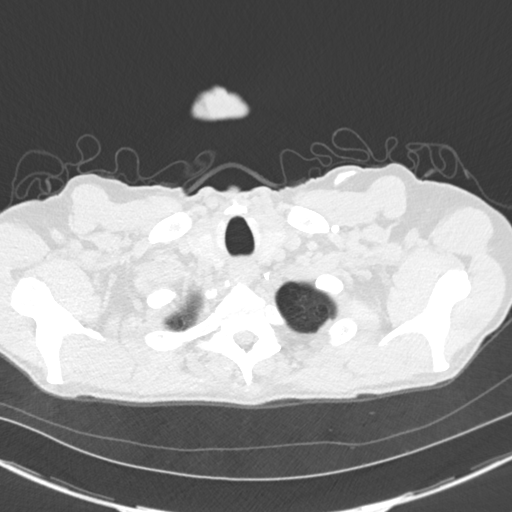

[15 of 34 positions shown; findings below may reference images not displayed]

FINDINGS: Cardiovascular: Left-sided Port-A-Cath terminates at the SVC RA
junction. Atherosclerotic calcification of the arterial vasculature,
including coronary arteries. Heart size normal. No pericardial
effusion.

Mediastinum/Nodes: Mediastinal lymph nodes are not enlarged by CT
size criteria. Hilar regions are difficult to evaluate without IV
contrast. No axillary adenopathy. Esophagus is grossly unremarkable.

Lungs/Pleura: Centrilobular emphysema. 2 mm subpleural nodule in the
medial right upper lobe is unchanged. Previously seen ground-glass
nodular densities throughout the lungs have resolved in the
interval. Minimal subpleural scarring in the posterior segment right
upper lobe. No pleural fluid. Debris is seen dependently in the
mainstem bronchi. Airway is otherwise unremarkable.

Upper Abdomen: Subcentimeter low-attenuation lesion in the right
hepatic lobe is too small to characterize. Visualized portions of
the liver, adrenal glands, kidneys, spleen, pancreas stomach are
grossly unremarkable. Postoperative changes are seen in the colon.

Musculoskeletal: No worrisome lytic or sclerotic lesions.
IMPRESSION: 1. Previously seen bilateral pulmonary nodular densities have
resolved in the interval.
2. Aortic atherosclerosis (QXUYY-170.0). Coronary artery
calcification.
3.  Emphysema ([id number]-SAY.9).

## 2019-04-27 ENCOUNTER — Other Ambulatory Visit: Payer: Self-pay

## 2019-04-28 ENCOUNTER — Inpatient Hospital Stay: Payer: Medicare Other | Attending: Hematology and Oncology

## 2019-04-28 ENCOUNTER — Inpatient Hospital Stay: Payer: Medicare Other

## 2019-04-28 VITALS — BP 99/65 | HR 114 | Temp 97.4°F | Resp 18

## 2019-04-28 DIAGNOSIS — D751 Secondary polycythemia: Secondary | ICD-10-CM | POA: Insufficient documentation

## 2019-04-28 DIAGNOSIS — Z85038 Personal history of other malignant neoplasm of large intestine: Secondary | ICD-10-CM | POA: Insufficient documentation

## 2019-04-28 DIAGNOSIS — C182 Malignant neoplasm of ascending colon: Secondary | ICD-10-CM

## 2019-04-28 LAB — CBC
HCT: 51.8 % (ref 39.0–52.0)
Hemoglobin: 17 g/dL (ref 13.0–17.0)
MCH: 30.7 pg (ref 26.0–34.0)
MCHC: 32.8 g/dL (ref 30.0–36.0)
MCV: 93.7 fL (ref 80.0–100.0)
Platelets: 186 10*3/uL (ref 150–400)
RBC: 5.53 MIL/uL (ref 4.22–5.81)
RDW: 13.9 % (ref 11.5–15.5)
WBC: 7.3 10*3/uL (ref 4.0–10.5)
nRBC: 0 % (ref 0.0–0.2)

## 2019-04-28 NOTE — Patient Instructions (Signed)

## 2019-04-28 NOTE — Progress Notes (Signed)
Hematorcrit is 51.8 today.  Phlebotomy of 250cc performed and patient tolerated well.  Nutritional supplements given for patient to take home.

## 2019-04-29 LAB — CEA: CEA: 9.7 ng/mL — ABNORMAL HIGH (ref 0.0–4.7)

## 2019-06-22 ENCOUNTER — Inpatient Hospital Stay: Payer: Medicare Other | Attending: Hematology and Oncology

## 2019-06-22 ENCOUNTER — Other Ambulatory Visit: Payer: Self-pay

## 2019-06-22 ENCOUNTER — Inpatient Hospital Stay: Payer: Medicare Other

## 2019-06-22 DIAGNOSIS — D751 Secondary polycythemia: Secondary | ICD-10-CM | POA: Insufficient documentation

## 2019-06-22 DIAGNOSIS — C182 Malignant neoplasm of ascending colon: Secondary | ICD-10-CM

## 2019-06-22 DIAGNOSIS — Z85038 Personal history of other malignant neoplasm of large intestine: Secondary | ICD-10-CM | POA: Diagnosis present

## 2019-06-22 LAB — CBC
HCT: 46 % (ref 39.0–52.0)
Hemoglobin: 14.8 g/dL (ref 13.0–17.0)
MCH: 30.8 pg (ref 26.0–34.0)
MCHC: 32.2 g/dL (ref 30.0–36.0)
MCV: 95.6 fL (ref 80.0–100.0)
Platelets: 201 10*3/uL (ref 150–400)
RBC: 4.81 MIL/uL (ref 4.22–5.81)
RDW: 13.5 % (ref 11.5–15.5)
WBC: 7.6 10*3/uL (ref 4.0–10.5)
nRBC: 0 % (ref 0.0–0.2)

## 2019-06-22 NOTE — Progress Notes (Signed)
HCT is 46 today. No phlebotomy needed today.

## 2019-06-23 ENCOUNTER — Inpatient Hospital Stay: Payer: Medicare Other

## 2019-08-12 ENCOUNTER — Other Ambulatory Visit: Payer: Self-pay

## 2019-08-12 DIAGNOSIS — C182 Malignant neoplasm of ascending colon: Secondary | ICD-10-CM
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
## 2019-08-18 ENCOUNTER — Inpatient Hospital Stay: Payer: Medicare Other | Attending: Hematology and Oncology | Admitting: Hematology and Oncology

## 2019-08-18 ENCOUNTER — Encounter: Payer: Self-pay | Admitting: Hematology and Oncology

## 2019-08-18 ENCOUNTER — Other Ambulatory Visit: Payer: Self-pay

## 2019-08-18 ENCOUNTER — Inpatient Hospital Stay: Payer: Medicare Other

## 2019-08-18 VITALS — BP 137/76 | HR 95 | Temp 97.5°F | Resp 16 | Wt 137.6 lb

## 2019-08-18 DIAGNOSIS — D751 Secondary polycythemia: Secondary | ICD-10-CM | POA: Insufficient documentation

## 2019-08-18 DIAGNOSIS — F17210 Nicotine dependence, cigarettes, uncomplicated: Secondary | ICD-10-CM | POA: Insufficient documentation

## 2019-08-18 DIAGNOSIS — C182 Malignant neoplasm of ascending colon: Secondary | ICD-10-CM | POA: Insufficient documentation

## 2019-08-18 DIAGNOSIS — R970 Elevated carcinoembryonic antigen [CEA]: Secondary | ICD-10-CM | POA: Insufficient documentation

## 2019-08-18 LAB — COMPREHENSIVE METABOLIC PANEL
ALT: 13 U/L (ref 0–44)
AST: 23 U/L (ref 15–41)
Albumin: 4.3 g/dL (ref 3.5–5.0)
Alkaline Phosphatase: 72 U/L (ref 38–126)
Anion gap: 9 (ref 5–15)
BUN: 19 mg/dL (ref 8–23)
CO2: 24 mmol/L (ref 22–32)
Calcium: 9.1 mg/dL (ref 8.9–10.3)
Chloride: 100 mmol/L (ref 98–111)
Creatinine, Ser: 1.11 mg/dL (ref 0.61–1.24)
GFR calc Af Amer: >60 mL/min (ref >60)
GFR calc non Af Amer: >60 mL/min (ref >60)
Glucose, Bld: 103 mg/dL — ABNORMAL HIGH (ref 70–99)
Potassium: 4.1 mmol/L (ref 3.5–5.1)
Sodium: 133 mmol/L — ABNORMAL LOW (ref 135–145)
Total Bilirubin: 0.7 mg/dL (ref 0.3–1.2)
Total Protein: 7.7 g/dL (ref 6.5–8.1)

## 2019-08-18 LAB — CBC WITH DIFFERENTIAL/PLATELET
Abs Immature Granulocytes: 0.03 10*3/uL (ref 0.00–0.07)
Basophils Absolute: 0.1 10*3/uL (ref 0.0–0.1)
Basophils Relative: 1 %
Eosinophils Absolute: 0.1 10*3/uL (ref 0.0–0.5)
Eosinophils Relative: 1 %
HCT: 46.2 % (ref 39.0–52.0)
Hemoglobin: 14.7 g/dL (ref 13.0–17.0)
Immature Granulocytes: 0 %
Lymphocytes Relative: 30 %
Lymphs Abs: 2.6 10*3/uL (ref 0.7–4.0)
MCH: 29.8 pg (ref 26.0–34.0)
MCHC: 31.8 g/dL (ref 30.0–36.0)
MCV: 93.7 fL (ref 80.0–100.0)
Monocytes Absolute: 0.5 10*3/uL (ref 0.1–1.0)
Monocytes Relative: 6 %
Neutro Abs: 5.3 10*3/uL (ref 1.7–7.7)
Neutrophils Relative %: 62 %
Platelets: 198 10*3/uL (ref 150–400)
RBC: 4.93 MIL/uL (ref 4.22–5.81)
RDW: 13.3 % (ref 11.5–15.5)
WBC: 8.6 10*3/uL (ref 4.0–10.5)
nRBC: 0 % (ref 0.0–0.2)

## 2019-08-18 LAB — FERRITIN: Ferritin: 12 ng/mL — ABNORMAL LOW (ref 24–336)

## 2019-08-19 LAB — CEA: CEA: 8.6 ng/mL — ABNORMAL HIGH (ref 0.0–4.7)

## 2019-09-19 ENCOUNTER — Inpatient Hospital Stay: Payer: Medicare Other

## 2019-09-19 ENCOUNTER — Other Ambulatory Visit: Payer: Self-pay

## 2019-09-19 ENCOUNTER — Inpatient Hospital Stay: Payer: Medicare Other | Attending: Hematology and Oncology

## 2019-09-19 DIAGNOSIS — Z85038 Personal history of other malignant neoplasm of large intestine: Secondary | ICD-10-CM | POA: Insufficient documentation

## 2019-09-19 DIAGNOSIS — D751 Secondary polycythemia: Secondary | ICD-10-CM | POA: Insufficient documentation

## 2019-09-19 DIAGNOSIS — C182 Malignant neoplasm of ascending colon: Secondary | ICD-10-CM

## 2019-09-19 LAB — HEMOGLOBIN: Hemoglobin: 14.8 g/dL (ref 13.0–17.0)

## 2019-09-19 LAB — HEMATOCRIT: HCT: 46 % (ref 39.0–52.0)

## 2019-09-19 NOTE — Progress Notes (Signed)
HCT 46. No phlebotomy required today.

## 2019-10-17 ENCOUNTER — Other Ambulatory Visit: Payer: Self-pay

## 2019-10-17 ENCOUNTER — Inpatient Hospital Stay: Payer: Medicare Other | Attending: Hematology and Oncology

## 2019-10-17 ENCOUNTER — Inpatient Hospital Stay: Payer: Medicare Other

## 2019-10-17 ENCOUNTER — Telehealth: Payer: Self-pay

## 2019-10-17 DIAGNOSIS — C182 Malignant neoplasm of ascending colon: Secondary | ICD-10-CM

## 2019-10-17 DIAGNOSIS — Z85038 Personal history of other malignant neoplasm of large intestine: Secondary | ICD-10-CM | POA: Diagnosis present

## 2019-10-17 DIAGNOSIS — R7989 Other specified abnormal findings of blood chemistry: Secondary | ICD-10-CM | POA: Insufficient documentation

## 2019-10-17 DIAGNOSIS — D751 Secondary polycythemia: Secondary | ICD-10-CM | POA: Diagnosis present

## 2019-10-17 LAB — COMPREHENSIVE METABOLIC PANEL
ALT: 14 U/L (ref 0–44)
AST: 27 U/L (ref 15–41)
Albumin: 4.2 g/dL (ref 3.5–5.0)
Alkaline Phosphatase: 66 U/L (ref 38–126)
Anion gap: 8 (ref 5–15)
BUN: 13 mg/dL (ref 8–23)
CO2: 27 mmol/L (ref 22–32)
Calcium: 9.5 mg/dL (ref 8.9–10.3)
Chloride: 102 mmol/L (ref 98–111)
Creatinine, Ser: 1.26 mg/dL — ABNORMAL HIGH (ref 0.61–1.24)
GFR calc Af Amer: >60 mL/min (ref >60)
GFR calc non Af Amer: 59 mL/min — ABNORMAL LOW (ref >60)
Glucose, Bld: 102 mg/dL — ABNORMAL HIGH (ref 70–99)
Potassium: 5.5 mmol/L — ABNORMAL HIGH (ref 3.5–5.1)
Sodium: 137 mmol/L (ref 135–145)
Total Bilirubin: 0.6 mg/dL (ref 0.3–1.2)
Total Protein: 7.9 g/dL (ref 6.5–8.1)

## 2019-10-17 LAB — HEMOGLOBIN: Hemoglobin: 15.3 g/dL (ref 13.0–17.0)

## 2019-10-17 LAB — HEMATOCRIT: HCT: 48.7 % (ref 39.0–52.0)

## 2019-10-17 NOTE — Telephone Encounter (Signed)
Spoke with the PCP office which she inform me that the Dr 's are gone for the day and it will be reveiwed first thing in the morning.

## 2019-10-18 ENCOUNTER — Telehealth: Payer: Self-pay

## 2019-10-18 ENCOUNTER — Inpatient Hospital Stay: Payer: Medicare Other

## 2019-10-18 DIAGNOSIS — C182 Malignant neoplasm of ascending colon: Secondary | ICD-10-CM

## 2019-10-18 DIAGNOSIS — Z85038 Personal history of other malignant neoplasm of large intestine: Secondary | ICD-10-CM | POA: Diagnosis not present

## 2019-10-18 LAB — POTASSIUM: Potassium: 4.4 mmol/L (ref 3.5–5.1)

## 2019-10-18 LAB — CEA: CEA: 10.5 ng/mL — ABNORMAL HIGH (ref 0.0–4.7)

## 2019-10-18 LAB — FERRITIN: Ferritin: 16 ng/mL — ABNORMAL LOW (ref 24–336)

## 2019-10-18 NOTE — Telephone Encounter (Signed)
-----   Message from Lequita Asal, MD sent at 10/18/2019  1:07 PM EDT ----- Regarding: Please call patient and let him know potassium is normal  ----- Message ----- From: Interface, [facility] [place] Sent: 10/18/2019  11:05 AM EDT To: Lequita Asal, MD

## 2019-10-18 NOTE — Telephone Encounter (Signed)
error 

## 2019-10-18 NOTE — Telephone Encounter (Signed)
Spoke with the patient to inform him that his potassium was normal today. I have also informed the patient PCP office about his potassium.  The patient was understanding and agreeable.

## 2019-10-31 ENCOUNTER — Ambulatory Visit
Admission: RE | Admit: 2019-10-31 | Discharge: 2019-10-31 | Disposition: A | Discharge status: Home / Self Care | Payer: Medicare Other | Source: Ambulatory Visit | Attending: Hematology and Oncology | Admitting: Hematology and Oncology

## 2019-10-31 ENCOUNTER — Other Ambulatory Visit: Payer: Self-pay

## 2019-10-31 DIAGNOSIS — C182 Malignant neoplasm of ascending colon: Secondary | ICD-10-CM | POA: Diagnosis present

## 2019-10-31 MED ORDER — IOHEXOL 300 MG/ML  SOLN
100.0000 mL | Freq: Once | INTRAMUSCULAR | Status: AC | PRN
  Administered 2019-10-31: 100 mL via INTRAVENOUS

## 2019-11-21 ENCOUNTER — Ambulatory Visit: Payer: Medicare Other | Admitting: Hematology and Oncology

## 2019-11-21 ENCOUNTER — Other Ambulatory Visit: Payer: Medicare Other

## 2019-12-13 NOTE — Progress Notes (Signed)
[facility]  [address], [address] [place], [place] [address] Phone: [phone]  Fax: [phone]   Clinic Day:  12/14/2019  Referring physician: Jodi Marble, MD  Chief Complaint: William Ford is a 69 y.o. male with stage IIB colon cancer and polycythemia who is seen for 4 month assessment.   HPI: The patient was last seen in the medical oncology clinic on 08/18/2019. At that time, he feels good.  He is cutting back on smoking.  Exam is stable. Hematocrit was 46.2, hemoglobin 14.7, platelets 198,000, WBC 8,600. Sodium was 133. Ferritin was 12. CEA was 8.6. We discussed continued surveillance.  Chest, abdomen, and pelvis CT on 10/31/2019 revealed no evidence of recurrent or metastatic disease. The LEFT chest area of water density with ovoid characteristics had enlarged slightly since 2019 measuring 4.4 cm as compared to 4.1 cm. This was slowly enlarging over time. This was well-circumscribed and remained of uncertain significance. Correlate with any history of trauma or other history to correspond with the development of this lesion. Given the persistent elevation of CEA, focused ultrasound evaluation and potential aspiration based on characteristics exhibited on ultrasound could be considered, despite the fact that this is a highly unusual site for metastatic disease from colon cancer particularly as the only site of disease and that prior PET imaging has shown this to be PET negative. There was marked pulmonary emphysema without consolidation or pleural effusion. There was emphysema and aortic atherosclerosis.  Labs: 09/19/2019: Hematocrit 46.0. Hemoglobin 14.8.  10/17/2019: Hematocrit 48.7. Hemoglobin 15.3. Potassium 5.5. Creatinine 1.26. Ferritin 16. CEA 10.5.  During the interim, he has been "about the same." He smokes half of a pack of cigarettes daily and is trying to cut back. He denies bleeding of any kind. 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    The knot on the left side of his chest has been there for a long time. It went away for a while but came back right before he was diagnosed with cancer. He states that it doesn't change in size. He denies any trauma to the area. He has had a few similar areas on his face that have been removed.   Past Medical History:  Diagnosis Date  . Arthritis    hands  . Colon cancer ([place])   . Emphysema of lung ([facility])    mild (per pt)  . Hypertension   . Numbness    toes and fingers (S/P chemo - per pt)    Past Surgical History:  Procedure Laterality Date  . AMPUTATION Right 2003   4th   . CATARACT EXTRACTION W/PHACO Right 02/02/2017   Procedure: CATARACT EXTRACTION PHACO AND INTRAOCULAR LENS PLACEMENT ([place]) RIGHT;  Surgeon: Leandrew Koyanagi, MD;  Location: [facility];  Service: Ophthalmology;  Laterality: Right;  . CATARACT EXTRACTION W/PHACO Left 02/25/2017   Procedure: CATARACT EXTRACTION PHACO AND INTRAOCULAR LENS PLACEMENT ([place]) left;  Surgeon: Leandrew Koyanagi, MD;  Location: [facility];  Service: Ophthalmology;  Laterality: Left;  . COLON SURGERY  2013  . COLONOSCOPY    . COLONOSCOPY WITH PROPOFOL N/A 07/16/2018   Procedure: COLONOSCOPY WITH PROPOFOL;  Surgeon: Lollie Sails, MD;  Location: [facility] ENDOSCOPY;  Service: Endoscopy;  Laterality: N/A;  . ESOPHAGOGASTRODUODENOSCOPY (EGD) WITH PROPOFOL N/A 07/16/2018   Procedure: ESOPHAGOGASTRODUODENOSCOPY (EGD) WITH PROPOFOL;  Surgeon: Lollie Sails, MD;  Location: [facility] ENDOSCOPY;  Service: Endoscopy;  Laterality: N/A;    Family History  Problem Relation Age of Onset  .  Cancer Maternal Grandfather     Social History:  reports that he has been smoking cigarettes. He has a 22.50 pack-year smoking history. He has never used smokeless tobacco. He reports current alcohol use of about 2.0 standard drinks of alcohol per week. He reports that he does not use drugs. He started smoking at age 52. He has stopped  smoking several times. He was smoking 1pack/week. He is now smoking less than half a pack a day.He is trying to cut back. He lives in [place]. The patient has a "little sweet thing" (girlfriend since December). The patient is alone today.  Allergies: No Known Allergies  Current Medications: Current Outpatient Medications  Medication Sig Dispense Refill  . CELEBREX 200 MG capsule Take 200 mg by mouth daily.   0  . cetirizine (ZYRTEC) 10 MG tablet Take 1 tablet by mouth daily.    . cyclobenzaprine (FLEXERIL) 5 MG tablet Take 1 tablet (5 mg total) by mouth 3 (three) times daily as needed for muscle spasms. 30 tablet 0  . fluticasone (FLONASE) 50 MCG/ACT nasal spray Place 1 spray into both nostrils daily.    Marland Kitchen gabapentin (NEURONTIN) 300 MG capsule Take 1 capsule by mouth daily.   0  . L-Methylfolate-Algae-B12-B6 (FOLTANX RF) 3-90.314-2-35 MG CAPS Take 1 tablet by mouth daily.   0  . lisinopril (PRINIVIL,ZESTRIL) 20 MG tablet Take 20 mg by mouth daily.  0  . LYRICA 75 MG capsule take 1 capsule by mouth twice a day 60 capsule 3  . Na Sulfate-K Sulfate-Mg Sulf 17.5-3.13-1.6 GM/177ML SOLN Take by mouth daily.    Marland Kitchen omeprazole (PRILOSEC) 20 MG capsule Take 20 mg by mouth daily.    . sildenafil (VIAGRA) 100 MG tablet SMARTSIG:0.5 Tablet(s) By Mouth    . sildenafil (REVATIO) 20 MG tablet Take 2 tablets by mouth as needed. (Patient not taking: Reported on 12/14/2019)  0   No current facility-administered medications for this visit.   Facility-Administered Medications Ordered in Other Visits  Medication Dose Route Frequency Provider Last Rate Last Admin  . heparin lock flush 100 unit/mL  500 Units Intravenous Once Ithan Touhey C, MD      . sodium chloride 0.9 % injection 10 mL  10 mL Intravenous PRN Nolon Stalls C, MD   10 mL at 12/13/14 1043  . sodium chloride flush (NS) 0.9 % injection 10 mL  10 mL Intravenous PRN Nolon Stalls C, MD   10 mL at 08/20/16 1050  . sodium chloride flush  (NS) 0.9 % injection 10 mL  10 mL Intravenous PRN Nolon Stalls C, MD   10 mL at 02/03/18 1020  . sodium chloride flush (NS) 0.9 % injection 10 mL  10 mL Intravenous PRN Lequita Asal, MD        Review of Systems  Constitutional: Positive for weight loss (1 lb). Negative for chills, diaphoresis, fever and malaise/fatigue.       Feels "the same."  HENT: Negative.  Negative for congestion, ear discharge, ear pain, hearing loss, nosebleeds, sinus pain, sore throat and tinnitus.   Eyes: Negative.  Negative for blurred vision, double vision, photophobia and pain.  Respiratory: Negative for cough, hemoptysis, sputum production and shortness of breath.        Smoking 1/2 pack per day.  Cardiovascular: Negative.  Negative for chest pain, palpitations, orthopnea, leg swelling and PND.  Gastrointestinal: Negative.  Negative for abdominal pain, blood in stool, constipation, diarrhea, heartburn, melena, nausea and vomiting.  Normal bowels.  Genitourinary: Negative.  Negative for dysuria, frequency, hematuria and urgency.  Musculoskeletal: Negative.  Negative for back pain, falls, joint pain, myalgias and neck pain.  Skin: Negative.  Negative for itching and rash.  Neurological: Positive for sensory change (neuropathy in hands, worse at night). Negative for dizziness, tingling, speech change, weakness and headaches.  Endo/Heme/Allergies: Negative.  Does not bruise/bleed easily.  Psychiatric/Behavioral: Negative.  Negative for depression and memory loss. The patient is not nervous/anxious and does not have insomnia.   All other systems reviewed and are negative.  Performance status (ECOG): 0  Vitals Blood pressure 115/69, pulse 96, temperature (!) 95.6 F (35.3 C), temperature source Tympanic, resp. rate 18, weight 136 lb 11 oz (62 kg), SpO2 99 %.   Physical Exam Vitals and nursing note reviewed.  Constitutional:      General: He is not in acute distress.    Appearance: He is  well-developed. He is not diaphoretic.  HENT:     Head: Normocephalic and atraumatic.     Mouth/Throat:     Pharynx: No oropharyngeal exudate.  Eyes:     General: No scleral icterus.    Conjunctiva/sclera: Conjunctivae normal.     Pupils: Pupils are equal, round, and reactive to light.     Comments: Glasses. Brown eyes.  Cardiovascular:     Rate and Rhythm: Normal rate and regular rhythm.     Heart sounds: Normal heart sounds. No murmur heard.   Pulmonary:     Effort: Pulmonary effort is normal. No respiratory distress.     Breath sounds: Normal breath sounds. No wheezing or rales.  Abdominal:     General: Bowel sounds are normal. There is no distension.     Palpations: Abdomen is soft. There is no hepatomegaly, splenomegaly or mass.     Tenderness: There is no abdominal tenderness. There is no guarding or rebound.  Musculoskeletal:        General: No tenderness. Normal range of motion.     Cervical back: Normal range of motion and neck supple.     Comments: 4.5 x 4.5 cm fluid filled area on left chest. Soft. Thick. Not tense (see photo).  2 cm area on left cheek.  Lymphadenopathy:     Head:     Right side of head: No preauricular, posterior auricular or occipital adenopathy.     Left side of head: No preauricular, posterior auricular or occipital adenopathy.     Cervical: No cervical adenopathy.     Upper Body:     Right upper body: No supraclavicular or axillary adenopathy.     Left upper body: No supraclavicular or axillary adenopathy.     Lower Body: No right inguinal adenopathy. No left inguinal adenopathy.  Skin:    General: Skin is warm and dry.     Coloration: Skin is not pale.     Findings: No erythema or rash.  Neurological:     Mental Status: He is alert and oriented to person, place, and time.     Deep Tendon Reflexes: Reflexes are normal and symmetric.  Psychiatric:        Behavior: Behavior normal.        Thought Content: Thought content normal.         Judgment: Judgment normal.      12/14/2019    Imaging studies: 07/03/2014:  Chest, abdomen, and pelvis CT revealed no evidence of metastatic disease. There was a new irregular shaped pleural based pulmonary nodule in the  periphery of the RUL most likely infectious/inflammatory. 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  10/20/2014:  Chest CTrevealed a stable small pleural-based area of nodularity in the right upper lobe (favor benign area of scarring). There was mild diffuse bronchial wall thickening with moderate centrilobular and mild paraseptal emphysema; imaging findings suggestive of underlying COPD.  07/19/2015:  Chest CTrevealed no acute cardiopulmonary abnormalities. He has a peripheral scar like density within the right upper lobe.  07/03/2016:  Chest, abdomen, and pelvis CTon 07/03/2016 revealed slight enlargement of the right upper lobe subpleural pulmonary nodule (3 mm to 4.6 mm). There was persistent irregularity of the gallbladder wall. There was a stable 12 mm right adrenal nodule. There was mild enlargement of the prostate gland. There was calcific atherosclerotic disease of the aorta with persistentnear complete occlusion of the proximal superficial femoral arteriesbilaterally.  01/14/2017:  Chest CT revealed an interval decrease in medial right upper lobe pulmonary nodule from 5 mm on prior study to 2 mm on the current exam. There was a 1-2 mm right lower lobe pulmonary nodule is stable since 10/20/2014 consistent with benign process.  07/03/2017:  Chest, abdomen, and pelvis CTrevealed no evidence of recurrent or metastatic carcinoma within the abdomen or pelvis. There was a stable 1.2 cm right adrenal nodule noted. Stable 2 mm right upper and lower lobe pulmonary nodules. Several new ground glass nodules in both upper lobes, with the largest measuring 9 mm, and felt to be likely of inflammatory or infectious etiology. Follow-up chest CT recommended in 6  months. 01/11/2018:  Chest CTrevealed the previously seen bilateral pulmonary nodular densities had resolved. 07/06/2018:  AbdomenandpelvisCTrevealed no findings of recurrent or metastatic disease.   Appointment on 12/14/2019  Component Date Value Ref Range Status  . Sodium 12/14/2019 137  135 - 145 mmol/L Final  . Potassium 12/14/2019 3.8  3.5 - 5.1 mmol/L Final  . Chloride 12/14/2019 103  98 - 111 mmol/L Final  . CO2 12/14/2019 25  22 - 32 mmol/L Final  . Glucose, Bld 12/14/2019 124* 70 - 99 mg/dL Final   Glucose reference range applies only to samples taken after fasting for at least 8 hours.  . BUN 12/14/2019 11  8 - 23 mg/dL Final  . Creatinine, Ser 12/14/2019 1.19  0.61 - 1.24 mg/dL Final  . Calcium 12/14/2019 9.3  8.9 - 10.3 mg/dL Final  . Total Protein 12/14/2019 7.8  6.5 - 8.1 g/dL Final  . Albumin 12/14/2019 4.2  3.5 - 5.0 g/dL Final  . AST 12/14/2019 24  15 - 41 U/L Final  . ALT 12/14/2019 12  0 - 44 U/L Final  . Alkaline Phosphatase 12/14/2019 60  38 - 126 U/L Final  . Total Bilirubin 12/14/2019 0.4  0.3 - 1.2 mg/dL Final  . GFR calc non Af Amer 12/14/2019 >60  >60 mL/min Final  . GFR calc Af Amer 12/14/2019 >60  >60 mL/min Final  . Anion gap 12/14/2019 9  5 - 15 Final   Performed at [facility], [address], [place], [place] [address]  . WBC 12/14/2019 8.0  4.0 - 10.5 K/uL Final  . RBC 12/14/2019 5.19  4.22 - 5.81 MIL/uL Final  . Hemoglobin 12/14/2019 15.5  13.0 - 17.0 g/dL Final  . HCT 12/14/2019 49.0  39 - 52 % Final  . MCV 12/14/2019 94.4  80.0 - 100.0 fL Final  . MCH 12/14/2019 29.9  26.0 - 34.0 pg Final  . MCHC 12/14/2019 31.6  30.0 - 36.0  g/dL Final  . RDW 12/14/2019 15.0  11.5 - 15.5 % Final  . Platelets 12/14/2019 121* 150 - 400 K/uL Final  . nRBC 12/14/2019 0.0  0.0 - 0.2 % Final  . Neutrophils Relative % 12/14/2019 59  % Final  . Neutro Abs 12/14/2019 4.8  1.7 - 7.7 K/uL Final  . Lymphocytes Relative 12/14/2019 30  % Final  . Lymphs  Abs 12/14/2019 2.4  0.7 - 4.0 K/uL Final  . Monocytes Relative 12/14/2019 9  % Final  . Monocytes Absolute 12/14/2019 0.7  0 - 1 K/uL Final  . Eosinophils Relative 12/14/2019 1  % Final  . Eosinophils Absolute 12/14/2019 0.1  0 - 0 K/uL Final  . Basophils Relative 12/14/2019 1  % Final  . Basophils Absolute 12/14/2019 0.1  0 - 0 K/uL Final  . Immature Granulocytes 12/14/2019 0  % Final  . Abs Immature Granulocytes 12/14/2019 0.03  0.00 - 0.07 K/uL Final   Performed at [facility], [address], [place], [place] [address]    Assessment:  William Ford is a 69 y.o. male with stage IIB colon cancer. He underwent right colectomy on 11/05/2012. Pathology revealed a T3N0M0 tumor in the cecum with 17 negative lymph nodes. Circumferential margin was positive. There was lymphovascular invasion. He had 1 deposit of metastases in peritoneum. Mismatch repair was positive.  He received 12 cycles of FOLFOXchemotherapy from 12/31/2012 - 08/19/2013. Chemotherapy was interrupted because of small bowel obstruction due to adhesions in 05/2013. 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  He has an elevated CEA with negative imaging studies. CEA was 10.7 on 12/31/2012, 13.4 on 06/17/2013, 11.7 on 07/18/2013, 8.3 on 09/28/2013, 8.0 on 12/30/2013, 9.6 on 05/17/2014, 9.8 on 10/25/2014, 10.3 on 05/09/2015, 10.4 on 07/25/2015, 8.4 on 08/08/2015, 8.8 on 11/07/2015, 8.3 on 02/06/2016, 18.8 on 05/28/2016, 8.8 on 08/20/2016, 8.9 on 01/07/2017, 8.7 on 04/29/2017, 8.5 on 06/24/2017, 7.5 on 09/02/2017, 10.0 on 01/06/2018, 8.6 on 02/03/2018, 8.2 on 04/28/2018, 9.7 on 03/31/2019, 9.7 on 04/28/2019, 8.6 on 08/18/2019, 10.5 on 10/17/2019, and 9.3 on 12/14/2019.   Colonoscopy on 07/16/2018 revealed 5 polyps range from 2-95m. Pathology showed tubular adenoma and hyperplastic polyps.EGDon 07/16/2018 revealed esophageal plaquesc/w Candidiasis,LA Grade B erosive esophagitis, atrophic  gastritis,andduodenitis.  Chest, abdomen, and pelvis CTon 07/03/2017 revealed no evidence of recurrent or metastatic carcinoma within the abdomen or pelvis. There was a stable 1.2 cm right adrenal nodule. There were stable 2 mm right upper and lower lobe pulmonary nodules. Several new ground glass nodules in both upper lobes, with the largest measuring 9 mm, and felt to be likely of inflammatory or infectious etiology.  Chest CTon 01/11/2018 revealed the previously seen bilateral pulmonary nodular densities had resolved.  AbdomenandpelvisCTon04/09/2018 revealed no findings of recurrent or metastatic disease.Chest, abdomen, and pelvis CT on 10/31/2019 revealed no evidence of recurrent or metastatic disease. The LEFT chest area of water density with ovoid characteristics had enlarged slightly since 2019 measuring 4.4 cm as compared to 4.1 cm. This was slowly enlarging over time. This was well-circumscribed and remained of uncertain significance.  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 Work-up on 07/25/2015 revealed a hematocrit of 57.2, hemoglobin 19.2, MCV 95.1, platelets 169,000, WBC 8300 with an ANC of 4900. Erythropoietin level was 6.0 (2.6-18.5). Testosterone level was 680. JAK2 was negative for V617F and exon 12. Ferritin was 90, iron saturation 24% , and TIBC 372.  He undergoes small volume phlebotomies(250 cc) to maintain a hematocrit <52 and a  hemoglobin <18. With an elevated hematocrit, he feels sluggish/drowsy. He feels better 2 days after his phlebotomies. He underwent phlebotomy (500 cc) on 07/25/2015 with subsequent fatigue. Last phlebotomy was on 04/18/2019.  Patient was vaccinated for COVID-19 around 06/2019.  Symptomatically, he feels "about the same".  He smokes a half a pack of cigarettes a day.  The lesion on his chest left wall is stable  Plan: 1.   Labs today: CBC with diff, CMP, CEA. 2.   Stage IIB colon cancer Clinically, he is doing  well. CEA is 9.3 today. CEA continues to fluctuate: 8.2 - 18.8 since completion of chemotherapy. Chest, abdomen and pelvis CT on 10/31/2019 was personally reviewed.  Agree with radiology interpretation.  No evidence of recurrent disease. Discuss unclear significance of left chest wall fluid-filled lesion.  Patient agreeable to aspirate/biopsy. Continue to monitor. 3.   Secondary polycythemia Hematocrit49.0. Hemoglobin  15.5. Hematocrit goal < 52. No phlebotomy today. He smokes about 1/2 pack a day. Continue to encourage smoking cessation. 4.   Left chest wall aspirate/biopsy on 12/19/2019. 5.   MD to call patient with pathology results. 6.   RTC every other month for labs (HCT/Hgb) and +/- phlebotomy. 7.   RTC in 4 months for MD assessment, labs (CBC with diff, CMP, CEA).  I discussed the assessment and treatment plan with the patient.  The patient was provided an opportunity to ask questions and all were answered.  The patient agreed with the plan and demonstrated an understanding of the instructions.  The patient was advised to call back if the symptoms worsen or if the condition fails to improve as anticipated.   Lequita Asal, MD, PhD    12/14/2019, 10:38 AM  I, Mirian Mo Tufford, am acting as Education administrator for [employer]. Mike Gip, MD, PhD.  I, Karilyn Wind C. Mike Gip, MD, have reviewed the above documentation for accuracy and completeness, and I agree with the above.

## 2019-12-14 ENCOUNTER — Inpatient Hospital Stay (HOSPITAL_BASED_OUTPATIENT_CLINIC_OR_DEPARTMENT_OTHER): Payer: Medicare Other | Admitting: Hematology and Oncology

## 2019-12-14 ENCOUNTER — Other Ambulatory Visit: Payer: Self-pay

## 2019-12-14 ENCOUNTER — Inpatient Hospital Stay: Payer: Medicare Other | Attending: Hematology and Oncology

## 2019-12-14 ENCOUNTER — Telehealth: Payer: Self-pay

## 2019-12-14 ENCOUNTER — Encounter: Payer: Self-pay | Admitting: Hematology and Oncology

## 2019-12-14 ENCOUNTER — Inpatient Hospital Stay: Payer: Medicare Other

## 2019-12-14 VITALS — BP 115/69 | HR 96 | Temp 95.6°F | Resp 18 | Wt 136.7 lb

## 2019-12-14 DIAGNOSIS — C182 Malignant neoplasm of ascending colon: Secondary | ICD-10-CM | POA: Diagnosis not present

## 2019-12-14 DIAGNOSIS — R222 Localized swelling, mass and lump, trunk: Secondary | ICD-10-CM

## 2019-12-14 DIAGNOSIS — Z85038 Personal history of other malignant neoplasm of large intestine: Secondary | ICD-10-CM | POA: Diagnosis present

## 2019-12-14 DIAGNOSIS — D751 Secondary polycythemia: Secondary | ICD-10-CM | POA: Insufficient documentation

## 2019-12-14 LAB — COMPREHENSIVE METABOLIC PANEL
ALT: 12 U/L (ref 0–44)
AST: 24 U/L (ref 15–41)
Albumin: 4.2 g/dL (ref 3.5–5.0)
Alkaline Phosphatase: 60 U/L (ref 38–126)
Anion gap: 9 (ref 5–15)
BUN: 11 mg/dL (ref 8–23)
CO2: 25 mmol/L (ref 22–32)
Calcium: 9.3 mg/dL (ref 8.9–10.3)
Chloride: 103 mmol/L (ref 98–111)
Creatinine, Ser: 1.19 mg/dL (ref 0.61–1.24)
GFR calc Af Amer: >60 mL/min (ref >60)
GFR calc non Af Amer: >60 mL/min (ref >60)
Glucose, Bld: 124 mg/dL — ABNORMAL HIGH (ref 70–99)
Potassium: 3.8 mmol/L (ref 3.5–5.1)
Sodium: 137 mmol/L (ref 135–145)
Total Bilirubin: 0.4 mg/dL (ref 0.3–1.2)
Total Protein: 7.8 g/dL (ref 6.5–8.1)

## 2019-12-14 LAB — CBC WITH DIFFERENTIAL/PLATELET
Abs Immature Granulocytes: 0.03 10*3/uL (ref 0.00–0.07)
Basophils Absolute: 0.1 10*3/uL (ref 0.0–0.1)
Basophils Relative: 1 %
Eosinophils Absolute: 0.1 10*3/uL (ref 0.0–0.5)
Eosinophils Relative: 1 %
HCT: 49 % (ref 39.0–52.0)
Hemoglobin: 15.5 g/dL (ref 13.0–17.0)
Immature Granulocytes: 0 %
Lymphocytes Relative: 30 %
Lymphs Abs: 2.4 10*3/uL (ref 0.7–4.0)
MCH: 29.9 pg (ref 26.0–34.0)
MCHC: 31.6 g/dL (ref 30.0–36.0)
MCV: 94.4 fL (ref 80.0–100.0)
Monocytes Absolute: 0.7 10*3/uL (ref 0.1–1.0)
Monocytes Relative: 9 %
Neutro Abs: 4.8 10*3/uL (ref 1.7–7.7)
Neutrophils Relative %: 59 %
Platelets: 121 10*3/uL — ABNORMAL LOW (ref 150–400)
RBC: 5.19 MIL/uL (ref 4.22–5.81)
RDW: 15 % (ref 11.5–15.5)
WBC: 8 10*3/uL (ref 4.0–10.5)
nRBC: 0 % (ref 0.0–0.2)

## 2019-12-14 NOTE — Telephone Encounter (Signed)
Faxed orders to scheduling for bx / aspiration. fax conformation received.

## 2019-12-14 NOTE — Telephone Encounter (Signed)
Marcie Bal from scheduling called and reports the will need a COVID test before the biopsy. I have called to get the patient on the schedule i didnt get a  answer i was able to leave a message. The patient has been informed that he is schedule for 12/19/2019. The patient is schedule at 10:00AM for a 11:00 Am appointment.

## 2019-12-15 ENCOUNTER — Other Ambulatory Visit
Admission: RE | Admit: 2019-12-15 | Discharge: 2019-12-15 | Disposition: A | Discharge status: Home / Self Care | Payer: Medicare Other | Source: Ambulatory Visit | Attending: Hematology and Oncology | Admitting: Hematology and Oncology

## 2019-12-15 ENCOUNTER — Other Ambulatory Visit: Payer: Self-pay | Admitting: Radiology

## 2019-12-15 DIAGNOSIS — Z20822 Contact with and (suspected) exposure to covid-19: Secondary | ICD-10-CM | POA: Insufficient documentation

## 2019-12-15 DIAGNOSIS — Z01812 Encounter for preprocedural laboratory examination: Secondary | ICD-10-CM | POA: Insufficient documentation

## 2019-12-15 LAB — CEA: CEA: 9.3 ng/mL — ABNORMAL HIGH (ref 0.0–4.7)

## 2019-12-16 ENCOUNTER — Other Ambulatory Visit: Payer: Self-pay | Admitting: Radiology

## 2019-12-16 LAB — SARS CORONAVIRUS 2 (TAT 6-24 HRS): SARS Coronavirus 2: NEGATIVE

## 2019-12-19 ENCOUNTER — Ambulatory Visit: Admission: RE | Admit: 2019-12-19 | Payer: Medicare Other | Source: Ambulatory Visit

## 2019-12-19 ENCOUNTER — Telehealth: Payer: Self-pay | Admitting: *Deleted

## 2019-12-19 NOTE — Telephone Encounter (Signed)
[place] [facility] called to report that patient did NOT show up for his biopsy today.

## 2019-12-20 ENCOUNTER — Telehealth: Payer: Self-pay

## 2019-12-20 NOTE — Telephone Encounter (Signed)
Unable to leave a message / No answer. I called to check on the patient to see why he didn't go to his bx appointment.

## 2019-12-20 NOTE — Telephone Encounter (Signed)
  Please call patient about missed appointment and reschedule.  M

## 2019-12-23 ENCOUNTER — Telehealth: Payer: Self-pay | Admitting: Hematology and Oncology

## 2019-12-23 NOTE — Telephone Encounter (Signed)
12/23/2019 Multiple attempts were made to contact pt about missed chest wall biopsy on 12/19/19. Pt did complete pre-op COVID test on 12/15/19 but failed to arrive for the procedure. Appt was r/s for 12/29/19 @ 9 am. Reached out to cousin William Ford listed on his DPR and informed her of appt day and time, to arrive by 8 am at [facility] mall, and to be NPO after midnight on 12/28/19. Mrs. William Ford stated she would reach out to the patient and relay this information. SRW

## 2019-12-25 DIAGNOSIS — R222 Localized swelling, mass and lump, trunk: Secondary | ICD-10-CM | POA: Insufficient documentation

## 2019-12-27 ENCOUNTER — Telehealth: Payer: Self-pay

## 2019-12-27 ENCOUNTER — Other Ambulatory Visit: Admission: RE | Admit: 2019-12-27 | Payer: Medicare Other | Source: Ambulatory Visit

## 2019-12-27 NOTE — Telephone Encounter (Signed)
I tried to reach the patient to inform him that he need to go get COVID testing before his bx on Thursday. I was not able to reach him. I will keep trying.

## 2019-12-27 NOTE — Telephone Encounter (Signed)
Spoke with the patient to inform him that he will need to get COVID test before lung bx. I have informed the patient to be there at 8:00 AM. The patient was understanding and agreeable.

## 2019-12-28 ENCOUNTER — Other Ambulatory Visit: Payer: Self-pay | Admitting: Radiology

## 2019-12-28 ENCOUNTER — Other Ambulatory Visit
Admission: RE | Admit: 2019-12-28 | Discharge: 2019-12-28 | Disposition: A | Discharge status: Home / Self Care | Payer: Medicare Other | Source: Ambulatory Visit | Attending: Hematology and Oncology | Admitting: Hematology and Oncology

## 2019-12-28 ENCOUNTER — Other Ambulatory Visit: Payer: Self-pay

## 2019-12-28 DIAGNOSIS — Z20822 Contact with and (suspected) exposure to covid-19: Secondary | ICD-10-CM | POA: Insufficient documentation

## 2019-12-28 LAB — SARS CORONAVIRUS 2 (TAT 6-24 HRS): SARS Coronavirus 2: NEGATIVE

## 2019-12-29 ENCOUNTER — Other Ambulatory Visit: Payer: Self-pay

## 2019-12-29 ENCOUNTER — Ambulatory Visit
Admission: RE | Admit: 2019-12-29 | Discharge: 2019-12-29 | Disposition: A | Discharge status: Home / Self Care | Payer: Medicare Other | Source: Ambulatory Visit | Attending: Hematology and Oncology | Admitting: Hematology and Oncology

## 2019-12-29 DIAGNOSIS — Z85038 Personal history of other malignant neoplasm of large intestine: Secondary | ICD-10-CM | POA: Insufficient documentation

## 2019-12-29 DIAGNOSIS — R222 Localized swelling, mass and lump, trunk: Secondary | ICD-10-CM | POA: Insufficient documentation

## 2019-12-29 DIAGNOSIS — C182 Malignant neoplasm of ascending colon: Secondary | ICD-10-CM

## 2019-12-29 NOTE — Procedures (Signed)
Interventional Radiology Procedure:   Indications: Superficial lesion of left anterior chest wall  Procedure: US guided core biopsy and aspiration  Findings: Heterogenous lesion.  Aspirated a small amount of necrotic / purulent material.   Multiple core biopsies obtained.  Complications: None     EBL: minimal, less than 5 ml  Plan: Sent material to pathology and for culture.   Jette Lewan R. Anselm Pancoast, MD  Pager: [phone]

## 2019-12-29 NOTE — Discharge Instructions (Signed)

## 2019-12-30 LAB — SURGICAL PATHOLOGY

## 2020-01-03 LAB — AEROBIC/ANAEROBIC CULTURE W GRAM STAIN (SURGICAL/DEEP WOUND): Culture: NO GROWTH

## 2020-02-08 ENCOUNTER — Other Ambulatory Visit: Payer: Self-pay

## 2020-02-08 ENCOUNTER — Inpatient Hospital Stay: Payer: Medicare Other

## 2020-02-08 ENCOUNTER — Inpatient Hospital Stay: Payer: Medicare Other | Attending: Hematology and Oncology

## 2020-02-08 DIAGNOSIS — Z85038 Personal history of other malignant neoplasm of large intestine: Secondary | ICD-10-CM | POA: Diagnosis present

## 2020-02-08 DIAGNOSIS — F17210 Nicotine dependence, cigarettes, uncomplicated: Secondary | ICD-10-CM | POA: Insufficient documentation

## 2020-02-08 DIAGNOSIS — D751 Secondary polycythemia: Secondary | ICD-10-CM | POA: Diagnosis not present

## 2020-02-08 DIAGNOSIS — L723 Sebaceous cyst: Secondary | ICD-10-CM | POA: Insufficient documentation

## 2020-02-08 DIAGNOSIS — C182 Malignant neoplasm of ascending colon: Secondary | ICD-10-CM

## 2020-02-08 LAB — HEMOGLOBIN AND HEMATOCRIT, BLOOD
HCT: 46.3 % (ref 39.0–52.0)
Hemoglobin: 15 g/dL (ref 13.0–17.0)

## 2020-02-08 NOTE — Progress Notes (Signed)
hct less than 50, no phlebotomy today

## 2020-02-13 NOTE — Progress Notes (Signed)
[facility]  [address], [address] [place], [place] [address] Phone: [phone]  Fax: [phone]   Clinic Day:  02/14/2020  Referring physician: Jodi Marble, MD  Chief Complaint: Emric Kowalewski is a 69 y.o. 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   HPI: The patient was last seen in the medical oncology clinic on 12/14/2019. At that time, he felt "about the same".  He smoked a half a pack of cigarettes a day.  The lesion on his chest left wall was stable. Hematocrit was 49.0, hemoglobin 15.5, platelets 121,000, WBC 8,000. CMP was normal. CEA was 9.3.  Ultrasound guided needle biopsy of left anterior chest mass on 12/29/2019 revealed a benign sebaceous cyst.  Labs on 02/08/2020 revealed a hematocrit of 46.3 and a hemoglobin of 15.0.  He did not require phlebotomy.  During the interim, he has been "good".  He is smoking a little less than half a pack of cigarettes per day. He feels like he is gaining weight and his jeans are fitting a little bit tighter than usual. Other than that, everything is the same. 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   Past Medical History:  Diagnosis Date  . Arthritis    hands  . Colon cancer ([place])   . Emphysema of lung (H[facility])    mild (per pt)  . Hypertension   . Numbness    toes and fingers (S/P chemo - per pt)    Past Surgical History:  Procedure Laterality Date  . AMPUTATION Right 2003   4th   . CATARACT EXTRACTION W/PHACO Right 02/02/2017   Procedure: CATARACT EXTRACTION PHACO AND INTRAOCULAR LENS PLACEMENT ([place]) RIGHT;  Surgeon: Leandrew Koyanagi, MD;  Location: [place];  Service: Ophthalmology;  Laterality: Right;  . CATARACT EXTRACTION W/PHACO Left 02/25/2017   Procedure: CATARACT EXTRACTION PHACO AND INTRAOCULAR LENS PLACEMENT ([place]) left;  Surgeon: Leandrew Koyanagi, MD;  Location: [place];   Service: Ophthalmology;  Laterality: Left;  . COLON SURGERY  2013  . COLONOSCOPY    . COLONOSCOPY WITH PROPOFOL N/A 07/16/2018   Procedure: COLONOSCOPY WITH PROPOFOL;  Surgeon: Lollie Sails, MD;  Location: [facility] ENDOSCOPY;  Service: Endoscopy;  Laterality: N/A;  . ESOPHAGOGASTRODUODENOSCOPY (EGD) WITH PROPOFOL N/A 07/16/2018   Procedure: ESOPHAGOGASTRODUODENOSCOPY (EGD) WITH PROPOFOL;  Surgeon: Lollie Sails, MD;  Location: [facility] ENDOSCOPY;  Service: Endoscopy;  Laterality: N/A;    Family History  Problem Relation Age of Onset  . Cancer Maternal Grandfather     Social History:  reports that he has been smoking cigarettes. He has a 22.50 pack-year smoking history. He has never used smokeless tobacco. He reports current alcohol use of about 2.0 standard drinks of alcohol per week. He reports that he does not use drugs. He started smoking at age 53. He has stopped smoking several times. He was smoking 1pack/week. He is now smoking less than half a pack a day.He is trying to cut back. He lives in [place]. The patient has a "little sweet thing" (girlfriend since December). The patient is alone today.  Allergies: No Known Allergies  Current Medications: Current Outpatient Medications  Medication Sig Dispense Refill  . CELEBREX 200 MG capsule Take 200 mg by mouth daily.   0  . cetirizine (ZYRTEC) 10 MG tablet Take 1 tablet by mouth daily.    . cyclobenzaprine (FLEXERIL) 5 MG tablet Take 1 tablet (5 mg total)  by mouth 3 (three) times daily as needed for muscle spasms. 30 tablet 0  . fluticasone (FLONASE) 50 MCG/ACT nasal spray Place 1 spray into both nostrils daily.    Marland Kitchen gabapentin (NEURONTIN) 300 MG capsule Take 1 capsule by mouth daily.   0  . L-Methylfolate-Algae-B12-B6 (FOLTANX RF) 3-90.314-2-35 MG CAPS Take 1 tablet by mouth daily.   0  . lisinopril (PRINIVIL,ZESTRIL) 20 MG tablet Take 20 mg by mouth daily.  0  . LYRICA 75 MG capsule take 1 capsule by mouth twice a day 60  capsule 3  . Na Sulfate-K Sulfate-Mg Sulf 17.5-3.13-1.6 GM/177ML SOLN Take by mouth daily.    Marland Kitchen omeprazole (PRILOSEC) 20 MG capsule Take 20 mg by mouth daily.    . sildenafil (REVATIO) 20 MG tablet Take 2 tablets by mouth as needed.   0  . sildenafil (VIAGRA) 100 MG tablet SMARTSIG:0.5 Tablet(s) By Mouth     No current facility-administered medications for this visit.   Facility-Administered Medications Ordered in Other Visits  Medication Dose Route Frequency Provider Last Rate Last Admin  . heparin lock flush 100 unit/mL  500 Units Intravenous Once ,  C, MD      . sodium chloride 0.9 % injection 10 mL  10 mL Intravenous PRN Nolon Stalls C, MD   10 mL at 12/13/14 1043  . sodium chloride flush (NS) 0.9 % injection 10 mL  10 mL Intravenous PRN Nolon Stalls C, MD   10 mL at 08/20/16 1050  . sodium chloride flush (NS) 0.9 % injection 10 mL  10 mL Intravenous PRN Nolon Stalls C, MD   10 mL at 02/03/18 1020  . sodium chloride flush (NS) 0.9 % injection 10 mL  10 mL Intravenous PRN Lequita Asal, MD        Review of Systems  Constitutional: Negative for chills, diaphoresis, fever, malaise/fatigue and weight loss (up 2 lbs).       Feels "good".  HENT: Negative.  Negative for congestion, ear discharge, ear pain, hearing loss, nosebleeds, sinus pain, sore throat and tinnitus.   Eyes: Negative.  Negative for blurred vision, double vision, photophobia and pain.  Respiratory: Negative for cough, hemoptysis, sputum production and shortness of breath.        Smoking a little less than 1/2 pack per day.  Cardiovascular: Negative.  Negative for chest pain, palpitations, orthopnea, leg swelling and PND.  Gastrointestinal: Negative.  Negative for abdominal pain, blood in stool, constipation, diarrhea, heartburn, melena, nausea and vomiting.  Genitourinary: Negative.  Negative for dysuria, frequency, hematuria and urgency.  Musculoskeletal: Negative.  Negative for back  pain, falls, joint pain, myalgias and neck pain.  Skin: Negative.  Negative for itching and rash.  Neurological: Positive for sensory change (neuropathy in hands, worse at night). Negative for dizziness, tingling, speech change, weakness and headaches.  Endo/Heme/Allergies: Negative.  Does not bruise/bleed easily.  Psychiatric/Behavioral: Negative.  Negative for depression and memory loss. The patient is not nervous/anxious and does not have insomnia.   All other systems reviewed and are negative.  Performance status (ECOG): 0  Vitals Blood pressure (!) 142/83, pulse 88, temperature 97.9 F (36.6 C), temperature source Tympanic, resp. rate 18, height 6' 1" (1.854 m), weight 138 lb 5.4 oz (62.8 kg), SpO2 99 %.   Physical Exam Vitals and nursing note reviewed.  Constitutional:      General: He is not in acute distress.    Appearance: He is well-developed. He is not diaphoretic.  HENT:  Head: Normocephalic and atraumatic.     Mouth/Throat:     Mouth: Mucous membranes are moist.     Pharynx: Oropharynx is clear. No oropharyngeal exudate.  Eyes:     General: No scleral icterus.    Extraocular Movements: Extraocular movements intact.     Conjunctiva/sclera: Conjunctivae normal.     Pupils: Pupils are equal, round, and reactive to light.     Comments: Glasses. Brown eyes.  Cardiovascular:     Rate and Rhythm: Normal rate and regular rhythm.     Heart sounds: Normal heart sounds. No murmur heard.   Pulmonary:     Effort: Pulmonary effort is normal. No respiratory distress.     Breath sounds: Normal breath sounds. No wheezing or rales.  Abdominal:     General: Bowel sounds are normal. There is no distension.     Palpations: Abdomen is soft. There is no hepatomegaly, splenomegaly or mass.     Tenderness: There is no abdominal tenderness. There is no guarding or rebound.  Musculoskeletal:        General: No tenderness. Normal range of motion.     Cervical back: Normal range of  motion and neck supple.  Lymphadenopathy:     Head:     Right side of head: No preauricular, posterior auricular or occipital adenopathy.     Left side of head: No preauricular, posterior auricular or occipital adenopathy.     Cervical: No cervical adenopathy.     Upper Body:     Right upper body: No supraclavicular or axillary adenopathy.     Left upper body: No supraclavicular or axillary adenopathy.     Lower Body: No right inguinal adenopathy. No left inguinal adenopathy.  Skin:    General: Skin is warm and dry.     Coloration: Skin is not pale.     Findings: No erythema or rash.  Neurological:     Mental Status: He is alert and oriented to person, place, and time.     Deep Tendon Reflexes: Reflexes are normal and symmetric.  Psychiatric:        Behavior: Behavior normal.        Thought Content: Thought content normal.        Judgment: Judgment normal.      12/14/2019    Imaging studies: 07/03/2014:  Chest, abdomen, and pelvis CT revealed no evidence of metastatic disease. There was a new irregular shaped pleural based pulmonary nodule in the periphery of the RUL most likely infectious/inflammatory. 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  10/20/2014:  Chest CTrevealed a stable small pleural-based area of nodularity in the right upper lobe (favor benign area of scarring). There was mild diffuse bronchial wall thickening with moderate centrilobular and mild paraseptal emphysema; imaging findings suggestive of underlying COPD.  07/19/2015:  Chest CTrevealed no acute cardiopulmonary abnormalities. He has a peripheral scar like density within the right upper lobe.  07/03/2016:  Chest, abdomen, and pelvis CTon 07/03/2016 revealed slight enlargement of the right upper lobe subpleural pulmonary nodule (3 mm to 4.6 mm). There was persistent irregularity of the gallbladder wall. There was a stable 12 mm right adrenal nodule. There was mild enlargement of the prostate gland.  There was calcific atherosclerotic disease of the aorta with persistentnear complete occlusion of the proximal superficial femoral arteriesbilaterally.  01/14/2017:  Chest CT revealed an interval decrease in medial right upper lobe pulmonary nodule from 5 mm on prior study to 2 mm on the current exam. There was a  1-2 mm right lower lobe pulmonary nodule is stable since 10/20/2014 consistent with benign process.  07/03/2017:  Chest, abdomen, and pelvis CTrevealed no evidence of recurrent or metastatic carcinoma within the abdomen or pelvis. There was a stable 1.2 cm right adrenal nodule noted. Stable 2 mm right upper and lower lobe pulmonary nodules. Several new ground glass nodules in both upper lobes, with the largest measuring 9 mm, and felt to be likely of inflammatory or infectious etiology. Follow-up chest CT recommended in 6 months. 01/11/2018:  Chest CTrevealed the previously seen bilateral pulmonary nodular densities had resolved. 07/06/2018:  AbdomenandpelvisCTrevealed no findings of recurrent or metastatic disease.   No visits with results within 3 Day(s) from this visit.  Latest known visit with results is:  Appointment on 02/08/2020  Component Date Value Ref Range Status  . Hemoglobin 02/08/2020 15.0  13.0 - 17.0 g/dL Final  . HCT 02/08/2020 46.3  39 - 52 % Final   Performed at [facility], [address] [place], [place] [address]     Assessment:  Jordan Pardini is a 69 y.o. male with stage IIB colon cancer. He underwent right colectomy on 11/05/2012. Pathology revealed a T3N0M0 tumor in the cecum with 17 negative lymph nodes. Circumferential margin was positive. There was lymphovascular invasion. He had 1 deposit of metastases in peritoneum. Mismatch repair was positive.  He received 12 cycles of FOLFOXchemotherapy from 12/31/2012 - 08/19/2013. Chemotherapy was interrupted because of small bowel obstruction due to adhesions in 05/2013. 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  He has an elevated CEA with negative imaging studies. CEA was 10.7 on 12/31/2012, 13.4 on 06/17/2013, 11.7 on 07/18/2013, 8.3 on 09/28/2013, 8.0 on 12/30/2013, 9.6 on 05/17/2014, 9.8 on 10/25/2014, 10.3 on 05/09/2015, 10.4 on 07/25/2015, 8.4 on 08/08/2015, 8.8 on 11/07/2015, 8.3 on 02/06/2016, 18.8 on 05/28/2016, 8.8 on 08/20/2016, 8.9 on 01/07/2017, 8.7 on 04/29/2017, 8.5 on 06/24/2017, 7.5 on 09/02/2017, 10.0 on 01/06/2018, 8.6 on 02/03/2018, 8.2 on 04/28/2018, 9.7 on 03/31/2019, 9.7 on 04/28/2019, 8.6 on 08/18/2019, 10.5 on 10/17/2019, and 9.3 on 12/14/2019.   Colonoscopy on 07/16/2018 revealed 5 polyps range from 2-41m. Pathology showed tubular adenoma and hyperplastic polyps.EGDon 07/16/2018 revealed esophageal plaquesc/w Candidiasis,LA Grade B erosive esophagitis, atrophic gastritis,andduodenitis.  Chest, abdomen, and pelvis CTon 07/03/2017 revealed no evidence of recurrent or metastatic carcinoma within the abdomen or pelvis. There was a stable 1.2 cm right adrenal nodule. There were stable 2 mm right upper and lower lobe pulmonary nodules. Several new ground glass nodules in both upper lobes, with the largest measuring 9 mm, and felt to be likely of inflammatory or infectious etiology.  Chest CTon 01/11/2018 revealed the previously seen bilateral pulmonary nodular densities had resolved.  AbdomenandpelvisCTon04/09/2018 revealed no findings of recurrent or metastatic disease.Chest, abdomen, and pelvis CT on 10/31/2019 revealed no evidence of recurrent or metastatic disease. The LEFT chest area of water density with ovoid characteristics had enlarged slightly since 2019 measuring 4.4 cm as compared to 4.1 cm. This was slowly enlarging over time. This was well-circumscribed and remained of uncertain significance.  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 Work-up on 07/25/2015 revealed a hematocrit of 57.2, hemoglobin  19.2, MCV 95.1, platelets 169,000, WBC 8300 with an ANC of 4900. Erythropoietin level was 6.0 (2.6-18.5). Testosterone level was 680. JAK2 was negative for V617F and exon 12. Ferritin was 90, iron saturation 24% , and TIBC 372.  He undergoes small volume phlebotomies(250 cc) to maintain a hematocrit <52 and  a hemoglobin <18. With an elevated hematocrit, he feels sluggish/drowsy. He feels better 2 days after his phlebotomies. He underwent phlebotomy (500 cc) on 07/25/2015 with subsequent fatigue. Last phlebotomy was on 04/18/2019.  Ultrasound guided needle biopsy of left anterior chest mass on 12/29/2019 revealed a benign sebaceous cyst.  Patient was vaccinated for COVID-19 around 06/2019.  Symptomatically, he feels "good".  He is smoking < 1/2 pack of cigarettes/day. He feels like he is gaining weight; his jeans are fitting a little bit tighter than usual.    Plan: 1.   Review interval labs. 2.   Stage IIB colon cancer Clinically, he is doing well. CEA is 9.3 on 12/14/2019. CEA fluctuates: 8.2 - 18.8 since completion of chemotherapy. Chest, abdomen and pelvis CT on 10/31/2019 revealed no evidence of recurrent disease. Continue to monitor. 3.   Secondary polycythemia Hematocrit49.0. Hemoglobin  15.5 on 12/14/2019. Hematocrit goal < 52. He is smoking < 1/2 pack a day. Encourage smoking cessation. 4.   Chest wall mass  Needle biopsy of left anterior chest mass on 12/29/2019 revealed a benign sebaceous cyst.  No intervention needed. 5.   RTC in 3 months for labs (CBC with diff, CMP, CEA) and +/- phlebotomy. 6.   RTC in 6 months for MD assessment, labs (CBC with diff, CMP, CEA), and +/- phlebotomy.  I discussed the assessment and treatment plan with the patient.  The patient was provided an opportunity to ask questions and all were answered.  The patient agreed with the plan and demonstrated an understanding of the instructions.  The patient was advised to call back if the  symptoms worsen or if the condition fails to improve as anticipated.   Lequita Asal, MD, PhD    02/14/2020, 10:42 AM  I, Mirian Mo Tufford, am acting as Education administrator for [employer]. Mike Gip, MD, PhD.  I,  C. Mike Gip, MD, have reviewed the above documentation for accuracy and completeness, and I agree with the above.

## 2020-02-14 ENCOUNTER — Other Ambulatory Visit: Payer: Self-pay

## 2020-02-14 ENCOUNTER — Encounter: Payer: Self-pay | Admitting: Hematology and Oncology

## 2020-02-14 ENCOUNTER — Inpatient Hospital Stay (HOSPITAL_BASED_OUTPATIENT_CLINIC_OR_DEPARTMENT_OTHER): Payer: Medicare Other | Admitting: Hematology and Oncology

## 2020-02-14 VITALS — BP 142/83 | HR 88 | Temp 97.9°F | Resp 18 | Ht 73.0 in | Wt 138.3 lb

## 2020-02-14 DIAGNOSIS — C182 Malignant neoplasm of ascending colon: Secondary | ICD-10-CM

## 2020-02-14 DIAGNOSIS — Z85038 Personal history of other malignant neoplasm of large intestine: Secondary | ICD-10-CM | POA: Diagnosis not present

## 2020-02-14 DIAGNOSIS — D751 Secondary polycythemia: Secondary | ICD-10-CM | POA: Diagnosis not present

## 2020-02-14 DIAGNOSIS — R222 Localized swelling, mass and lump, trunk: Secondary | ICD-10-CM

## 2020-02-14 NOTE — Progress Notes (Signed)
No new changes noted today 

## 2020-04-04 ENCOUNTER — Other Ambulatory Visit: Payer: Medicare Other

## 2020-04-04 ENCOUNTER — Ambulatory Visit: Payer: Medicare Other | Admitting: Hematology and Oncology

## 2020-05-15 ENCOUNTER — Inpatient Hospital Stay: Payer: Medicare Other | Attending: Hematology and Oncology

## 2020-05-15 ENCOUNTER — Other Ambulatory Visit: Payer: Self-pay

## 2020-05-15 ENCOUNTER — Telehealth: Payer: Self-pay

## 2020-05-15 ENCOUNTER — Inpatient Hospital Stay: Payer: Medicare Other

## 2020-05-15 VITALS — BP 166/91 | HR 88 | Temp 96.5°F | Resp 16

## 2020-05-15 DIAGNOSIS — C182 Malignant neoplasm of ascending colon: Secondary | ICD-10-CM | POA: Diagnosis present

## 2020-05-15 DIAGNOSIS — D751 Secondary polycythemia: Secondary | ICD-10-CM

## 2020-05-15 LAB — COMPREHENSIVE METABOLIC PANEL
ALT: 15 U/L (ref 0–44)
AST: 29 U/L (ref 15–41)
Albumin: 4.5 g/dL (ref 3.5–5.0)
Alkaline Phosphatase: 72 U/L (ref 38–126)
Anion gap: 12 (ref 5–15)
BUN: 14 mg/dL (ref 8–23)
CO2: 25 mmol/L (ref 22–32)
Calcium: 9.9 mg/dL (ref 8.9–10.3)
Chloride: 98 mmol/L (ref 98–111)
Creatinine, Ser: 1.28 mg/dL — ABNORMAL HIGH (ref 0.61–1.24)
GFR, Estimated: >60 mL/min (ref >60)
Glucose, Bld: 102 mg/dL — ABNORMAL HIGH (ref 70–99)
Potassium: 5.5 mmol/L — ABNORMAL HIGH (ref 3.5–5.1)
Sodium: 135 mmol/L (ref 135–145)
Total Bilirubin: 0.9 mg/dL (ref 0.3–1.2)
Total Protein: 8.8 g/dL — ABNORMAL HIGH (ref 6.5–8.1)

## 2020-05-15 LAB — CBC WITH DIFFERENTIAL/PLATELET
Abs Immature Granulocytes: 0.02 10*3/uL (ref 0.00–0.07)
Basophils Absolute: 0.1 10*3/uL (ref 0.0–0.1)
Basophils Relative: 1 %
Eosinophils Absolute: 0 10*3/uL (ref 0.0–0.5)
Eosinophils Relative: 1 %
HCT: 50.8 % (ref 39.0–52.0)
Hemoglobin: 16.2 g/dL (ref 13.0–17.0)
Immature Granulocytes: 0 %
Lymphocytes Relative: 33 %
Lymphs Abs: 2.6 10*3/uL (ref 0.7–4.0)
MCH: 29.6 pg (ref 26.0–34.0)
MCHC: 31.9 g/dL (ref 30.0–36.0)
MCV: 92.9 fL (ref 80.0–100.0)
Monocytes Absolute: 0.6 10*3/uL (ref 0.1–1.0)
Monocytes Relative: 8 %
Neutro Abs: 4.6 10*3/uL (ref 1.7–7.7)
Neutrophils Relative %: 57 %
Platelets: 152 10*3/uL (ref 150–400)
RBC: 5.47 MIL/uL (ref 4.22–5.81)
RDW: 14.7 % (ref 11.5–15.5)
WBC: 8 10*3/uL (ref 4.0–10.5)
nRBC: 0 % (ref 0.0–0.2)

## 2020-05-15 NOTE — Telephone Encounter (Signed)
I spoke with patient in regards to potassium results being high and per dr.corcroan she would like for patient to stop taking potassium. Patient agreed.

## 2020-05-15 NOTE — Progress Notes (Signed)
Patient received prescribed treatment in clinic. Tolerated well. Patient stable at discharge. 

## 2020-05-16 ENCOUNTER — Other Ambulatory Visit: Payer: Self-pay

## 2020-05-16 ENCOUNTER — Telehealth: Payer: Self-pay

## 2020-05-16 DIAGNOSIS — C182 Malignant neoplasm of ascending colon: Secondary | ICD-10-CM

## 2020-05-16 LAB — CEA: CEA: 7.9 ng/mL — ABNORMAL HIGH (ref 0.0–4.7)

## 2020-05-16 NOTE — Addendum Note (Signed)
Addended by: Drue Dun on: 05/16/2020 01:55 PM   Modules accepted: Orders

## 2020-05-16 NOTE — Telephone Encounter (Signed)
Patient coming in at 10:00 tomorrow to have potassium rechecked and yes his PCP was notified

## 2020-05-16 NOTE — Telephone Encounter (Signed)
-----   Message from Lequita Asal, MD sent at 05/16/2020  9:11 AM EST ----- Regarding: Potassium 5.5 yesterday  Redraw potassium.  Was his PCP notified as well as the patient?  M  ----- Message ----- From: Buel Ream, Lab [facility] [place] Sent: 05/15/2020   2:06 PM EST To: Lequita Asal, MD

## 2020-05-16 NOTE — Telephone Encounter (Signed)
Called patient regarding is high potassium. No answer and unable to leave VM. Called brothers number in the chart and asked that he tell his brother to call us

## 2020-05-17 ENCOUNTER — Inpatient Hospital Stay: Payer: Medicare Other

## 2020-05-17 ENCOUNTER — Other Ambulatory Visit: Payer: Self-pay

## 2020-05-17 DIAGNOSIS — D751 Secondary polycythemia: Secondary | ICD-10-CM

## 2020-05-17 DIAGNOSIS — C182 Malignant neoplasm of ascending colon: Secondary | ICD-10-CM

## 2020-05-17 LAB — CBC WITH DIFFERENTIAL/PLATELET
Abs Immature Granulocytes: 0.01 10*3/uL (ref 0.00–0.07)
Basophils Absolute: 0 10*3/uL (ref 0.0–0.1)
Basophils Relative: 1 %
Eosinophils Absolute: 0.1 10*3/uL (ref 0.0–0.5)
Eosinophils Relative: 1 %
HCT: 46.3 % (ref 39.0–52.0)
Hemoglobin: 14.8 g/dL (ref 13.0–17.0)
Immature Granulocytes: 0 %
Lymphocytes Relative: 33 %
Lymphs Abs: 1.9 10*3/uL (ref 0.7–4.0)
MCH: 29.8 pg (ref 26.0–34.0)
MCHC: 32 g/dL (ref 30.0–36.0)
MCV: 93.2 fL (ref 80.0–100.0)
Monocytes Absolute: 0.5 10*3/uL (ref 0.1–1.0)
Monocytes Relative: 8 %
Neutro Abs: 3.4 10*3/uL (ref 1.7–7.7)
Neutrophils Relative %: 57 %
Platelets: 161 10*3/uL (ref 150–400)
RBC: 4.97 MIL/uL (ref 4.22–5.81)
RDW: 14.7 % (ref 11.5–15.5)
WBC: 6 10*3/uL (ref 4.0–10.5)
nRBC: 0 % (ref 0.0–0.2)

## 2020-05-17 LAB — COMPREHENSIVE METABOLIC PANEL
ALT: 14 U/L (ref 0–44)
AST: 26 U/L (ref 15–41)
Albumin: 4.3 g/dL (ref 3.5–5.0)
Alkaline Phosphatase: 68 U/L (ref 38–126)
Anion gap: 8 (ref 5–15)
BUN: 15 mg/dL (ref 8–23)
CO2: 27 mmol/L (ref 22–32)
Calcium: 9.7 mg/dL (ref 8.9–10.3)
Chloride: 99 mmol/L (ref 98–111)
Creatinine, Ser: 1.35 mg/dL — ABNORMAL HIGH (ref 0.61–1.24)
GFR, Estimated: 57 mL/min — ABNORMAL LOW (ref >60)
Glucose, Bld: 123 mg/dL — ABNORMAL HIGH (ref 70–99)
Potassium: 4.6 mmol/L (ref 3.5–5.1)
Sodium: 134 mmol/L — ABNORMAL LOW (ref 135–145)
Total Bilirubin: 0.6 mg/dL (ref 0.3–1.2)
Total Protein: 8.1 g/dL (ref 6.5–8.1)

## 2020-05-18 LAB — CEA: CEA: 7.9 ng/mL — ABNORMAL HIGH (ref 0.0–4.7)

## 2020-08-14 DIAGNOSIS — Z961 Presence of intraocular lens: Secondary | ICD-10-CM | POA: Diagnosis not present

## 2020-09-05 DIAGNOSIS — D696 Thrombocytopenia, unspecified: Secondary | ICD-10-CM | POA: Diagnosis not present

## 2020-09-05 DIAGNOSIS — J301 Allergic rhinitis due to pollen: Secondary | ICD-10-CM | POA: Diagnosis not present

## 2020-09-05 DIAGNOSIS — D751 Secondary polycythemia: Secondary | ICD-10-CM | POA: Diagnosis not present

## 2020-09-05 DIAGNOSIS — G63 Polyneuropathy in diseases classified elsewhere: Secondary | ICD-10-CM | POA: Diagnosis not present

## 2020-10-03 ENCOUNTER — Encounter: Payer: Self-pay | Admitting: Oncology

## 2020-10-03 ENCOUNTER — Other Ambulatory Visit: Payer: Medicare Other

## 2020-10-03 ENCOUNTER — Other Ambulatory Visit: Payer: Self-pay

## 2020-10-03 ENCOUNTER — Inpatient Hospital Stay: Payer: Medicare Other

## 2020-10-03 ENCOUNTER — Inpatient Hospital Stay: Payer: Medicare Other | Attending: Oncology

## 2020-10-03 ENCOUNTER — Ambulatory Visit: Payer: Medicare Other | Admitting: Oncology

## 2020-10-03 ENCOUNTER — Inpatient Hospital Stay (HOSPITAL_BASED_OUTPATIENT_CLINIC_OR_DEPARTMENT_OTHER): Payer: Medicare Other | Admitting: Oncology

## 2020-10-03 VITALS — BP 124/92 | HR 86 | Temp 96.9°F | Wt 135.1 lb

## 2020-10-03 DIAGNOSIS — C182 Malignant neoplasm of ascending colon: Secondary | ICD-10-CM

## 2020-10-03 DIAGNOSIS — Z79899 Other long term (current) drug therapy: Secondary | ICD-10-CM | POA: Insufficient documentation

## 2020-10-03 DIAGNOSIS — Z08 Encounter for follow-up examination after completed treatment for malignant neoplasm: Secondary | ICD-10-CM

## 2020-10-03 DIAGNOSIS — F17210 Nicotine dependence, cigarettes, uncomplicated: Secondary | ICD-10-CM | POA: Insufficient documentation

## 2020-10-03 DIAGNOSIS — D751 Secondary polycythemia: Secondary | ICD-10-CM

## 2020-10-03 DIAGNOSIS — Z85038 Personal history of other malignant neoplasm of large intestine: Secondary | ICD-10-CM | POA: Diagnosis not present

## 2020-10-03 DIAGNOSIS — R970 Elevated carcinoembryonic antigen [CEA]: Secondary | ICD-10-CM | POA: Diagnosis not present

## 2020-10-03 LAB — CBC WITH DIFFERENTIAL/PLATELET
Abs Immature Granulocytes: 0.02 10*3/uL (ref 0.00–0.07)
Basophils Absolute: 0.1 10*3/uL (ref 0.0–0.1)
Basophils Relative: 1 %
Eosinophils Absolute: 0.1 10*3/uL (ref 0.0–0.5)
Eosinophils Relative: 1 %
HCT: 43 % (ref 39.0–52.0)
Hemoglobin: 13.8 g/dL (ref 13.0–17.0)
Immature Granulocytes: 0 %
Lymphocytes Relative: 34 %
Lymphs Abs: 2.2 10*3/uL (ref 0.7–4.0)
MCH: 27.9 pg (ref 26.0–34.0)
MCHC: 32.1 g/dL (ref 30.0–36.0)
MCV: 86.9 fL (ref 80.0–100.0)
Monocytes Absolute: 0.5 10*3/uL (ref 0.1–1.0)
Monocytes Relative: 8 %
Neutro Abs: 3.7 10*3/uL (ref 1.7–7.7)
Neutrophils Relative %: 56 %
Platelets: 138 10*3/uL — ABNORMAL LOW (ref 150–400)
RBC: 4.95 MIL/uL (ref 4.22–5.81)
RDW: 15.6 % — ABNORMAL HIGH (ref 11.5–15.5)
WBC: 6.7 10*3/uL (ref 4.0–10.5)
nRBC: 0 % (ref 0.0–0.2)

## 2020-10-03 LAB — COMPREHENSIVE METABOLIC PANEL
ALT: 12 U/L (ref 0–44)
AST: 21 U/L (ref 15–41)
Albumin: 4.2 g/dL (ref 3.5–5.0)
Alkaline Phosphatase: 74 U/L (ref 38–126)
Anion gap: 7 (ref 5–15)
BUN: 13 mg/dL (ref 8–23)
CO2: 26 mmol/L (ref 22–32)
Calcium: 9.2 mg/dL (ref 8.9–10.3)
Chloride: 100 mmol/L (ref 98–111)
Creatinine, Ser: 1.17 mg/dL (ref 0.61–1.24)
GFR, Estimated: >60 mL/min (ref >60)
Glucose, Bld: 119 mg/dL — ABNORMAL HIGH (ref 70–99)
Potassium: 4.2 mmol/L (ref 3.5–5.1)
Sodium: 133 mmol/L — ABNORMAL LOW (ref 135–145)
Total Bilirubin: 0.8 mg/dL (ref 0.3–1.2)
Total Protein: 7.9 g/dL (ref 6.5–8.1)

## 2020-10-04 ENCOUNTER — Encounter: Payer: Self-pay | Admitting: Nurse Practitioner

## 2020-10-04 LAB — CEA: CEA: 6.2 ng/mL — ABNORMAL HIGH (ref 0.0–4.7)

## 2020-10-04 NOTE — Progress Notes (Signed)
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  Pathology showed invasive adenocarcinoma T3 N0 M0 with 17 negative lymph nodes.  There was 1 tumor deposit present which would be considered as N1 disease by today staging.  He received 12 cycles of adjuvant FOLFOX chemotherapy between October 2014 to May 2015.  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  CEA levels even at the time of colon cancer diagnosis was close to 10 and has remained elevated around 7 probably due to smoking.  Last CT scan in August 2021 did not show any evidence of recurrence disease.  Also has a history of secondary polycythemia which has been attributed to his smoking.  JAK2 and exon 12 mutation testing was negative.  He has undergone phlebotomies in the past to keep hematocrit less than 52.  He was found to have a left anterior chest wall mass which was biopsied in September 2021 and was a benign sebaceous cyst              Interval history-patient is doing well.  He is cutting down on his smoking and hopes to quit soon as he cannot afford to buy cigarettes anymore.  Denies any bleeding in his stool or urine  ECOG PS- 1 Pain scale- 0  Review of systems- Review of Systems  Constitutional:  Negative for chills, fever, malaise/fatigue and weight loss.  HENT:  Negative for congestion, ear discharge and nosebleeds.   Eyes:  Negative for blurred vision.  Respiratory:  Negative for  cough, hemoptysis, sputum production, shortness of breath and wheezing.   Cardiovascular:  Negative for chest pain, palpitations, orthopnea and claudication.  Gastrointestinal:  Negative for abdominal pain, blood in stool, constipation, diarrhea, heartburn, melena, nausea and vomiting.  Genitourinary:  Negative for dysuria, flank pain, frequency, hematuria and urgency.  Musculoskeletal:  Negative for back pain, joint pain and myalgias.  Skin:  Negative for rash.  Neurological:  Negative for dizziness, tingling, focal weakness, seizures, weakness and headaches.  Endo/Heme/Allergies:  Does not bruise/bleed easily.  Psychiatric/Behavioral:  Negative for depression and suicidal ideas. The patient does not have insomnia.       No Known Allergies   Past Medical History:  Diagnosis Date   Arthritis    hands   Colon cancer ([place])    Emphysema of lung ([place])    mild (per pt)   Hypertension    Numbness    toes and fingers (S/P chemo - per pt)     Past Surgical History:  Procedure Laterality Date   AMPUTATION Right 2003   4th    CATARACT EXTRACTION W/PHACO Right 02/02/2017   Procedure: CATARACT EXTRACTION PHACO AND INTRAOCULAR LENS PLACEMENT ([place]) RIGHT;  Surgeon: Leandrew Koyanagi, MD;  Location: [place];  Service: Ophthalmology;  Laterality: Right;   CATARACT EXTRACTION W/PHACO Left 02/25/2017   Procedure: CATARACT EXTRACTION PHACO AND INTRAOCULAR LENS PLACEMENT ([place]) left;  Surgeon: Leandrew Koyanagi, MD;  Location: [place];  Service: Ophthalmology;  Laterality: Left;  COLON SURGERY  2013   COLONOSCOPY     COLONOSCOPY WITH PROPOFOL N/A 07/16/2018   Procedure: COLONOSCOPY WITH PROPOFOL;  Surgeon: Lollie Sails, MD;  Location: [facility] ENDOSCOPY;  Service: Endoscopy;  Laterality: N/A;   ESOPHAGOGASTRODUODENOSCOPY (EGD) WITH PROPOFOL N/A 07/16/2018   Procedure: ESOPHAGOGASTRODUODENOSCOPY (EGD) WITH PROPOFOL;  Surgeon: Lollie Sails, MD;  [facility]  ENDOSCOPY;  Service: Endoscopy;  Laterality: N/A;    Social History   Socioeconomic History   Marital status: Divorced    Spouse name: Not on file   Number of children: Not on file   Years of education: Not on file   Highest education level: Not on file  Occupational History   Not on file  Tobacco Use   Smoking status: Every Day    Packs/day: 0.50    Years: 45.00    Pack years: 22.50    Types: Cigarettes   Smokeless tobacco: Never   Tobacco comments:    since age 49  Vaping Use   Vaping Use: Never used  Substance and Sexual Activity   Alcohol use: Yes    Alcohol/week: 2.0 standard drinks    Types: 2 Cans of beer per week   Drug use: No   Sexual activity: Not on file  Other Topics Concern   Not on file  Social History Narrative   Not on file   Social Determinants of Health   Financial Resource Strain: Not on file  Food Insecurity: Not on file  Transportation Needs: Not on file  Physical Activity: Not on file  Stress: Not on file  Social Connections: Not on file  Intimate Partner Violence: Not on file    Family History  Problem Relation Age of Onset   Cancer Maternal Grandfather      Current Outpatient Medications:    CELEBREX 200 MG capsule, Take 200 mg by mouth daily. , Disp: , Rfl: 0   cetirizine (ZYRTEC) 10 MG tablet, Take 1 tablet by mouth daily., Disp: , Rfl:    cyclobenzaprine (FLEXERIL) 5 MG tablet, Take 1 tablet (5 mg total) by mouth 3 (three) times daily as needed for muscle spasms., Disp: 30 tablet, Rfl: 0   fluticasone (FLONASE) 50 MCG/ACT nasal spray, Place 1 spray into both nostrils daily., Disp: , Rfl:    gabapentin (NEURONTIN) 300 MG capsule, Take 1 capsule by mouth daily. , Disp: , Rfl: 0   L-Methylfolate-Algae-B12-B6 (FOLTANX RF) 3-90.314-2-35 MG CAPS, Take 1 tablet by mouth daily. , Disp: , Rfl: 0   lisinopril (PRINIVIL,ZESTRIL) 20 MG tablet, Take 20 mg by mouth daily., Disp: , Rfl: 0   LYRICA 75 MG capsule, take 1 capsule by mouth twice a  day, Disp: 60 capsule, Rfl: 3   Na Sulfate-K Sulfate-Mg Sulf 17.5-3.13-1.6 GM/177ML SOLN, Take by mouth daily., Disp: , Rfl:    sildenafil (REVATIO) 20 MG tablet, Take 2 tablets by mouth as needed. , Disp: , Rfl: 0   sildenafil (VIAGRA) 100 MG tablet, SMARTSIG:0.5 Tablet(s) By Mouth, Disp: , Rfl:    omeprazole (PRILOSEC) 20 MG capsule, Take 20 mg by mouth daily., Disp: , Rfl:    SPS 15 GM/60ML suspension, SMARTSIG:120 Milliliter(s) By Mouth Once (Patient not taking: Reported on 10/03/2020), Disp: , Rfl:  No current facility-administered medications for this visit.  Facility-Administered Medications Ordered in Other Visits:    heparin lock flush 100 unit/mL, 500 Units, Intravenous, Once, Corcoran, Melissa C, MD   sodium chloride 0.9 % injection 10 mL, 10 mL, Intravenous, PRN, Mike Gip, Melissa  C, MD, 10 mL at 12/13/14 1043   sodium chloride flush (NS) 0.9 % injection 10 mL, 10 mL, Intravenous, PRN, Mike Gip, Melissa C, MD, 10 mL at 08/20/16 1050   sodium chloride flush (NS) 0.9 % injection 10 mL, 10 mL, Intravenous, PRN, Mike Gip, Melissa C, MD, 10 mL at 02/03/18 1020   sodium chloride flush (NS) 0.9 % injection 10 mL, 10 mL, Intravenous, PRN, Lequita Asal, MD  Physical exam:  Vitals:   10/03/20 1411  BP: (!) 124/92  Pulse: 86  Temp: (!) 96.9 F (36.1 C)  SpO2: 98%  Weight: 135 lb 2.3 oz (61.3 kg)   Physical Exam Constitutional:      General: He is not in acute distress. Cardiovascular:     Rate and Rhythm: Normal rate and regular rhythm.     Heart sounds: Normal heart sounds.  Pulmonary:     Effort: Pulmonary effort is normal.     Breath sounds: Normal breath sounds.  Abdominal:     General: Bowel sounds are normal.     Palpations: Abdomen is soft.  Skin:    General: Skin is warm and dry.  Neurological:     Mental Status: He is alert and oriented to person, place, and time.     CMP Latest Ref Rng & Units 10/03/2020  Glucose 70 - 99 mg/dL 119(H)  BUN 8 - 23 mg/dL 13   Creatinine 0.61 - 1.24 mg/dL 1.17  Sodium 135 - 145 mmol/L 133(L)  Potassium 3.5 - 5.1 mmol/L 4.2  Chloride 98 - 111 mmol/L 100  CO2 22 - 32 mmol/L 26  Calcium 8.9 - 10.3 mg/dL 9.2  Total Protein 6.5 - 8.1 g/dL 7.9  Total Bilirubin 0.3 - 1.2 mg/dL 0.8  Alkaline Phos 38 - 126 U/L 74  AST 15 - 41 U/L 21  ALT 0 - 44 U/L 12   CBC Latest Ref Rng & Units 10/03/2020  WBC 4.0 - 10.5 K/uL 6.7  Hemoglobin 13.0 - 17.0 g/dL 13.8  Hematocrit 39.0 - 52.0 % 43.0  Platelets 150 - 400 K/uL 138(L)      Assessment and plan- Patient is a 70 y.o. male who is here for follow-up of following issues:  Stage II colon cancer: This was back in 2014 and he is s/p surgery and 12 cycles of adjuvant FOLFOX chemotherapy.  Has been more than 5 years since his colon cancer diagnosis and he does not require any surveillance imaging or tumor marker testing at this time.  He has mildly elevated baseline CEA since the time of his diagnosis possibly related to his smoking as well.  Last scans in 2021 did not show any evidence of malignancy.  Repeat CBC with differential, CMP, CEA in 6 months in 1 year and I will see him back in 1 year.  At that point  I would stop checking his CEAs as well. 2.  Secondary polycythemia:Patient has decided to quit smoking as he is unable to afford cigarettes anymore.  His hematocrit today is 43.  Goal for phlebotomy would only be hematocrit greater than 55.  He does not require phlebotomy today   Visit Diagnosis 1. Encounter for follow-up surveillance of colon cancer   2. Polycythemia, secondary      Dr. Randa Evens, MD, MPH [facility] at [facility] [phone] 10/04/2020 11:02 AM

## 2020-12-05 DIAGNOSIS — D696 Thrombocytopenia, unspecified: Secondary | ICD-10-CM | POA: Diagnosis not present

## 2020-12-07 DIAGNOSIS — G63 Polyneuropathy in diseases classified elsewhere: Secondary | ICD-10-CM | POA: Diagnosis not present

## 2020-12-07 DIAGNOSIS — M1990 Unspecified osteoarthritis, unspecified site: Secondary | ICD-10-CM | POA: Diagnosis not present

## 2020-12-07 DIAGNOSIS — F17200 Nicotine dependence, unspecified, uncomplicated: Secondary | ICD-10-CM | POA: Diagnosis not present

## 2020-12-07 DIAGNOSIS — Z0001 Encounter for general adult medical examination with abnormal findings: Secondary | ICD-10-CM | POA: Diagnosis not present

## 2020-12-07 DIAGNOSIS — I10 Essential (primary) hypertension: Secondary | ICD-10-CM | POA: Diagnosis not present

## 2020-12-07 DIAGNOSIS — J301 Allergic rhinitis due to pollen: Secondary | ICD-10-CM | POA: Diagnosis not present

## 2020-12-07 DIAGNOSIS — D751 Secondary polycythemia: Secondary | ICD-10-CM | POA: Diagnosis not present

## 2020-12-28 DIAGNOSIS — G63 Polyneuropathy in diseases classified elsewhere: Secondary | ICD-10-CM | POA: Diagnosis not present

## 2020-12-28 DIAGNOSIS — D751 Secondary polycythemia: Secondary | ICD-10-CM | POA: Diagnosis not present

## 2020-12-28 DIAGNOSIS — G3184 Mild cognitive impairment, so stated: Secondary | ICD-10-CM | POA: Diagnosis not present

## 2020-12-28 DIAGNOSIS — I10 Essential (primary) hypertension: Secondary | ICD-10-CM | POA: Diagnosis not present

## 2021-03-01 DIAGNOSIS — D751 Secondary polycythemia: Secondary | ICD-10-CM | POA: Diagnosis not present

## 2021-03-01 DIAGNOSIS — I10 Essential (primary) hypertension: Secondary | ICD-10-CM | POA: Diagnosis not present

## 2021-03-01 DIAGNOSIS — M1990 Unspecified osteoarthritis, unspecified site: Secondary | ICD-10-CM | POA: Diagnosis not present

## 2021-03-01 DIAGNOSIS — G3184 Mild cognitive impairment, so stated: Secondary | ICD-10-CM | POA: Diagnosis not present

## 2021-03-01 DIAGNOSIS — G63 Polyneuropathy in diseases classified elsewhere: Secondary | ICD-10-CM | POA: Diagnosis not present

## 2021-03-01 DIAGNOSIS — J301 Allergic rhinitis due to pollen: Secondary | ICD-10-CM | POA: Diagnosis not present

## 2021-03-19 ENCOUNTER — Encounter: Payer: Self-pay | Admitting: Oncology

## 2021-03-20 ENCOUNTER — Encounter: Payer: Self-pay | Admitting: Oncology

## 2021-03-28 ENCOUNTER — Telehealth: Payer: Self-pay | Admitting: Oncology

## 2021-03-28 NOTE — Telephone Encounter (Signed)
Patient left message with answering service with questions about his appointment on 1/6 that has been canceled.  Based on Dr. Elroy Channel checkout note from July, he needs CBC with differential, CMP, CEA in 6 months in 1 year.  See Dr. Janese Banks in 1 year.  Possible phlebotomy each time.    Routing to clinical team for follow up.

## 2021-03-28 NOTE — Telephone Encounter (Signed)
I called and spoke to patient about how to get here. He says he has been to [facility] before and I told him in he comes here on [address] the cancer center is in the back of the hospital . There are signs at every entrance that he passes says what is at that spot and and arrow to keep going for cancer center. We have cancer center on the brick of our building and a sign at the entrance of the building and parking lot. There is a covered area that he can pull into and get free valet parking. If he does not want to do that then he can come in the parking lot and turn right and he can park anywhere in the area of parking place. He feels good about the directions and can call back if he needs to per pt.

## 2021-04-05 ENCOUNTER — Other Ambulatory Visit: Payer: Medicare Other

## 2021-05-08 ENCOUNTER — Encounter: Payer: Self-pay | Admitting: Nurse Practitioner

## 2021-08-09 DIAGNOSIS — I10 Essential (primary) hypertension: Secondary | ICD-10-CM | POA: Diagnosis not present

## 2021-08-12 DIAGNOSIS — D751 Secondary polycythemia: Secondary | ICD-10-CM | POA: Diagnosis not present

## 2021-08-12 DIAGNOSIS — G3184 Mild cognitive impairment, so stated: Secondary | ICD-10-CM | POA: Diagnosis not present

## 2021-08-12 DIAGNOSIS — G63 Polyneuropathy in diseases classified elsewhere: Secondary | ICD-10-CM | POA: Diagnosis not present

## 2021-08-12 DIAGNOSIS — J301 Allergic rhinitis due to pollen: Secondary | ICD-10-CM | POA: Diagnosis not present

## 2021-08-12 DIAGNOSIS — I10 Essential (primary) hypertension: Secondary | ICD-10-CM | POA: Diagnosis not present

## 2021-09-06 DIAGNOSIS — H26492 Other secondary cataract, left eye: Secondary | ICD-10-CM | POA: Diagnosis not present

## 2021-09-27 DIAGNOSIS — H26492 Other secondary cataract, left eye: Secondary | ICD-10-CM | POA: Diagnosis not present

## 2021-09-27 DIAGNOSIS — H26491 Other secondary cataract, right eye: Secondary | ICD-10-CM | POA: Diagnosis not present

## 2021-10-02 ENCOUNTER — Ambulatory Visit: Payer: Medicare Other | Admitting: Oncology

## 2021-10-02 ENCOUNTER — Inpatient Hospital Stay (HOSPITAL_BASED_OUTPATIENT_CLINIC_OR_DEPARTMENT_OTHER): Payer: Medicare Other | Admitting: Oncology

## 2021-10-02 ENCOUNTER — Encounter: Payer: Self-pay | Admitting: Oncology

## 2021-10-02 ENCOUNTER — Inpatient Hospital Stay: Payer: Medicare Other

## 2021-10-02 ENCOUNTER — Other Ambulatory Visit: Payer: Medicare Other

## 2021-10-02 ENCOUNTER — Inpatient Hospital Stay: Payer: Medicare Other | Attending: Oncology

## 2021-10-02 VITALS — BP 133/89 | HR 95 | Temp 98.3°F | Resp 16 | Wt 131.0 lb

## 2021-10-02 DIAGNOSIS — D751 Secondary polycythemia: Secondary | ICD-10-CM | POA: Insufficient documentation

## 2021-10-02 DIAGNOSIS — Z08 Encounter for follow-up examination after completed treatment for malignant neoplasm: Secondary | ICD-10-CM | POA: Insufficient documentation

## 2021-10-02 DIAGNOSIS — Z85038 Personal history of other malignant neoplasm of large intestine: Secondary | ICD-10-CM

## 2021-10-02 LAB — COMPREHENSIVE METABOLIC PANEL
ALT: 13 U/L (ref 0–44)
AST: 23 U/L (ref 15–41)
Albumin: 4.1 g/dL (ref 3.5–5.0)
Alkaline Phosphatase: 71 U/L (ref 38–126)
Anion gap: 10 (ref 5–15)
BUN: 12 mg/dL (ref 8–23)
CO2: 24 mmol/L (ref 22–32)
Calcium: 9.4 mg/dL (ref 8.9–10.3)
Chloride: 99 mmol/L (ref 98–111)
Creatinine, Ser: 1.11 mg/dL (ref 0.61–1.24)
GFR, Estimated: >60 mL/min (ref >60)
Glucose, Bld: 101 mg/dL — ABNORMAL HIGH (ref 70–99)
Potassium: 4.8 mmol/L (ref 3.5–5.1)
Sodium: 133 mmol/L — ABNORMAL LOW (ref 135–145)
Total Bilirubin: 0.8 mg/dL (ref 0.3–1.2)
Total Protein: 7.6 g/dL (ref 6.5–8.1)

## 2021-10-02 LAB — CBC WITH DIFFERENTIAL/PLATELET
Abs Immature Granulocytes: 0.01 10*3/uL (ref 0.00–0.07)
Basophils Absolute: 0.1 10*3/uL (ref 0.0–0.1)
Basophils Relative: 1 %
Eosinophils Absolute: 0 10*3/uL (ref 0.0–0.5)
Eosinophils Relative: 1 %
HCT: 47 % (ref 39.0–52.0)
Hemoglobin: 15.1 g/dL (ref 13.0–17.0)
Immature Granulocytes: 0 %
Lymphocytes Relative: 32 %
Lymphs Abs: 2 10*3/uL (ref 0.7–4.0)
MCH: 29.8 pg (ref 26.0–34.0)
MCHC: 32.1 g/dL (ref 30.0–36.0)
MCV: 92.9 fL (ref 80.0–100.0)
Monocytes Absolute: 0.5 10*3/uL (ref 0.1–1.0)
Monocytes Relative: 9 %
Neutro Abs: 3.6 10*3/uL (ref 1.7–7.7)
Neutrophils Relative %: 57 %
Platelets: 175 10*3/uL (ref 150–400)
RBC: 5.06 MIL/uL (ref 4.22–5.81)
RDW: 14.9 % (ref 11.5–15.5)
WBC: 6.2 10*3/uL (ref 4.0–10.5)
nRBC: 0 % (ref 0.0–0.2)

## 2021-10-02 NOTE — Progress Notes (Signed)
Pt states at times he experieneces spasms on his rt hand and at times his leg causes discomofrt like a "strained feeling"

## 2021-10-02 NOTE — Progress Notes (Signed)
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  Pathology showed invasive adenocarcinoma T3 N0 M0 with 17 negative lymph nodes.  There was 1 tumor deposit present which would be considered as N1 disease by today staging.  He received 12 cycles of adjuvant FOLFOX chemotherapy between October 2014 to May 2015.  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   CEA levels even at the time of colon cancer diagnosis was close to 10 and has remained elevated around 7 probably due to smoking.  Last CT scan in August 2021 did not show any evidence of recurrence disease.   Also has a history of secondary polycythemia which has been attributed to his smoking.  JAK2 and exon 12 mutation testing was negative.  He has undergone phlebotomies in the past to keep hematocrit less than 52.   He was found to have a left anterior chest wall mass which was biopsied in September 2021 and was a benign sebaceous cyst                Interval history-patient is doing well overall.  Appetite and weight have remained stable.  He did briefly stop smoking but has resumed smoking at this time.  ECOG PS- 1 Pain scale- 0   Review of systems- Review of Systems  Constitutional:  Negative for chills, fever, malaise/fatigue and weight loss.  HENT:  Negative for congestion, ear discharge and nosebleeds.   Eyes:  Negative for blurred  vision.  Respiratory:  Negative for cough, hemoptysis, sputum production, shortness of breath and wheezing.   Cardiovascular:  Negative for chest pain, palpitations, orthopnea and claudication.  Gastrointestinal:  Negative for abdominal pain, blood in stool, constipation, diarrhea, heartburn, melena, nausea and vomiting.  Genitourinary:  Negative for dysuria, flank pain, frequency, hematuria and urgency.  Musculoskeletal:  Negative for back pain, joint pain and myalgias.  Skin:  Negative for rash.  Neurological:  Negative for dizziness, tingling, focal weakness, seizures, weakness and headaches.  Endo/Heme/Allergies:  Does not bruise/bleed easily.  Psychiatric/Behavioral:  Negative for depression and suicidal ideas. The patient does not have insomnia.       No Known Allergies   Past Medical History:  Diagnosis Date   Arthritis    hands   Colon cancer ([place])    Emphysema of lung ([place])    mild (per pt)   Hypertension    Numbness    toes and fingers (S/P chemo - per pt)     Past Surgical History:  Procedure Laterality Date   AMPUTATION Right 2003   4th    CATARACT EXTRACTION W/PHACO Right 02/02/2017   Procedure: CATARACT EXTRACTION PHACO AND INTRAOCULAR LENS PLACEMENT ([place]) RIGHT;  Surgeon: Leandrew Koyanagi, MD;  Location: [place];  Service: Ophthalmology;  Laterality: Right;   CATARACT EXTRACTION W/PHACO Left 02/25/2017   Procedure: CATARACT EXTRACTION PHACO AND INTRAOCULAR LENS PLACEMENT ([place]) left;  Surgeon: Leandrew Koyanagi, MD;  Location: [facility];  Service: Ophthalmology;  Laterality: Left;   COLON SURGERY  2013   COLONOSCOPY     COLONOSCOPY WITH PROPOFOL N/A 07/16/2018   Procedure: COLONOSCOPY WITH PROPOFOL;  Surgeon: Lollie Sails, MD;  [facility] ENDOSCOPY;  Service: Endoscopy;  Laterality: N/A;   ESOPHAGOGASTRODUODENOSCOPY (EGD) WITH PROPOFOL N/A 07/16/2018   Procedure: ESOPHAGOGASTRODUODENOSCOPY (EGD) WITH PROPOFOL;  Surgeon:  Lollie Sails, MD;  Location: [facility] ENDOSCOPY;  Service: Endoscopy;  Laterality: N/A;    Social History   Socioeconomic History   Marital status: Divorced    Spouse name: Not on file   Number of children: Not on file   Years of education: Not on file   Highest education level: Not on file  Occupational History   Not on file  Tobacco Use   Smoking status: Every Day    Packs/day: 0.50    Years: 45.00    Total pack years: 22.50    Types: Cigarettes   Smokeless tobacco: Never   Tobacco comments:    since age 54  Vaping Use   Vaping Use: Never used  Substance and Sexual Activity   Alcohol use: Yes    Alcohol/week: 2.0 standard drinks of alcohol    Types: 2 Cans of beer per week   Drug use: No   Sexual activity: Not on file  Other Topics Concern   Not on file  Social History Narrative   Not on file   Social Determinants of Health   Financial Resource Strain: Not on file  Food Insecurity: Not on file  Transportation Needs: Not on file  Physical Activity: Not on file  Stress: Not on file  Social Connections: Not on file  Intimate Partner Violence: Not on file    Family History  Problem Relation Age of Onset   Cancer Maternal Grandfather      Current Outpatient Medications:    CELEBREX 200 MG capsule, Take 200 mg by mouth daily. , Disp: , Rfl: 0   cetirizine (ZYRTEC) 10 MG tablet, Take 1 tablet by mouth daily., Disp: , Rfl:    cyclobenzaprine (FLEXERIL) 5 MG tablet, Take 1 tablet (5 mg total) by mouth 3 (three) times daily as needed for muscle spasms., Disp: 30 tablet, Rfl: 0   fluticasone (FLONASE) 50 MCG/ACT nasal spray, Place 1 spray into both nostrils daily., Disp: , Rfl:    gabapentin (NEURONTIN) 300 MG capsule, Take 1 capsule by mouth daily. , Disp: , Rfl: 0   L-Methylfolate-Algae-B12-B6 (FOLTANX RF) 3-90.314-2-35 MG CAPS, Take 1 tablet by mouth daily. , Disp: , Rfl: 0   lisinopril (PRINIVIL,ZESTRIL) 20 MG tablet, Take 20 mg by mouth daily., Disp: , Rfl:  0   LYRICA 75 MG capsule, take 1 capsule by mouth twice a day, Disp: 60 capsule, Rfl: 3   Na Sulfate-K Sulfate-Mg Sulf 17.5-3.13-1.6 GM/177ML SOLN, Take by mouth daily., Disp: , Rfl:    omeprazole (PRILOSEC) 20 MG capsule, Take 20 mg by mouth daily., Disp: , Rfl:    sildenafil (REVATIO) 20 MG tablet, Take 2 tablets by mouth as needed. , Disp: , Rfl: 0   sildenafil (VIAGRA) 100 MG tablet, SMARTSIG:0.5 Tablet(s) By Mouth, Disp: , Rfl:    SPS 15 GM/60ML suspension, SMARTSIG:120 Milliliter(s) By Mouth Once (Patient not taking: Reported on 10/03/2020), Disp: , Rfl:  No current facility-administered medications for this visit.  Facility-Administered Medications Ordered in Other Visits:    heparin lock flush 100 unit/mL, 500 Units, Intravenous, Once, Corcoran, Drue Second, MD   sodium  chloride 0.9 % injection 10 mL, 10 mL, Intravenous, PRN, Mike Gip, Melissa C, MD, 10 mL at 12/13/14 1043   sodium chloride flush (NS) 0.9 % injection 10 mL, 10 mL, Intravenous, PRN, Mike Gip, Melissa C, MD, 10 mL at 08/20/16 1050   sodium chloride flush (NS) 0.9 % injection 10 mL, 10 mL, Intravenous, PRN, Mike Gip, Melissa C, MD, 10 mL at 02/03/18 1020   sodium chloride flush (NS) 0.9 % injection 10 mL, 10 mL, Intravenous, PRN, Lequita Asal, MD  Physical exam:  Vitals:   10/02/21 1408  BP: 133/89  Pulse: 95  Resp: 16  Temp: 98.3 F (36.8 C)  SpO2: 99%  Weight: 131 lb (59.4 kg)   Physical Exam Constitutional:      General: He is not in acute distress. Cardiovascular:     Rate and Rhythm: Normal rate and regular rhythm.     Heart sounds: Normal heart sounds.  Pulmonary:     Effort: Pulmonary effort is normal.     Breath sounds: Normal breath sounds.  Abdominal:     General: Bowel sounds are normal.     Palpations: Abdomen is soft.  Skin:    General: Skin is warm and dry.  Neurological:     Mental Status: He is alert and oriented to person, place, and time.         Latest Ref Rng & Units 10/02/2021     1:52 PM  CMP  Glucose 70 - 99 mg/dL 101   BUN 8 - 23 mg/dL 12   Creatinine 0.61 - 1.24 mg/dL 1.11   Sodium 135 - 145 mmol/L 133   Potassium 3.5 - 5.1 mmol/L 4.8   Chloride 98 - 111 mmol/L 99   CO2 22 - 32 mmol/L 24   Calcium 8.9 - 10.3 mg/dL 9.4   Total Protein 6.5 - 8.1 g/dL 7.6   Total Bilirubin 0.3 - 1.2 mg/dL 0.8   Alkaline Phos 38 - 126 U/L 71   AST 15 - 41 U/L 23   ALT 0 - 44 U/L 13       Latest Ref Rng & Units 10/02/2021    1:52 PM  CBC  WBC 4.0 - 10.5 K/uL 6.2   Hemoglobin 13.0 - 17.0 g/dL 15.1   Hematocrit 39.0 - 52.0 % 47.0   Platelets 150 - 400 K/uL 175      Assessment and plan- Patient is a 71 y.o. male who is here for follow-up of following issues  Stage II colon cancer: S/p surgery and adjuvant chemotherapy back in 2014.  It is now 9 years since his diagnosis of cancer and does not require any surveillance at this time including CEA or surveillance imaging.  Secondary polycythemia: Over the last 3 years his hemoglobin has been stable between 13-15 and not requiring a phlebotomy.  He can therefore continue to follow-up with his primary care doctor at this time.  No follow-up required with me   Visit Diagnosis 1. Polycythemia, secondary   2. Encounter for follow-up surveillance of colon cancer      Dr. Randa Evens, MD, MPH [facility] at [facility] [phone] 10/02/2021 3:42 PM

## 2021-10-03 LAB — CEA: CEA: 6.8 ng/mL — ABNORMAL HIGH (ref 0.0–4.7)

## 2021-11-14 DIAGNOSIS — Z0001 Encounter for general adult medical examination with abnormal findings: Secondary | ICD-10-CM | POA: Diagnosis not present

## 2021-11-14 DIAGNOSIS — R7301 Impaired fasting glucose: Secondary | ICD-10-CM | POA: Diagnosis not present

## 2021-11-14 DIAGNOSIS — I10 Essential (primary) hypertension: Secondary | ICD-10-CM | POA: Diagnosis not present

## 2021-11-18 DIAGNOSIS — G63 Polyneuropathy in diseases classified elsewhere: Secondary | ICD-10-CM | POA: Diagnosis not present

## 2021-11-18 DIAGNOSIS — D696 Thrombocytopenia, unspecified: Secondary | ICD-10-CM | POA: Diagnosis not present

## 2021-11-18 DIAGNOSIS — F17210 Nicotine dependence, cigarettes, uncomplicated: Secondary | ICD-10-CM | POA: Diagnosis not present

## 2021-11-18 DIAGNOSIS — E875 Hyperkalemia: Secondary | ICD-10-CM | POA: Diagnosis not present

## 2021-11-18 DIAGNOSIS — M1990 Unspecified osteoarthritis, unspecified site: Secondary | ICD-10-CM | POA: Diagnosis not present

## 2021-11-18 DIAGNOSIS — G3184 Mild cognitive impairment, so stated: Secondary | ICD-10-CM | POA: Diagnosis not present

## 2021-11-18 DIAGNOSIS — D751 Secondary polycythemia: Secondary | ICD-10-CM | POA: Diagnosis not present

## 2021-11-18 DIAGNOSIS — J301 Allergic rhinitis due to pollen: Secondary | ICD-10-CM | POA: Diagnosis not present

## 2021-11-18 DIAGNOSIS — I10 Essential (primary) hypertension: Secondary | ICD-10-CM | POA: Diagnosis not present

## 2022-02-14 DIAGNOSIS — R7301 Impaired fasting glucose: Secondary | ICD-10-CM | POA: Diagnosis not present

## 2022-02-14 DIAGNOSIS — I10 Essential (primary) hypertension: Secondary | ICD-10-CM | POA: Diagnosis not present

## 2022-02-14 DIAGNOSIS — E875 Hyperkalemia: Secondary | ICD-10-CM | POA: Diagnosis not present

## 2022-02-18 DIAGNOSIS — I10 Essential (primary) hypertension: Secondary | ICD-10-CM | POA: Diagnosis not present

## 2022-02-18 DIAGNOSIS — K219 Gastro-esophageal reflux disease without esophagitis: Secondary | ICD-10-CM | POA: Diagnosis not present

## 2022-02-18 DIAGNOSIS — J301 Allergic rhinitis due to pollen: Secondary | ICD-10-CM | POA: Diagnosis not present

## 2022-02-18 DIAGNOSIS — D751 Secondary polycythemia: Secondary | ICD-10-CM | POA: Diagnosis not present

## 2022-02-18 DIAGNOSIS — G3184 Mild cognitive impairment, so stated: Secondary | ICD-10-CM | POA: Diagnosis not present

## 2022-02-18 DIAGNOSIS — G63 Polyneuropathy in diseases classified elsewhere: Secondary | ICD-10-CM | POA: Diagnosis not present

## 2022-02-18 DIAGNOSIS — F17210 Nicotine dependence, cigarettes, uncomplicated: Secondary | ICD-10-CM | POA: Diagnosis not present

## 2022-03-17 DIAGNOSIS — K219 Gastro-esophageal reflux disease without esophagitis: Secondary | ICD-10-CM | POA: Diagnosis not present

## 2022-03-17 DIAGNOSIS — D751 Secondary polycythemia: Secondary | ICD-10-CM | POA: Diagnosis not present

## 2022-03-17 DIAGNOSIS — I10 Essential (primary) hypertension: Secondary | ICD-10-CM | POA: Diagnosis not present

## 2022-03-17 DIAGNOSIS — G3184 Mild cognitive impairment, so stated: Secondary | ICD-10-CM | POA: Diagnosis not present

## 2022-03-17 DIAGNOSIS — G63 Polyneuropathy in diseases classified elsewhere: Secondary | ICD-10-CM | POA: Diagnosis not present

## 2022-03-17 DIAGNOSIS — Z23 Encounter for immunization: Secondary | ICD-10-CM | POA: Diagnosis not present

## 2022-03-17 DIAGNOSIS — M1990 Unspecified osteoarthritis, unspecified site: Secondary | ICD-10-CM | POA: Diagnosis not present

## 2022-03-17 DIAGNOSIS — Z0001 Encounter for general adult medical examination with abnormal findings: Secondary | ICD-10-CM | POA: Diagnosis not present

## 2022-03-17 DIAGNOSIS — F17200 Nicotine dependence, unspecified, uncomplicated: Secondary | ICD-10-CM | POA: Diagnosis not present

## 2022-05-09 ENCOUNTER — Other Ambulatory Visit: Payer: Self-pay | Admitting: Internal Medicine

## 2022-05-14 ENCOUNTER — Other Ambulatory Visit: Payer: Self-pay | Admitting: Internal Medicine

## 2022-05-14 ENCOUNTER — Encounter: Payer: Self-pay | Admitting: Nurse Practitioner

## 2022-05-14 ENCOUNTER — Other Ambulatory Visit: Payer: Self-pay

## 2022-05-14 DIAGNOSIS — I10 Essential (primary) hypertension: Secondary | ICD-10-CM | POA: Diagnosis not present

## 2022-05-14 DIAGNOSIS — D696 Thrombocytopenia, unspecified: Secondary | ICD-10-CM | POA: Diagnosis not present

## 2022-05-15 LAB — COMPREHENSIVE METABOLIC PANEL
ALT: 9 IU/L (ref 0–44)
AST: 22 IU/L (ref 0–40)
Albumin/Globulin Ratio: 1.5 (ref 1.2–2.2)
Albumin: 4.6 g/dL (ref 3.9–4.9)
Alkaline Phosphatase: 114 IU/L (ref 44–121)
BUN/Creatinine Ratio: 10 (ref 10–24)
BUN: 11 mg/dL (ref 8–27)
Bilirubin Total: 0.7 mg/dL (ref 0.0–1.2)
CO2: 24 mmol/L (ref 20–29)
Calcium: 10.3 mg/dL — ABNORMAL HIGH (ref 8.6–10.2)
Chloride: 99 mmol/L (ref 96–106)
Creatinine, Ser: 1.15 mg/dL (ref 0.76–1.27)
Globulin, Total: 3.1 g/dL (ref 1.5–4.5)
Glucose: 98 mg/dL (ref 70–99)
Potassium: 5.1 mmol/L (ref 3.5–5.2)
Sodium: 139 mmol/L (ref 134–144)
Total Protein: 7.7 g/dL (ref 6.0–8.5)
eGFR: 68 mL/min/{1.73_m2} (ref >59)

## 2022-05-15 LAB — CBC
Hematocrit: 49.4 % (ref 37.5–51.0)
Hemoglobin: 16.3 g/dL (ref 13.0–17.7)
MCH: 29.8 pg (ref 26.6–33.0)
MCHC: 33 g/dL (ref 31.5–35.7)
MCV: 90 fL (ref 79–97)
RBC: 5.47 x10E6/uL (ref 4.14–5.80)
RDW: 13.8 % (ref 11.6–15.4)
WBC: 4.7 10*3/uL (ref 3.4–10.8)

## 2022-05-19 ENCOUNTER — Ambulatory Visit (INDEPENDENT_AMBULATORY_CARE_PROVIDER_SITE_OTHER): Payer: Medicare Other | Admitting: Internal Medicine

## 2022-05-19 VITALS — BP 116/60 | HR 95 | Ht 73.0 in | Wt 132.0 lb

## 2022-05-19 DIAGNOSIS — F17200 Nicotine dependence, unspecified, uncomplicated: Secondary | ICD-10-CM

## 2022-05-19 DIAGNOSIS — C189 Malignant neoplasm of colon, unspecified: Secondary | ICD-10-CM

## 2022-05-19 DIAGNOSIS — D751 Secondary polycythemia: Secondary | ICD-10-CM | POA: Diagnosis not present

## 2022-05-19 DIAGNOSIS — J3089 Other allergic rhinitis: Secondary | ICD-10-CM | POA: Diagnosis not present

## 2022-05-19 MED ORDER — FLUTICASONE PROPIONATE 50 MCG/ACT NA SUSP: 1.0000 | Freq: Two times a day (BID) | NASAL | 0 refills | Status: DC

## 2022-05-19 NOTE — Progress Notes (Signed)
Established Patient Office Visit  Subjective:  Patient ID: William Ford, male    DOB: [birth year]  Age: 72 y.o. MRN: [id number]  Chief Complaint  Patient presents with   Follow-up    No new complaints, here for lab review and medication refills.Labs reviewed and notable for unremarkable cmp and cbc. Endorses improvement in nasal drip with fluticasone but relief only lasts half a day.      Past Medical History:  Diagnosis Date   Arthritis    hands   Colon cancer ([place])    Emphysema of lung ([place])    mild (per pt)   Hypertension    Numbness    toes and fingers ([facility] [place]/P chemo - per pt)    Social History   Socioeconomic History   Marital status: Divorced    Spouse name: Not on file   Number of children: Not on file   Years of education: Not on file   Highest education level: Not on file  Occupational History   Not on file  Tobacco Use   Smoking status: Every Day    Packs/day: 0.50    Years: 45.00    Total pack years: 22.50    Types: Cigarettes   Smokeless tobacco: Never   Tobacco comments:    since age 18  Vaping Use   Vaping Use: Never used  Substance and Sexual Activity   Alcohol use: Yes    Alcohol/week: 2.0 standard drinks of alcohol    Types: 2 Cans of beer per week   Drug use: No   Sexual activity: Not on file  Other Topics Concern   Not on file  Social History Narrative   Not on file   Social Determinants of Health   Financial Resource Strain: Not on file  Food Insecurity: Not on file  Transportation Needs: Not on file  Physical Activity: Not on file  Stress: Not on file  Social Connections: Not on file  Intimate Partner Violence: Not on file    Family History  Problem Relation Age of Onset   Cancer Maternal Grandfather     No Known Allergies  Review of Systems  Constitutional: Negative.   HENT: Negative.    Eyes: Negative.   Respiratory: Negative.    Cardiovascular: Negative.   Gastrointestinal: Negative.   Genitourinary: Negative.    Skin: Negative.   Neurological: Negative.   Endo/Heme/Allergies: Negative.        Objective:   BP 116/60   Pulse 95   Ht 6' 1"$  (1.854 m)   Wt 132 lb (59.9 kg)   SpO2 95%   BMI 17.42 kg/m   Vitals:   05/19/22 1140  BP: 116/60  Pulse: 95  Height: 6' 1"$  (1.854 m)  Weight: 132 lb (59.9 kg)  SpO2: 95%  BMI (Calculated): 17.42    Physical Exam Vitals reviewed.  Constitutional:      Appearance: Normal appearance.  HENT:     Head: Normocephalic.     Left Ear: There is no impacted cerumen.     Nose: Nose normal.     Mouth/Throat:     Mouth: Mucous membranes are moist.     Pharynx: No posterior oropharyngeal erythema.  Eyes:     Extraocular Movements: Extraocular movements intact.     Pupils: Pupils are equal, round, and reactive to light.  Cardiovascular:     Rate and Rhythm: Regular rhythm.     Chest Wall: PMI is not displaced.     Pulses: Normal pulses.  Heart sounds: Normal heart sounds. No murmur heard. Pulmonary:     Effort: Pulmonary effort is normal.     Breath sounds: Normal air entry. Examination of the right-lower field reveals rales. Examination of the left-lower field reveals rales. Rales present. No rhonchi.     Comments: coarse Abdominal:     General: Abdomen is flat. Bowel sounds are normal. There is no distension.     Palpations: Abdomen is soft. There is no hepatomegaly, splenomegaly or mass.     Tenderness: There is no abdominal tenderness.  Musculoskeletal:        General: Normal range of motion.     Cervical back: Normal range of motion and neck supple.     Right lower leg: No edema.     Left lower leg: No edema.  Skin:    General: Skin is warm and dry.  Neurological:     General: No focal deficit present.     Mental Status: He is alert and oriented to person, place, and time.     Cranial Nerves: No cranial nerve deficit.     Motor: No weakness.  Psychiatric:        Mood and Affect: Mood normal.        Behavior: Behavior normal.       No results found for any visits on 05/19/22.  Recent Results (from the past 2160 hour(Bubber Rothert))  Comprehensive metabolic panel     Status: Abnormal   Collection Time: 05/14/22  9:40 AM  Result Value Ref Range   Glucose 98 70 - 99 mg/dL   BUN 11 8 - 27 mg/dL   Creatinine, Ser 1.15 0.76 - 1.27 mg/dL   eGFR 68 >59 mL/min/1.73   BUN/Creatinine Ratio 10 10 - 24   Sodium 139 134 - 144 mmol/L   Potassium 5.1 3.5 - 5.2 mmol/L   Chloride 99 96 - 106 mmol/L   CO2 24 20 - 29 mmol/L   Calcium 10.3 (H) 8.6 - 10.2 mg/dL   Total Protein 7.7 6.0 - 8.5 g/dL   Albumin 4.6 3.9 - 4.9 g/dL   Globulin, Total 3.1 1.5 - 4.5 g/dL   Albumin/Globulin Ratio 1.5 1.2 - 2.2   Bilirubin Total 0.7 0.0 - 1.2 mg/dL   Alkaline Phosphatase 114 44 - 121 IU/L   AST 22 0 - 40 IU/L   ALT 9 0 - 44 IU/L  CBC     Status: None   Collection Time: 05/14/22  9:40 AM  Result Value Ref Range   WBC 4.7 3.4 - 10.8 x10E3/uL   RBC 5.47 4.14 - 5.80 x10E6/uL   Hemoglobin 16.3 13.0 - 17.7 g/dL   Hematocrit 49.4 37.5 - 51.0 %   MCV 90 79 - 97 fL   MCH 29.8 26.6 - 33.0 pg   MCHC 33.0 31.5 - 35.7 g/dL   RDW 13.8 11.6 - 15.4 %   Platelets CANCELED x10E3/uL    Comment: Unable to perform an accurate platelet count due to aggregation of the platelets.  Result canceled by the ancillary.    Hematology Comments: Note:     Comment: Verified by microscopic examination.      Assessment & Plan:   Problem List Items Addressed This Visit   None   No follow-ups on file.   Total time spent: 20 minutes  Volanda Napoleon, MD  05/19/2022

## 2022-07-02 ENCOUNTER — Other Ambulatory Visit: Payer: Self-pay | Admitting: Internal Medicine

## 2022-07-17 ENCOUNTER — Other Ambulatory Visit: Payer: Self-pay | Admitting: Internal Medicine

## 2022-08-15 ENCOUNTER — Other Ambulatory Visit: Payer: Self-pay

## 2022-08-15 DIAGNOSIS — R7301 Impaired fasting glucose: Secondary | ICD-10-CM

## 2022-08-15 DIAGNOSIS — E785 Hyperlipidemia, unspecified: Secondary | ICD-10-CM

## 2022-08-15 DIAGNOSIS — N400 Enlarged prostate without lower urinary tract symptoms: Secondary | ICD-10-CM

## 2022-08-15 DIAGNOSIS — I10 Essential (primary) hypertension: Secondary | ICD-10-CM

## 2022-08-16 LAB — CMP14+EGFR
ALT: 12 IU/L (ref 0–44)
AST: 24 IU/L (ref 0–40)
Albumin/Globulin Ratio: 1.3 (ref 1.2–2.2)
Albumin: 4.5 g/dL (ref 3.9–4.9)
Alkaline Phosphatase: 110 IU/L (ref 44–121)
BUN/Creatinine Ratio: 9 — ABNORMAL LOW (ref 10–24)
BUN: 10 mg/dL (ref 8–27)
Bilirubin Total: 0.7 mg/dL (ref 0.0–1.2)
CO2: 24 mmol/L (ref 20–29)
Calcium: 10.1 mg/dL (ref 8.6–10.2)
Chloride: 97 mmol/L (ref 96–106)
Creatinine, Ser: 1.09 mg/dL (ref 0.76–1.27)
Globulin, Total: 3.6 g/dL (ref 1.5–4.5)
Glucose: 97 mg/dL (ref 70–99)
Potassium: 5.4 mmol/L — ABNORMAL HIGH (ref 3.5–5.2)
Sodium: 138 mmol/L (ref 134–144)
Total Protein: 8.1 g/dL (ref 6.0–8.5)
eGFR: 73 mL/min/{1.73_m2} (ref >59)

## 2022-08-16 LAB — CBC WITH DIFFERENTIAL
Basophils Absolute: 0.1 10*3/uL (ref 0.0–0.2)
Basos: 1 %
EOS (ABSOLUTE): 0.1 10*3/uL (ref 0.0–0.4)
Eos: 1 %
Hematocrit: 50.4 % (ref 37.5–51.0)
Hemoglobin: 16.4 g/dL (ref 13.0–17.7)
Immature Grans (Abs): 0 10*3/uL (ref 0.0–0.1)
Immature Granulocytes: 0 %
Lymphocytes Absolute: 1.8 10*3/uL (ref 0.7–3.1)
Lymphs: 34 %
MCH: 29.2 pg (ref 26.6–33.0)
MCHC: 32.5 g/dL (ref 31.5–35.7)
MCV: 90 fL (ref 79–97)
Monocytes Absolute: 0.5 10*3/uL (ref 0.1–0.9)
Monocytes: 10 %
Neutrophils Absolute: 2.8 10*3/uL (ref 1.4–7.0)
Neutrophils: 54 %
RBC: 5.61 x10E6/uL (ref 4.14–5.80)
RDW: 14.3 % (ref 11.6–15.4)
WBC: 5.3 10*3/uL (ref 3.4–10.8)

## 2022-08-16 LAB — TSH: TSH: 0.763 u[IU]/mL (ref 0.450–4.500)

## 2022-08-16 LAB — PSA: Prostate Specific Ag, Serum: 1.1 ng/mL (ref 0.0–4.0)

## 2022-08-16 LAB — LIPID PANEL
Chol/HDL Ratio: 2.7 ratio (ref 0.0–5.0)
Cholesterol, Total: 162 mg/dL (ref 100–199)
HDL: 61 mg/dL (ref >39)
LDL Chol Calc (NIH): 85 mg/dL (ref 0–99)
Triglycerides: 88 mg/dL (ref 0–149)
VLDL Cholesterol Cal: 16 mg/dL (ref 5–40)

## 2022-08-16 LAB — HEMOGLOBIN A1C
Est. average glucose Bld gHb Est-mCnc: 126 mg/dL
Hgb A1c MFr Bld: 6 % — ABNORMAL HIGH (ref 4.8–5.6)

## 2022-08-19 ENCOUNTER — Ambulatory Visit (INDEPENDENT_AMBULATORY_CARE_PROVIDER_SITE_OTHER): Payer: Medicare Other | Admitting: Internal Medicine

## 2022-08-19 ENCOUNTER — Encounter: Payer: Self-pay | Admitting: Internal Medicine

## 2022-08-19 VITALS — BP 90/70 | HR 103 | Ht 73.0 in | Wt 132.4 lb

## 2022-08-19 DIAGNOSIS — F17200 Nicotine dependence, unspecified, uncomplicated: Secondary | ICD-10-CM

## 2022-08-19 DIAGNOSIS — I10 Essential (primary) hypertension: Secondary | ICD-10-CM | POA: Diagnosis not present

## 2022-08-19 DIAGNOSIS — J3089 Other allergic rhinitis: Secondary | ICD-10-CM | POA: Diagnosis not present

## 2022-08-19 DIAGNOSIS — J410 Simple chronic bronchitis: Secondary | ICD-10-CM

## 2022-08-19 DIAGNOSIS — D751 Secondary polycythemia: Secondary | ICD-10-CM

## 2022-08-19 DIAGNOSIS — K219 Gastro-esophageal reflux disease without esophagitis: Secondary | ICD-10-CM | POA: Diagnosis not present

## 2022-08-19 DIAGNOSIS — F17210 Nicotine dependence, cigarettes, uncomplicated: Secondary | ICD-10-CM | POA: Diagnosis not present

## 2022-08-19 MED ORDER — OMEPRAZOLE 20 MG PO CPDR
20.0000 mg | DELAYED_RELEASE_CAPSULE | Freq: Every day | ORAL | 0 refills | Status: DC
Start: 2022-08-19 — End: 2022-11-19

## 2022-08-19 MED ORDER — LISINOPRIL 20 MG PO TABS: 20.0000 mg | ORAL_TABLET | Freq: Every morning | ORAL | 1 refills | Status: DC

## 2022-08-19 MED ORDER — TRELEGY ELLIPTA 100-62.5-25 MCG/ACT IN AEPB
1.0000 | INHALATION_SPRAY | Freq: Every day | RESPIRATORY_TRACT | 2 refills | Status: AC
Start: 2022-08-19 — End: 2022-11-11

## 2022-08-19 MED ORDER — FLUTICASONE PROPIONATE 50 MCG/ACT NA SUSP
1.0000 | Freq: Two times a day (BID) | NASAL | 0 refills | Status: DC
Start: 2022-08-19 — End: 2023-03-20

## 2022-08-19 MED ORDER — FLUTICASONE PROPIONATE 50 MCG/ACT NA SUSP
1.0000 | Freq: Two times a day (BID) | NASAL | 0 refills | Status: DC
Start: 2022-08-19 — End: 2022-08-19

## 2022-08-19 MED ORDER — OMEPRAZOLE 20 MG PO CPDR
20.0000 mg | DELAYED_RELEASE_CAPSULE | Freq: Every day | ORAL | 0 refills | Status: DC
Start: 2022-08-19 — End: 2022-08-19

## 2022-08-19 MED ORDER — LISINOPRIL 20 MG PO TABS
20.0000 mg | ORAL_TABLET | Freq: Every morning | ORAL | 1 refills | Status: DC
Start: 2022-08-19 — End: 2023-03-20

## 2022-08-19 MED ORDER — TRELEGY ELLIPTA 100-62.5-25 MCG/ACT IN AEPB
1.0000 | INHALATION_SPRAY | Freq: Every day | RESPIRATORY_TRACT | 2 refills | Status: DC
Start: 2022-08-19 — End: 2022-08-19

## 2022-08-19 NOTE — Progress Notes (Addendum)
Established Patient Office Visit  Subjective:  Patient ID: William Ford, male    DOB: 07/12/1950  Age: 72 y.o. MRN: [id number]  Chief Complaint  Patient presents with   Follow-up    3 month follow up    No new complaints, here for lab review and medication refills. Labs reviewed and notable for well controlled lipids, A1c in the prediabetic range and unremarkable lfts, CK and PSA.    No other concerns at this time.   Past Medical History:  Diagnosis Date   Arthritis    hands   Colon cancer (HCC)    Emphysema of lung (HCC)    mild (per pt)   Hypertension    Numbness    toes and fingers (Statia Burdick/P chemo - per pt)    Past Surgical History:  Procedure Laterality Date   AMPUTATION Right 2003   4th    CATARACT EXTRACTION W/PHACO Right 02/02/2017   Procedure: CATARACT EXTRACTION PHACO AND INTRAOCULAR LENS PLACEMENT (IOC) RIGHT;  Surgeon: Lockie Mola, MD;  Location: [facility];  Service: Ophthalmology;  Laterality: Right;   CATARACT EXTRACTION W/PHACO Left 02/25/2017   Procedure: CATARACT EXTRACTION PHACO AND INTRAOCULAR LENS PLACEMENT (IOC) left;  Surgeon: Lockie Mola, MD;  Location: [facility];  Service: Ophthalmology;  Laterality: Left;   COLON SURGERY  2013   COLONOSCOPY     COLONOSCOPY WITH PROPOFOL N/A 07/16/2018   Procedure: COLONOSCOPY WITH PROPOFOL;  Surgeon: Christena Deem, MD;  Location: [facility];  Service: Endoscopy;  Laterality: N/A;   ESOPHAGOGASTRODUODENOSCOPY (EGD) WITH PROPOFOL N/A 07/16/2018   Procedure: ESOPHAGOGASTRODUODENOSCOPY (EGD) WITH PROPOFOL;  Surgeon: Christena Deem, MD;  Location: [facility] ENDOSCOPY;  Service: Endoscopy;  Laterality: N/A;    Social History   Socioeconomic History   Marital status: Divorced    Spouse name: Not on file   Number of children: Not on file   Years of education: Not on file   Highest education level: Not on file  Occupational History   Not on file  Tobacco Use   Smoking  status: Every Day    Packs/day: 0.50    Years: 45.00    Additional pack years: 0.00    Total pack years: 22.50    Types: Cigarettes   Smokeless tobacco: Never   Tobacco comments:    since age 19  Vaping Use   Vaping Use: Never used  Substance and Sexual Activity   Alcohol use: Yes    Alcohol/week: 2.0 standard drinks of alcohol    Types: 2 Cans of beer per week   Drug use: No   Sexual activity: Not on file  Other Topics Concern   Not on file  Social History Narrative   Not on file   Social Determinants of Health   Financial Resource Strain: Not on file  Food Insecurity: Not on file  Transportation Needs: Not on file  Physical Activity: Not on file  Stress: Not on file  Social Connections: Not on file  Intimate Partner Violence: Not on file    Family History  Problem Relation Age of Onset   Cancer Maternal Grandfather     No Known Allergies  Review of Systems  Constitutional: Negative.   HENT: Negative.    Eyes: Negative.   Respiratory:  Positive for shortness of breath.   Cardiovascular: Negative.   Gastrointestinal: Negative.   Genitourinary: Negative.   Skin: Negative.   Neurological: Negative.   Endo/Heme/Allergies: Negative.        Objective:  BP 90/70   Pulse (!) 103   Ht 6\' 1"  (1.854 m)   Wt 132 lb 6.4 oz (60.1 kg)   SpO2 92%   BMI 17.47 kg/m   Vitals:   08/19/22 1004  BP: 90/70  Pulse: (!) 103  Height: 6\' 1"  (1.854 m)  Weight: 132 lb 6.4 oz (60.1 kg)  SpO2: 92%  BMI (Calculated): 17.47    Physical Exam Vitals reviewed.  Constitutional:      Appearance: Normal appearance.  HENT:     Head: Normocephalic.     Left Ear: There is no impacted cerumen.     Nose: Nose normal.     Mouth/Throat:     Mouth: Mucous membranes are moist.     Pharynx: No posterior oropharyngeal erythema.  Eyes:     Extraocular Movements: Extraocular movements intact.     Pupils: Pupils are equal, round, and reactive to light.  Cardiovascular:     Rate  and Rhythm: Regular rhythm.     Chest Wall: PMI is not displaced.     Pulses: Normal pulses.     Heart sounds: Normal heart sounds. No murmur heard. Pulmonary:     Effort: Pulmonary effort is normal.     Breath sounds: Normal air entry. Examination of the right-lower field reveals rales. Examination of the left-lower field reveals rales. Rales present. No rhonchi.     Comments: coarse Abdominal:     General: Abdomen is flat. Bowel sounds are normal. There is no distension.     Palpations: Abdomen is soft. There is no hepatomegaly, splenomegaly or mass.     Tenderness: There is no abdominal tenderness.  Musculoskeletal:        General: Normal range of motion.     Cervical back: Normal range of motion and neck supple.     Right lower leg: No edema.     Left lower leg: No edema.  Skin:    General: Skin is warm and dry.  Neurological:     General: No focal deficit present.     Mental Status: He is alert and oriented to person, place, and time.     Cranial Nerves: No cranial nerve deficit.     Motor: No weakness.  Psychiatric:        Mood and Affect: Mood normal.        Behavior: Behavior normal.      No results found for any visits on 08/19/22.  Recent Results (from the past 2160 hour(Trever Streater))  Hemoglobin A1c     Status: Abnormal   Collection Time: 08/15/22  9:41 AM  Result Value Ref Range   Hgb A1c MFr Bld 6.0 (H) 4.8 - 5.6 %    Comment:          Prediabetes: 5.7 - 6.4          Diabetes: >6.4          Glycemic control for adults with diabetes: <7.0    Est. average glucose Bld gHb Est-mCnc 126 mg/dL  TSH     Status: None   Collection Time: 08/15/22  9:41 AM  Result Value Ref Range   TSH 0.763 0.450 - 4.500 uIU/mL  CMP14+EGFR     Status: Abnormal   Collection Time: 08/15/22  9:41 AM  Result Value Ref Range   Glucose 97 70 - 99 mg/dL   BUN 10 8 - 27 mg/dL   Creatinine, Ser 1.61 0.76 - 1.27 mg/dL   eGFR 73 >09 UE/AVW/0.98  BUN/Creatinine Ratio 9 (L) 10 - 24   Sodium 138  134 - 144 mmol/L   Potassium 5.4 (H) 3.5 - 5.2 mmol/L   Chloride 97 96 - 106 mmol/L   CO2 24 20 - 29 mmol/L   Calcium 10.1 8.6 - 10.2 mg/dL   Total Protein 8.1 6.0 - 8.5 g/dL   Albumin 4.5 3.9 - 4.9 g/dL   Globulin, Total 3.6 1.5 - 4.5 g/dL   Albumin/Globulin Ratio 1.3 1.2 - 2.2   Bilirubin Total 0.7 0.0 - 1.2 mg/dL   Alkaline Phosphatase 110 44 - 121 IU/L   AST 24 0 - 40 IU/L   ALT 12 0 - 44 IU/L  Lipid panel     Status: None   Collection Time: 08/15/22  9:41 AM  Result Value Ref Range   Cholesterol, Total 162 100 - 199 mg/dL   Triglycerides 88 0 - 149 mg/dL   HDL 61 >40 mg/dL   VLDL Cholesterol Cal 16 5 - 40 mg/dL   LDL Chol Calc (NIH) 85 0 - 99 mg/dL   Chol/HDL Ratio 2.7 0.0 - 5.0 ratio    Comment:                                   T. Chol/HDL Ratio                                             Men  Women                               1/2 Avg.Risk  3.4    3.3                                   Avg.Risk  5.0    4.4                                2X Avg.Risk  9.6    7.1                                3X Avg.Risk 23.4   11.0   CBC With Differential     Status: None   Collection Time: 08/15/22  9:41 AM  Result Value Ref Range   WBC 5.3 3.4 - 10.8 x10E3/uL   RBC 5.61 4.14 - 5.80 x10E6/uL   Hemoglobin 16.4 13.0 - 17.7 g/dL   Hematocrit 98.1 19.1 - 51.0 %   MCV 90 79 - 97 fL   MCH 29.2 26.6 - 33.0 pg   MCHC 32.5 31.5 - 35.7 g/dL   RDW 47.8 29.5 - 62.1 %   Neutrophils 54 Not Estab. %   Lymphs 34 Not Estab. %   Monocytes 10 Not Estab. %   Eos 1 Not Estab. %   Basos 1 Not Estab. %   Neutrophils Absolute 2.8 1.4 - 7.0 x10E3/uL   Lymphocytes Absolute 1.8 0.7 - 3.1 x10E3/uL   Monocytes Absolute 0.5 0.1 - 0.9 x10E3/uL   EOS (ABSOLUTE) 0.1 0.0 - 0.4 x10E3/uL   Basophils Absolute 0.1 0.0 -  0.2 x10E3/uL   Immature Granulocytes 0 Not Estab. %   Immature Grans (Abs) 0.0 0.0 - 0.1 x10E3/uL  PSA     Status: None   Collection Time: 08/15/22  9:41 AM  Result Value Ref Range   Prostate  Specific Ag, Serum 1.1 0.0 - 4.0 ng/mL    Comment: Roche ECLIA methodology. According to the American Urological Association, Serum PSA should decrease and remain at undetectable levels after radical prostatectomy. The AUA defines biochemical recurrence as an initial PSA value 0.2 ng/mL or greater followed by a subsequent confirmatory PSA value 0.2 ng/mL or greater. Values obtained with different assay methods or kits cannot be used interchangeably. Results cannot be interpreted as absolute evidence of the presence or absence of malignant disease.       Assessment & Plan:  As per problem list. Problem List Items Addressed This Visit       Cardiovascular and Mediastinum   Primary hypertension   Relevant Medications   lisinopril (ZESTRIL) 20 MG tablet     Respiratory   Simple chronic bronchitis (HCC)   Relevant Medications   Fluticasone-Umeclidin-Vilant (TRELEGY ELLIPTA) 100-62.5-25 MCG/ACT AEPB     Digestive   Gastroesophageal reflux disease   Relevant Medications   omeprazole (PRILOSEC) 20 MG capsule     Other   Polycythemia, secondary   Tobacco use disorder - Primary   Other Visit Diagnoses     Non-seasonal allergic rhinitis, unspecified trigger       Relevant Medications   fluticasone (FLONASE) 50 MCG/ACT nasal spray      Smoking cessation instruction/counseling given:  counseled patient on the dangers of tobacco use, advised patient to stop smoking, and reviewed strategies to maximize success  Return in about 3 months (around 11/19/2022) for BP followup.   Total time spent: 20 minutes  Luna Fuse, MD  08/19/2022   This document may have been prepared by [facility] Voice Recognition software and as such may include unintentional dictation errors.

## 2022-08-20 ENCOUNTER — Other Ambulatory Visit: Payer: Self-pay | Admitting: Internal Medicine

## 2022-08-20 DIAGNOSIS — K219 Gastro-esophageal reflux disease without esophagitis: Secondary | ICD-10-CM

## 2022-10-03 ENCOUNTER — Other Ambulatory Visit: Payer: Self-pay | Admitting: Internal Medicine

## 2022-10-16 ENCOUNTER — Other Ambulatory Visit: Payer: Self-pay | Admitting: Nurse Practitioner

## 2022-11-04 DIAGNOSIS — Z961 Presence of intraocular lens: Secondary | ICD-10-CM | POA: Diagnosis not present

## 2022-11-04 DIAGNOSIS — H35373 Puckering of macula, bilateral: Secondary | ICD-10-CM | POA: Diagnosis not present

## 2022-11-19 ENCOUNTER — Encounter: Payer: Self-pay | Admitting: Internal Medicine

## 2022-11-19 ENCOUNTER — Ambulatory Visit (INDEPENDENT_AMBULATORY_CARE_PROVIDER_SITE_OTHER): Payer: Medicare Other | Admitting: Internal Medicine

## 2022-11-19 VITALS — BP 128/82 | HR 66 | Ht 73.0 in | Wt 136.8 lb

## 2022-11-19 DIAGNOSIS — M2550 Pain in unspecified joint: Secondary | ICD-10-CM | POA: Diagnosis not present

## 2022-11-19 DIAGNOSIS — F17200 Nicotine dependence, unspecified, uncomplicated: Secondary | ICD-10-CM | POA: Diagnosis not present

## 2022-11-19 DIAGNOSIS — J410 Simple chronic bronchitis: Secondary | ICD-10-CM

## 2022-11-19 DIAGNOSIS — I70213 Atherosclerosis of native arteries of extremities with intermittent claudication, bilateral legs: Secondary | ICD-10-CM

## 2022-11-19 DIAGNOSIS — I10 Essential (primary) hypertension: Secondary | ICD-10-CM

## 2022-11-19 DIAGNOSIS — K219 Gastro-esophageal reflux disease without esophagitis: Secondary | ICD-10-CM | POA: Diagnosis not present

## 2022-11-19 MED ORDER — TRELEGY ELLIPTA 100-62.5-25 MCG/ACT IN AEPB
1.0000 | INHALATION_SPRAY | Freq: Every day | RESPIRATORY_TRACT | 2 refills | Status: DC
Start: 2022-11-19 — End: 2023-02-24

## 2022-11-19 MED ORDER — OMEPRAZOLE 20 MG PO CPDR
20.0000 mg | DELAYED_RELEASE_CAPSULE | Freq: Every day | ORAL | 0 refills | Status: DC
Start: 2022-11-19 — End: 2023-03-20

## 2022-11-19 MED ORDER — CELECOXIB 200 MG PO CAPS
200.0000 mg | ORAL_CAPSULE | Freq: Every day | ORAL | 1 refills | Status: DC
Start: 2022-11-19 — End: 2023-02-24

## 2022-11-19 NOTE — Progress Notes (Signed)
Established Patient Office Visit  Subjective:  Patient ID: William Ford, male    DOB: [birth year]  Age: 72 y.o. MRN: [id number]  Chief Complaint  Patient presents with   Follow-up    3 month follow up    No new complaints, here for medication refills. Chronic SOB and cough ISQ.  No other concerns at this time.   Past Medical History:  Diagnosis Date   Arthritis    hands   Colon cancer (HCC)    Emphysema of lung (HCC)    mild (per pt)   Hypertension    Numbness    toes and fingers (Melvinia Ashby/P chemo - per pt)    Past Surgical History:  Procedure Laterality Date   AMPUTATION Right 2003   4th    CATARACT EXTRACTION W/PHACO Right 02/02/2017   Procedure: CATARACT EXTRACTION PHACO AND INTRAOCULAR LENS PLACEMENT (IOC) RIGHT;  Surgeon: Lockie Mola, MD;  Location: [facility];  Service: Ophthalmology;  Laterality: Right;   CATARACT EXTRACTION W/PHACO Left 02/25/2017   Procedure: CATARACT EXTRACTION PHACO AND INTRAOCULAR LENS PLACEMENT (IOC) left;  Surgeon: Lockie Mola, MD;  Location: [facility];  Service: Ophthalmology;  Laterality: Left;   COLON SURGERY  2013   COLONOSCOPY     COLONOSCOPY WITH PROPOFOL N/A 07/16/2018   Procedure: COLONOSCOPY WITH PROPOFOL;  Surgeon: Christena Deem, MD;  Location: [facility];  Service: Endoscopy;  Laterality: N/A;   ESOPHAGOGASTRODUODENOSCOPY (EGD) WITH PROPOFOL N/A 07/16/2018   Procedure: ESOPHAGOGASTRODUODENOSCOPY (EGD) WITH PROPOFOL;  Surgeon: Christena Deem, MD;  Location: [facility];  Service: Endoscopy;  Laterality: N/A;    Social History   Socioeconomic History   Marital status: Divorced    Spouse name: Not on file   Number of children: Not on file   Years of education: Not on file   Highest education level: Not on file  Occupational History   Not on file  Tobacco Use   Smoking status: Every Day    Current packs/day: 0.50    Average packs/day: 0.5 packs/day for 45.0 years (22.5 ttl  pk-yrs)    Types: Cigarettes   Smokeless tobacco: Never   Tobacco comments:    since age 80  Vaping Use   Vaping status: Never Used  Substance and Sexual Activity   Alcohol use: Yes    Alcohol/week: 2.0 standard drinks of alcohol    Types: 2 Cans of beer per week   Drug use: No   Sexual activity: Not on file  Other Topics Concern   Not on file  Social History Narrative   Not on file   Social Determinants of Health   Financial Resource Strain: Not on file  Food Insecurity: Not on file  Transportation Needs: Not on file  Physical Activity: Not on file  Stress: Not on file  Social Connections: Not on file  Intimate Partner Violence: Not on file    Family History  Problem Relation Age of Onset   Cancer Maternal Grandfather     No Known Allergies  Review of Systems  Constitutional: Negative.   HENT: Negative.    Eyes: Negative.   Respiratory:  Positive for shortness of breath.   Cardiovascular: Negative.   Gastrointestinal: Negative.   Genitourinary: Negative.   Skin: Negative.   Neurological: Negative.   Endo/Heme/Allergies: Negative.        Objective:   BP 128/82   Pulse 66   Ht 6\' 1"  (1.854 m)   Wt 136 lb 12.8 oz (62.1 kg)  SpO2 96%   BMI 18.05 kg/m   Vitals:   11/19/22 0954  BP: 128/82  Pulse: 66  Height: 6\' 1"  (1.854 m)  Weight: 136 lb 12.8 oz (62.1 kg)  SpO2: 96%  BMI (Calculated): 18.05    Physical Exam Vitals reviewed.  Constitutional:      Appearance: Normal appearance.  HENT:     Head: Normocephalic.     Left Ear: There is no impacted cerumen.     Nose: Nose normal.     Mouth/Throat:     Mouth: Mucous membranes are moist.     Pharynx: No posterior oropharyngeal erythema.  Eyes:     Extraocular Movements: Extraocular movements intact.     Pupils: Pupils are equal, round, and reactive to light.  Cardiovascular:     Rate and Rhythm: Regular rhythm.     Chest Wall: PMI is not displaced.     Pulses: Normal pulses.     Heart  sounds: Normal heart sounds. No murmur heard. Pulmonary:     Effort: Pulmonary effort is normal.     Breath sounds: Normal air entry. Examination of the right-lower field reveals rales. Examination of the left-lower field reveals rales. Rales present. No rhonchi.     Comments: coarse Abdominal:     General: Abdomen is flat. Bowel sounds are normal. There is no distension.     Palpations: Abdomen is soft. There is no hepatomegaly, splenomegaly or mass.     Tenderness: There is no abdominal tenderness.  Musculoskeletal:        General: Normal range of motion.     Cervical back: Normal range of motion and neck supple.     Right lower leg: No edema.     Left lower leg: No edema.  Skin:    General: Skin is warm and dry.  Neurological:     General: No focal deficit present.     Mental Status: He is alert and oriented to person, place, and time.     Cranial Nerves: No cranial nerve deficit.     Motor: No weakness.  Psychiatric:        Mood and Affect: Mood normal.        Behavior: Behavior normal.      No results found for any visits on 11/19/22.  No results found for this or any previous visit (from the past 2160 hour(Syesha Thaw)).    Assessment & Plan:  As per problem list.  Problem List Items Addressed This Visit       Cardiovascular and Mediastinum   Primary hypertension   Relevant Medications   sildenafil (VIAGRA) 100 MG tablet   Other Relevant Orders   CBC With Diff/Platelet   Comprehensive metabolic panel   Atherosclerosis of native arteries of extremity with intermittent claudication (HCC)   Relevant Medications   sildenafil (VIAGRA) 100 MG tablet   celecoxib (CELEBREX) 200 MG capsule   Other Relevant Orders   Lipid panel     Respiratory   Simple chronic bronchitis (HCC) - Primary   Relevant Medications   Fluticasone-Umeclidin-Vilant (TRELEGY ELLIPTA) 100-62.5-25 MCG/ACT AEPB     Digestive   Gastroesophageal reflux disease   Relevant Medications   omeprazole  (PRILOSEC) 20 MG capsule     Other   Tobacco use disorder   Arthralgia   Relevant Medications   celecoxib (CELEBREX) 200 MG capsule    Return in about 4 months (around 03/21/2023) for awv with labs prior.   Total time spent: 20 minutes  Marland Mcalpine  Gillermo Murdoch, MD  11/19/2022   This document may have been prepared by [facility] Voice Recognition software and as such may include unintentional dictation errors.

## 2023-01-16 ENCOUNTER — Other Ambulatory Visit: Payer: Self-pay | Admitting: Family

## 2023-02-04 ENCOUNTER — Other Ambulatory Visit: Payer: Self-pay | Admitting: Internal Medicine

## 2023-02-06 ENCOUNTER — Other Ambulatory Visit: Payer: Self-pay | Admitting: Internal Medicine

## 2023-02-18 ENCOUNTER — Encounter: Payer: Self-pay | Admitting: Nurse Practitioner

## 2023-02-24 ENCOUNTER — Other Ambulatory Visit: Payer: Self-pay | Admitting: Internal Medicine

## 2023-02-24 DIAGNOSIS — J410 Simple chronic bronchitis: Secondary | ICD-10-CM

## 2023-02-24 DIAGNOSIS — M2550 Pain in unspecified joint: Secondary | ICD-10-CM

## 2023-03-11 ENCOUNTER — Other Ambulatory Visit: Payer: Medicare HMO

## 2023-03-11 ENCOUNTER — Encounter: Payer: Self-pay | Admitting: Nurse Practitioner

## 2023-03-11 DIAGNOSIS — I10 Essential (primary) hypertension: Secondary | ICD-10-CM

## 2023-03-11 DIAGNOSIS — I70213 Atherosclerosis of native arteries of extremities with intermittent claudication, bilateral legs: Secondary | ICD-10-CM

## 2023-03-12 LAB — CBC WITH DIFF/PLATELET
Basophils Absolute: 0 10*3/uL (ref 0.0–0.2)
Basos: 1 %
EOS (ABSOLUTE): 0 10*3/uL (ref 0.0–0.4)
Eos: 1 %
Hematocrit: 52.7 % — ABNORMAL HIGH (ref 37.5–51.0)
Hemoglobin: 17.4 g/dL (ref 13.0–17.7)
Immature Grans (Abs): 0 10*3/uL (ref 0.0–0.1)
Immature Granulocytes: 0 %
Lymphocytes Absolute: 1.8 10*3/uL (ref 0.7–3.1)
Lymphs: 36 %
MCH: 30.6 pg (ref 26.6–33.0)
MCHC: 33 g/dL (ref 31.5–35.7)
MCV: 93 fL (ref 79–97)
Monocytes Absolute: 0.5 10*3/uL (ref 0.1–0.9)
Monocytes: 9 %
Neutrophils Absolute: 2.7 10*3/uL (ref 1.4–7.0)
Neutrophils: 53 %
Platelets: 165 10*3/uL (ref 150–450)
RBC: 5.68 x10E6/uL (ref 4.14–5.80)
RDW: 13.7 % (ref 11.6–15.4)
WBC: 5.1 10*3/uL (ref 3.4–10.8)

## 2023-03-12 LAB — COMPREHENSIVE METABOLIC PANEL
ALT: 10 [IU]/L (ref 0–44)
AST: 23 [IU]/L (ref 0–40)
Albumin: 4.6 g/dL (ref 3.8–4.8)
Alkaline Phosphatase: 97 [IU]/L (ref 44–121)
BUN/Creatinine Ratio: 7 — ABNORMAL LOW (ref 10–24)
BUN: 9 mg/dL (ref 8–27)
Bilirubin Total: 0.8 mg/dL (ref 0.0–1.2)
CO2: 25 mmol/L (ref 20–29)
Calcium: 10 mg/dL (ref 8.6–10.2)
Chloride: 100 mmol/L (ref 96–106)
Creatinine, Ser: 1.21 mg/dL (ref 0.76–1.27)
Globulin, Total: 3.3 g/dL (ref 1.5–4.5)
Glucose: 101 mg/dL — ABNORMAL HIGH (ref 70–99)
Potassium: 5.2 mmol/L (ref 3.5–5.2)
Sodium: 138 mmol/L (ref 134–144)
Total Protein: 7.9 g/dL (ref 6.0–8.5)
eGFR: 64 mL/min/{1.73_m2} (ref >59)

## 2023-03-12 LAB — LIPID PANEL
Chol/HDL Ratio: 2.8 {ratio} (ref 0.0–5.0)
Cholesterol, Total: 178 mg/dL (ref 100–199)
HDL: 64 mg/dL (ref >39)
LDL Chol Calc (NIH): 96 mg/dL (ref 0–99)
Triglycerides: 99 mg/dL (ref 0–149)
VLDL Cholesterol Cal: 18 mg/dL (ref 5–40)

## 2023-03-20 ENCOUNTER — Encounter: Payer: Self-pay | Admitting: Nurse Practitioner

## 2023-03-20 ENCOUNTER — Encounter: Payer: Self-pay | Admitting: Internal Medicine

## 2023-03-20 ENCOUNTER — Ambulatory Visit (INDEPENDENT_AMBULATORY_CARE_PROVIDER_SITE_OTHER): Payer: Medicare HMO | Admitting: Internal Medicine

## 2023-03-20 VITALS — BP 116/74 | HR 105 | Ht 73.0 in | Wt 139.6 lb

## 2023-03-20 DIAGNOSIS — Z1331 Encounter for screening for depression: Secondary | ICD-10-CM | POA: Diagnosis not present

## 2023-03-20 DIAGNOSIS — I10 Essential (primary) hypertension: Secondary | ICD-10-CM | POA: Diagnosis not present

## 2023-03-20 DIAGNOSIS — M2550 Pain in unspecified joint: Secondary | ICD-10-CM

## 2023-03-20 DIAGNOSIS — K219 Gastro-esophageal reflux disease without esophagitis: Secondary | ICD-10-CM

## 2023-03-20 DIAGNOSIS — Z0001 Encounter for general adult medical examination with abnormal findings: Secondary | ICD-10-CM | POA: Diagnosis not present

## 2023-03-20 DIAGNOSIS — J3089 Other allergic rhinitis: Secondary | ICD-10-CM

## 2023-03-20 DIAGNOSIS — D751 Secondary polycythemia: Secondary | ICD-10-CM | POA: Diagnosis not present

## 2023-03-20 DIAGNOSIS — F17200 Nicotine dependence, unspecified, uncomplicated: Secondary | ICD-10-CM

## 2023-03-20 MED ORDER — FLUTICASONE PROPIONATE 50 MCG/ACT NA SUSP: 1.0000 | Freq: Two times a day (BID) | NASAL | 0 refills | Status: DC

## 2023-03-20 MED ORDER — OMEPRAZOLE 20 MG PO CPDR: 20.0000 mg | DELAYED_RELEASE_CAPSULE | Freq: Every day | ORAL | 0 refills | Status: DC

## 2023-03-20 MED ORDER — GABAPENTIN 300 MG PO CAPS: 300.0000 mg | ORAL_CAPSULE | Freq: Three times a day (TID) | ORAL | 1 refills | Status: AC

## 2023-03-20 MED ORDER — LISINOPRIL 20 MG PO TABS: 20.0000 mg | ORAL_TABLET | Freq: Every morning | ORAL | 1 refills | Status: DC

## 2023-03-20 MED ORDER — CELECOXIB 200 MG PO CAPS: 200.0000 mg | ORAL_CAPSULE | Freq: Every day | ORAL | 2 refills | Status: DC

## 2023-03-20 NOTE — Progress Notes (Signed)
Established Patient Office Visit  Subjective:  Patient ID: William Ford, male    DOB: June 21, 1950  Age: 72 y.o. MRN: [id number]  Chief Complaint  Patient presents with   Annual Exam    AWV, 4 month follow up, discuss lab results.    No new complaints, here for AWV refer to quality metrics and scanned documents.   Labs reviewed and notable for polycythemia, elevated fasting glucose in the prediabetic range. LDL and TC well controlled on lab review. Triglycerides also satisfactory. Still smokes and denies any difficulty sleeping but Ford/o daytime somnolence.  No other concerns at this time.   Past Medical History:  Diagnosis Date   Arthritis    hands   Colon cancer (HCC)    Emphysema of lung (HCC)    mild (per pt)   Hypertension    Numbness    toes and fingers (William Ford/P chemo - per pt)    Past Surgical History:  Procedure Laterality Date   AMPUTATION Right 2003   4th    CATARACT EXTRACTION W/PHACO Right 02/02/2017   Procedure: CATARACT EXTRACTION PHACO AND INTRAOCULAR LENS PLACEMENT (IOC) RIGHT;  Surgeon: Lockie Mola, MD;  Location: [facility];  Service: Ophthalmology;  Laterality: Right;   CATARACT EXTRACTION W/PHACO Left 02/25/2017   Procedure: CATARACT EXTRACTION PHACO AND INTRAOCULAR LENS PLACEMENT (IOC) left;  Surgeon: Lockie Mola, MD;  Location: [facility];  Service: Ophthalmology;  Laterality: Left;   COLON SURGERY  2013   COLONOSCOPY     COLONOSCOPY WITH PROPOFOL N/A 07/16/2018   Procedure: COLONOSCOPY WITH PROPOFOL;  Surgeon: Christena Deem, MD;  Location: [facility] END[facility];  Service: Endoscopy;  Laterality: N/A;   ESOPHAGOGASTRODUODENOSCOPY (EGD) WITH PROPOFOL N/A 07/16/2018   Procedure: ESOPHAGOGASTRODUODENOSCOPY (EGD) WITH PROPOFOL;  Surgeon: Christena Deem, MD;  Location: [facility] ENDOSCOPY;  Service: Endoscopy;  Laterality: N/A;    Social History   Socioeconomic History   Marital status: Divorced    Spouse name: Not on file    Number of children: Not on file   Years of education: Not on file   Highest education level: Not on file  Occupational History   Not on file  Tobacco Use   Smoking status: Every Day    Current packs/day: 0.50    Average packs/day: 0.5 packs/day for 45.0 years (22.5 ttl pk-yrs)    Types: Cigarettes   Smokeless tobacco: Never   Tobacco comments:    since age 20  Vaping Use   Vaping status: Never Used  Substance and Sexual Activity   Alcohol use: Yes    Alcohol/week: 2.0 standard drinks of alcohol    Types: 2 Cans of beer per week   Drug use: No   Sexual activity: Not on file  Other Topics Concern   Not on file  Social History Narrative   Not on file   Social Drivers of Health   Financial Resource Strain: Not on file  Food Insecurity: Not on file  Transportation Needs: Not on file  Physical Activity: Not on file  Stress: Not on file  Social Connections: Not on file  Intimate Partner Violence: Not on file    Family History  Problem Relation Age of Onset   Cancer Maternal Grandfather     No Known Allergies  Outpatient Medications Prior to Visit  Medication Sig   cetirizine (ZYRTEC) 10 MG tablet TAKE ONE TABLET EVERY MORNING   Fluticasone-Umeclidin-Vilant (TRELEGY ELLIPTA) 100-62.5-25 MCG/ACT AEPB INHALE 1 PUFF INTO LUNGS EVERY DAY   sildenafil (  VIAGRA) 100 MG tablet Take 100 mg by mouth daily as needed for erectile dysfunction.   [DISCONTINUED] celecoxib (CELEBREX) 200 MG capsule TAKE 1 CAPSULE BY MOUTH EVERY DAY   [DISCONTINUED] fluticasone (FLONASE) 50 MCG/ACT nasal spray Place 1 spray into both nostrils 2 (two) times daily.   [DISCONTINUED] gabapentin (NEURONTIN) 300 MG capsule TAKE 1 CAPSULE BY MOUTH THREE TIMES DAILY   [DISCONTINUED] lisinopril (ZESTRIL) 20 MG tablet Take 1 tablet (20 mg total) by mouth every morning.   [DISCONTINUED] omeprazole (PRILOSEC) 20 MG capsule Take 1 capsule (20 mg total) by mouth daily.   SPS 15 GM/60ML suspension SMARTSIG:120  Milliliter(William Ford) By Mouth Once (Patient not taking: Reported on 03/20/2023)   Facility-Administered Medications Prior to Visit  Medication Dose Route Frequency Provider   heparin lock flush 100 unit/mL  500 Units Intravenous Once Corcoran, Melissa C, MD   sodium chloride 0.9 % injection 10 mL  10 mL Intravenous PRN Corcoran, Melissa C, MD   sodium chloride flush (NS) 0.9 % injection 10 mL  10 mL Intravenous PRN Corcoran, Melissa C, MD   sodium chloride flush (NS) 0.9 % injection 10 mL  10 mL Intravenous PRN Corcoran, Melissa C, MD   sodium chloride flush (NS) 0.9 % injection 10 mL  10 mL Intravenous PRN William Bath, MD    Review of Systems  Constitutional: Negative.  Negative for weight loss (gained 3 lbs).  HENT: Negative.    Eyes: Negative.   Respiratory:  Positive for shortness of breath (improved with mdi).   Cardiovascular: Negative.   Gastrointestinal: Negative.   Genitourinary: Negative.   Skin: Negative.   Neurological: Negative.   Endo/Heme/Allergies: Negative.        Objective:   BP 116/74   Pulse (!) 105   Ht 6\' 1"  (1.854 m)   Wt 139 lb 9.6 oz (63.3 kg)   SpO2 93%   BMI 18.42 kg/m   Vitals:   03/20/23 0935  BP: 116/74  Pulse: (!) 105  Height: 6\' 1"  (1.854 m)  Weight: 139 lb 9.6 oz (63.3 kg)  SpO2: 93%  BMI (Calculated): 18.42    Physical Exam Vitals reviewed.  Constitutional:      Appearance: Normal appearance.  HENT:     Head: Normocephalic.     Left Ear: There is no impacted cerumen.     Nose: Nose normal.     Mouth/Throat:     Mouth: Mucous membranes are moist.     Pharynx: No posterior oropharyngeal erythema.  Eyes:     Extraocular Movements: Extraocular movements intact.     Pupils: Pupils are equal, round, and reactive to light.  Cardiovascular:     Rate and Rhythm: Regular rhythm.     Chest Wall: PMI is not displaced.     Pulses: Normal pulses.     Heart sounds: Normal heart sounds. No murmur heard. Pulmonary:     Effort:  Pulmonary effort is normal.     Breath sounds: Normal air entry. Examination of the right-lower field reveals rales. Examination of the left-lower field reveals rales. Rales present. No rhonchi.     Comments: coarse Abdominal:     General: Abdomen is flat. Bowel sounds are normal. There is no distension.     Palpations: Abdomen is soft. There is no hepatomegaly, splenomegaly or mass.     Tenderness: There is no abdominal tenderness.  Musculoskeletal:        General: Normal range of motion.     Cervical back: Normal  range of motion and neck supple.     Right lower leg: No edema.     Left lower leg: No edema.  Skin:    General: Skin is warm and dry.  Neurological:     General: No focal deficit present.     Mental Status: He is alert and oriented to person, place, and time.     Cranial Nerves: No cranial nerve deficit.     Motor: No weakness.  Psychiatric:        Mood and Affect: Mood normal.        Behavior: Behavior normal.      No results found for any visits on 03/20/23.  Recent Results (from the past 2160 hours)  Lipid panel     Status: None   Collection Time: 03/11/23  9:08 AM  Result Value Ref Range   Cholesterol, Total 178 100 - 199 mg/dL   Triglycerides 99 0 - 149 mg/dL   HDL 64 >16 mg/dL   VLDL Cholesterol Cal 18 5 - 40 mg/dL   LDL Chol Calc (NIH) 96 0 - 99 mg/dL   Chol/HDL Ratio 2.8 0.0 - 5.0 ratio    Comment:                                   T. Chol/HDL Ratio                                             Men  Women                               1/2 Avg.Risk  3.4    3.3                                   Avg.Risk  5.0    4.4                                2X Avg.Risk  9.6    7.1                                3X Avg.Risk 23.4   11.0   Comprehensive metabolic panel     Status: Abnormal   Collection Time: 03/11/23  9:08 AM  Result Value Ref Range   Glucose 101 (H) 70 - 99 mg/dL   BUN 9 8 - 27 mg/dL   Creatinine, Ser 1.09 0.76 - 1.27 mg/dL   eGFR 64 >60  AV/WUJ/8.11   BUN/Creatinine Ratio 7 (L) 10 - 24   Sodium 138 134 - 144 mmol/L   Potassium 5.2 3.5 - 5.2 mmol/L   Chloride 100 96 - 106 mmol/L   CO2 25 20 - 29 mmol/L   Calcium 10.0 8.6 - 10.2 mg/dL   Total Protein 7.9 6.0 - 8.5 g/dL   Albumin 4.6 3.8 - 4.8 g/dL   Globulin, Total 3.3 1.5 - 4.5 g/dL   Bilirubin Total 0.8 0.0 - 1.2 mg/dL   Alkaline Phosphatase 97 44 - 121 IU/L   AST 23 0 - 40 IU/L  ALT 10 0 - 44 IU/L  CBC With Diff/Platelet     Status: Abnormal   Collection Time: 03/11/23  9:08 AM  Result Value Ref Range   WBC 5.1 3.4 - 10.8 x10E3/uL   RBC 5.68 4.14 - 5.80 x10E6/uL   Hemoglobin 17.4 13.0 - 17.7 g/dL   Hematocrit 56.2 (H) 13.0 - 51.0 %   MCV 93 79 - 97 fL   MCH 30.6 26.6 - 33.0 pg   MCHC 33.0 31.5 - 35.7 g/dL   RDW 86.5 78.4 - 69.6 %   Platelets 165 150 - 450 x10E3/uL    Comment: Platelets appear clumped.   Neutrophils 53 Not Estab. %   Lymphs 36 Not Estab. %   Monocytes 9 Not Estab. %   Eos 1 Not Estab. %   Basos 1 Not Estab. %   Neutrophils Absolute 2.7 1.4 - 7.0 x10E3/uL   Lymphocytes Absolute 1.8 0.7 - 3.1 x10E3/uL   Monocytes Absolute 0.5 0.1 - 0.9 x10E3/uL   EOS (ABSOLUTE) 0.0 0.0 - 0.4 x10E3/uL   Basophils Absolute 0.0 0.0 - 0.2 x10E3/uL   Immature Granulocytes 0 Not Estab. %   Immature Grans (Abs) 0.0 0.0 - 0.1 x10E3/uL   Hematology Comments: Note:     Comment: Verified by microscopic examination.      Assessment & Plan:  As per problem list. Problem List Items Addressed This Visit       Cardiovascular and Mediastinum   Primary hypertension   Relevant Medications   lisinopril (ZESTRIL) 20 MG tablet   Other Relevant Orders   Lipid panel   Comprehensive metabolic panel     Digestive   Gastroesophageal reflux disease   Relevant Medications   omeprazole (PRILOSEC) 20 MG capsule     Other   Polycythemia, secondary - Primary   Relevant Orders   Ambulatory referral to Sleep Studies   CBC With Diff/Platelet   Tobacco use disorder    Relevant Orders   CT CHEST LUNG CA SCREEN LOW DOSE W/O CM   Arthralgia   Relevant Medications   celecoxib (CELEBREX) 200 MG capsule   Other Visit Diagnoses       Non-seasonal allergic rhinitis, unspecified trigger       Relevant Medications   fluticasone (FLONASE) 50 MCG/ACT nasal spray       Return in 3 months (on 06/18/2023) for fu with labs prior.   Total time spent: 30 minutes  Luna Fuse, MD  03/20/2023   This document may have been prepared by [facility] Voice Recognition software and as such may include unintentional dictation errors.

## 2023-04-07 ENCOUNTER — Encounter: Payer: Self-pay | Admitting: Nurse Practitioner

## 2023-04-14 ENCOUNTER — Other Ambulatory Visit: Payer: Medicaid Other

## 2023-04-14 ENCOUNTER — Encounter: Payer: Self-pay | Admitting: Nurse Practitioner

## 2023-04-28 ENCOUNTER — Encounter: Payer: Self-pay | Admitting: Nurse Practitioner

## 2023-04-28 ENCOUNTER — Ambulatory Visit: Payer: Medicare HMO

## 2023-04-28 DIAGNOSIS — F17219 Nicotine dependence, cigarettes, with unspecified nicotine-induced disorders: Secondary | ICD-10-CM | POA: Diagnosis not present

## 2023-04-28 DIAGNOSIS — F17200 Nicotine dependence, unspecified, uncomplicated: Secondary | ICD-10-CM

## 2023-05-12 NOTE — Progress Notes (Signed)
Patient informed will call back for appt with cards

## 2023-06-17 ENCOUNTER — Encounter: Payer: Self-pay | Admitting: Nurse Practitioner

## 2023-06-19 ENCOUNTER — Ambulatory Visit: Payer: Medicare HMO | Admitting: Internal Medicine

## 2023-06-25 ENCOUNTER — Other Ambulatory Visit: Payer: Self-pay | Admitting: Internal Medicine

## 2023-06-25 DIAGNOSIS — J410 Simple chronic bronchitis: Secondary | ICD-10-CM

## 2023-06-25 DIAGNOSIS — K219 Gastro-esophageal reflux disease without esophagitis: Secondary | ICD-10-CM

## 2023-07-07 ENCOUNTER — Encounter: Payer: Self-pay | Admitting: Nurse Practitioner

## 2023-07-14 ENCOUNTER — Encounter: Payer: Self-pay | Admitting: Nurse Practitioner

## 2023-07-14 ENCOUNTER — Ambulatory Visit (INDEPENDENT_AMBULATORY_CARE_PROVIDER_SITE_OTHER): Payer: Medicare HMO | Admitting: Internal Medicine

## 2023-07-14 VITALS — BP 124/76 | HR 88 | Temp 96.9°F | Ht 73.0 in | Wt 136.0 lb

## 2023-07-14 DIAGNOSIS — J3089 Other allergic rhinitis: Secondary | ICD-10-CM | POA: Diagnosis not present

## 2023-07-14 DIAGNOSIS — J410 Simple chronic bronchitis: Secondary | ICD-10-CM

## 2023-07-14 DIAGNOSIS — F17200 Nicotine dependence, unspecified, uncomplicated: Secondary | ICD-10-CM

## 2023-07-14 DIAGNOSIS — M2550 Pain in unspecified joint: Secondary | ICD-10-CM | POA: Diagnosis not present

## 2023-07-14 DIAGNOSIS — I10 Essential (primary) hypertension: Secondary | ICD-10-CM | POA: Diagnosis not present

## 2023-07-14 DIAGNOSIS — D751 Secondary polycythemia: Secondary | ICD-10-CM

## 2023-07-14 DIAGNOSIS — K219 Gastro-esophageal reflux disease without esophagitis: Secondary | ICD-10-CM

## 2023-07-14 MED ORDER — OMEPRAZOLE 20 MG PO CPDR: 20.0000 mg | DELAYED_RELEASE_CAPSULE | Freq: Every day | ORAL | 2 refills | Status: DC

## 2023-07-14 MED ORDER — CETIRIZINE HCL 10 MG PO TABS
10.0000 mg | ORAL_TABLET | Freq: Every morning | ORAL | 0 refills | Status: DC
Start: 2023-07-14 — End: 2023-10-23

## 2023-07-14 MED ORDER — CELECOXIB 200 MG PO CAPS: 200.0000 mg | ORAL_CAPSULE | Freq: Every day | ORAL | 2 refills | Status: DC

## 2023-07-14 MED ORDER — FLUTICASONE PROPIONATE 50 MCG/ACT NA SUSP: 1.0000 | Freq: Two times a day (BID) | NASAL | 0 refills | Status: AC

## 2023-07-14 MED ORDER — TRELEGY ELLIPTA 100-62.5-25 MCG/ACT IN AEPB: 1.0000 | INHALATION_SPRAY | Freq: Every day | RESPIRATORY_TRACT | 2 refills | Status: AC

## 2023-07-14 NOTE — Progress Notes (Signed)
 Established Patient Office Visit  Subjective:  Patient ID: William Ford, male    DOB: 1950/04/08  Age: 73 y.o. MRN: [id number]  Chief Complaint  Patient presents with   Follow-up    Follow Up Lab Results    No new complaints, here for lab review and medication refills.     No other concerns at this time.   Past Medical History:  Diagnosis Date   Arthritis    hands   Colon cancer (HCC)    Emphysema of lung (HCC)    mild (per pt)   Hypertension    Numbness    toes and fingers (Maykayla Highley/P chemo - per pt)    Past Surgical History:  Procedure Laterality Date   AMPUTATION Right 2003   4th    CATARACT EXTRACTION W/PHACO Right 02/02/2017   Procedure: CATARACT EXTRACTION PHACO AND INTRAOCULAR LENS PLACEMENT (IOC) RIGHT;  Surgeon: Lockie Mola, MD;  Location: [facility];  Service: Ophthalmology;  Laterality: Right;   CATARACT EXTRACTION W/PHACO Left 02/25/2017   Procedure: CATARACT EXTRACTION PHACO AND INTRAOCULAR LENS PLACEMENT (IOC) left;  Surgeon: Lockie Mola, MD;  Location: [facility];  Service: Ophthalmology;  Laterality: Left;   COLON SURGERY  2013   COLONOSCOPY     COLONOSCOPY WITH PROPOFOL N/A 07/16/2018   Procedure: COLONOSCOPY WITH PROPOFOL;  Surgeon: Christena Deem, MD;  Location: [facility] ENDOSCOPY;  Service: Endoscopy;  Laterality: N/A;   ESOPHAGOGASTRODUODENOSCOPY (EGD) WITH PROPOFOL N/A 07/16/2018   Procedure: ESOPHAGOGASTRODUODENOSCOPY (EGD) WITH PROPOFOL;  Surgeon: Christena Deem, MD;  Location: [facility] ENDOSCOPY;  Service: Endoscopy;  Laterality: N/A;    Social History   Socioeconomic History   Marital status: Divorced    Spouse name: Not on file   Number of children: Not on file   Years of education: Not on file   Highest education level: Not on file  Occupational History   Not on file  Tobacco Use   Smoking status: Every Day    Current packs/day: 0.50    Average packs/day: 0.5 packs/day for 45.0 years (22.5 ttl pk-yrs)     Types: Cigarettes   Smokeless tobacco: Never   Tobacco comments:    since age 38  Vaping Use   Vaping status: Never Used  Substance and Sexual Activity   Alcohol use: Yes    Alcohol/week: 2.0 standard drinks of alcohol    Types: 2 Cans of beer per week   Drug use: No   Sexual activity: Not on file  Other Topics Concern   Not on file  Social History Narrative   Not on file   Social Drivers of Health   Financial Resource Strain: Not on file  Food Insecurity: Not on file  Transportation Needs: Not on file  Physical Activity: Not on file  Stress: Not on file  Social Connections: Not on file  Intimate Partner Violence: Not on file    Family History  Problem Relation Age of Onset   Cancer Maternal Grandfather     No Known Allergies  Outpatient Medications Prior to Visit  Medication Sig   gabapentin (NEURONTIN) 300 MG capsule Take 1 capsule (300 mg total) by mouth 3 (three) times daily.   lisinopril (ZESTRIL) 20 MG tablet Take 1 tablet (20 mg total) by mouth every morning.   sildenafil (VIAGRA) 100 MG tablet Take 100 mg by mouth daily as needed for erectile dysfunction.   [DISCONTINUED] celecoxib (CELEBREX) 200 MG capsule Take 1 capsule (200 mg total) by mouth daily.   [  DISCONTINUED] cetirizine (ZYRTEC) 10 MG tablet TAKE ONE TABLET EVERY MORNING   [DISCONTINUED] Fluticasone-Umeclidin-Vilant (TRELEGY ELLIPTA) 100-62.5-25 MCG/ACT AEPB INHALE 1 PUFF INTO LUNGS ONCE DAILY   [DISCONTINUED] omeprazole (PRILOSEC) 20 MG capsule TAKE 1 CAPSULE BY MOUTH DAILY.   [DISCONTINUED] fluticasone (FLONASE) 50 MCG/ACT nasal spray Place 1 spray into both nostrils 2 (two) times daily.   [DISCONTINUED] SPS 15 GM/60ML suspension SMARTSIG:120 Milliliter(Hulon Ferron) By Mouth Once (Patient not taking: Reported on 10/03/2020)   Facility-Administered Medications Prior to Visit  Medication Dose Route Frequency Provider   heparin lock flush 100 unit/mL  500 Units Intravenous Once Corcoran, Melissa C, MD    sodium chloride 0.9 % injection 10 mL  10 mL Intravenous PRN Corcoran, Melissa C, MD   sodium chloride flush (NS) 0.9 % injection 10 mL  10 mL Intravenous PRN Corcoran, Melissa C, MD   sodium chloride flush (NS) 0.9 % injection 10 mL  10 mL Intravenous PRN Corcoran, Melissa C, MD   sodium chloride flush (NS) 0.9 % injection 10 mL  10 mL Intravenous PRN Corcoran, Melissa C, MD    Review of Systems  Constitutional:  Positive for weight loss (3 lbs).  HENT: Negative.    Eyes: Negative.   Respiratory:  Positive for cough and shortness of breath (improved with mdi).   Cardiovascular: Negative.   Gastrointestinal: Negative.   Genitourinary: Negative.   Skin: Negative.   Neurological: Negative.   Endo/Heme/Allergies: Negative.        Objective:   BP 124/76   Pulse 88   Temp (!) 96.9 F (36.1 C) (Tympanic)   Ht 6\' 1"  (1.854 m)   Wt 136 lb (61.7 kg)   SpO2 94%   BMI 17.94 kg/m   Vitals:   07/14/23 1314  BP: 124/76  Pulse: 88  Temp: (!) 96.9 F (36.1 C)  Height: 6\' 1"  (1.854 m)  Weight: 136 lb (61.7 kg)  SpO2: 94%  TempSrc: Tympanic  BMI (Calculated): 17.95    Physical Exam Vitals reviewed.  Constitutional:      Appearance: Normal appearance.  HENT:     Head: Normocephalic.     Left Ear: There is no impacted cerumen.     Nose: Nose normal.     Mouth/Throat:     Mouth: Mucous membranes are moist.     Pharynx: No posterior oropharyngeal erythema.  Eyes:     Extraocular Movements: Extraocular movements intact.     Pupils: Pupils are equal, round, and reactive to light.  Cardiovascular:     Rate and Rhythm: Regular rhythm.     Chest Wall: PMI is not displaced.     Pulses: Normal pulses.     Heart sounds: Normal heart sounds. No murmur heard. Pulmonary:     Effort: Pulmonary effort is normal.     Breath sounds: Normal air entry. Examination of the right-lower field reveals rales. Examination of the left-lower field reveals rales. Rales present. No rhonchi.      Comments: coarse Abdominal:     General: Abdomen is flat. Bowel sounds are normal. There is no distension.     Palpations: Abdomen is soft. There is no hepatomegaly, splenomegaly or mass.     Tenderness: There is no abdominal tenderness.  Musculoskeletal:        General: Normal range of motion.     Cervical back: Normal range of motion and neck supple.     Right lower leg: No edema.     Left lower leg: No edema.  Skin:  General: Skin is warm and dry.  Neurological:     General: No focal deficit present.     Mental Status: He is alert and oriented to person, place, and time.     Cranial Nerves: No cranial nerve deficit.     Motor: No weakness.  Psychiatric:        Mood and Affect: Mood normal.        Behavior: Behavior normal.      No results found for any visits on 07/14/23.  No results found for this or any previous visit (from the past 2160 hours).    Assessment & Plan:  As per problem list. The current medical regimen is effective;  continue present plan and medications.   Problem List Items Addressed This Visit       Cardiovascular and Mediastinum   Primary hypertension     Respiratory   Simple chronic bronchitis (HCC)   Relevant Medications   Fluticasone-Umeclidin-Vilant (TRELEGY ELLIPTA) 100-62.5-25 MCG/ACT AEPB     Digestive   Gastroesophageal reflux disease   Relevant Medications   omeprazole (PRILOSEC) 20 MG capsule     Other   Polycythemia, secondary   Tobacco use disorder - Primary   Relevant Orders   CT CHEST LUNG CA SCREEN LOW DOSE W/O CM   Arthralgia   Relevant Medications   celecoxib (CELEBREX) 200 MG capsule   Other Visit Diagnoses       Non-seasonal allergic rhinitis, unspecified trigger       Relevant Medications   cetirizine (ZYRTEC) 10 MG tablet   fluticasone (FLONASE) 50 MCG/ACT nasal spray       Return in about 3 months (around 10/13/2023) for fu with labs prior.   Total time spent: 20 minutes  Arzella Bitters,  MD  07/14/2023   This document may have been prepared by [facility] Voice Recognition software and as such may include unintentional dictation errors.

## 2023-09-30 ENCOUNTER — Encounter: Payer: Self-pay | Admitting: Nurse Practitioner

## 2023-09-30 ENCOUNTER — Other Ambulatory Visit

## 2023-09-30 ENCOUNTER — Other Ambulatory Visit: Payer: Self-pay | Admitting: Internal Medicine

## 2023-10-01 LAB — CBC WITH DIFF/PLATELET
Basophils Absolute: 0.1 10*3/uL (ref 0.0–0.2)
Basos: 1 %
EOS (ABSOLUTE): 0 10*3/uL (ref 0.0–0.4)
Eos: 1 %
Hematocrit: 50.5 % (ref 37.5–51.0)
Hemoglobin: 17 g/dL (ref 13.0–17.7)
Immature Grans (Abs): 0 10*3/uL (ref 0.0–0.1)
Immature Granulocytes: 0 %
Lymphocytes Absolute: 2 10*3/uL (ref 0.7–3.1)
Lymphs: 31 %
MCH: 33.1 pg — ABNORMAL HIGH (ref 26.6–33.0)
MCHC: 33.7 g/dL (ref 31.5–35.7)
MCV: 98 fL — ABNORMAL HIGH (ref 79–97)
Monocytes Absolute: 0.6 10*3/uL (ref 0.1–0.9)
Monocytes: 10 %
Neutrophils Absolute: 3.7 10*3/uL (ref 1.4–7.0)
Neutrophils: 57 %
Platelets: 150 10*3/uL (ref 150–450)
RBC: 5.14 x10E6/uL (ref 4.14–5.80)
RDW: 13.4 % (ref 11.6–15.4)
WBC: 6.4 10*3/uL (ref 3.4–10.8)

## 2023-10-01 LAB — LIPID PANEL
Chol/HDL Ratio: 2.4 ratio (ref 0.0–5.0)
Cholesterol, Total: 164 mg/dL (ref 100–199)
HDL: 68 mg/dL (ref >39)
LDL Chol Calc (NIH): 81 mg/dL (ref 0–99)
Triglycerides: 83 mg/dL (ref 0–149)
VLDL Cholesterol Cal: 15 mg/dL (ref 5–40)

## 2023-10-01 LAB — COMPREHENSIVE METABOLIC PANEL WITH GFR
ALT: 13 IU/L (ref 0–44)
AST: 25 IU/L (ref 0–40)
Albumin: 4.5 g/dL (ref 3.8–4.8)
Alkaline Phosphatase: 92 IU/L (ref 44–121)
BUN/Creatinine Ratio: 8 — ABNORMAL LOW (ref 10–24)
BUN: 10 mg/dL (ref 8–27)
Bilirubin Total: 0.7 mg/dL (ref 0.0–1.2)
CO2: 21 mmol/L (ref 20–29)
Calcium: 10.3 mg/dL — ABNORMAL HIGH (ref 8.6–10.2)
Chloride: 98 mmol/L (ref 96–106)
Creatinine, Ser: 1.25 mg/dL (ref 0.76–1.27)
Globulin, Total: 3.2 g/dL (ref 1.5–4.5)
Glucose: 95 mg/dL (ref 70–99)
Potassium: 5.3 mmol/L — ABNORMAL HIGH (ref 3.5–5.2)
Sodium: 136 mmol/L (ref 134–144)
Total Protein: 7.7 g/dL (ref 6.0–8.5)
eGFR: 62 mL/min/{1.73_m2} (ref >59)

## 2023-10-13 ENCOUNTER — Encounter: Payer: Self-pay | Admitting: Nurse Practitioner

## 2023-10-13 ENCOUNTER — Encounter: Payer: Self-pay | Admitting: Internal Medicine

## 2023-10-13 ENCOUNTER — Ambulatory Visit: Payer: Self-pay | Admitting: Internal Medicine

## 2023-10-13 ENCOUNTER — Ambulatory Visit (INDEPENDENT_AMBULATORY_CARE_PROVIDER_SITE_OTHER): Admitting: Internal Medicine

## 2023-10-13 VITALS — BP 121/75 | HR 110 | Temp 97.4°F | Ht 73.0 in | Wt 134.0 lb

## 2023-10-13 DIAGNOSIS — I10 Essential (primary) hypertension: Secondary | ICD-10-CM | POA: Diagnosis not present

## 2023-10-13 DIAGNOSIS — J3089 Other allergic rhinitis: Secondary | ICD-10-CM

## 2023-10-13 DIAGNOSIS — E875 Hyperkalemia: Secondary | ICD-10-CM | POA: Diagnosis not present

## 2023-10-13 DIAGNOSIS — J410 Simple chronic bronchitis: Secondary | ICD-10-CM | POA: Diagnosis not present

## 2023-10-13 DIAGNOSIS — R252 Cramp and spasm: Secondary | ICD-10-CM

## 2023-10-13 DIAGNOSIS — M2550 Pain in unspecified joint: Secondary | ICD-10-CM

## 2023-10-13 DIAGNOSIS — K219 Gastro-esophageal reflux disease without esophagitis: Secondary | ICD-10-CM

## 2023-10-13 MED ORDER — MAGNESIUM OXIDE -MG SUPPLEMENT 400 (240 MG) MG PO TABS: 400.0000 mg | ORAL_TABLET | Freq: Every day | ORAL | 2 refills | Status: AC

## 2023-10-13 MED ORDER — TRELEGY ELLIPTA 100-62.5-25 MCG/ACT IN AEPB: 1.0000 | INHALATION_SPRAY | Freq: Every day | RESPIRATORY_TRACT | 2 refills | Status: DC

## 2023-10-13 MED ORDER — CELECOXIB 200 MG PO CAPS: 200.0000 mg | ORAL_CAPSULE | Freq: Every day | ORAL | 2 refills | Status: DC

## 2023-10-13 MED ORDER — LOKELMA 10 G PO PACK
10.0000 g | PACK | Freq: Three times a day (TID) | ORAL | 0 refills | Status: AC
Start: 2023-10-13 — End: 2023-10-14

## 2023-10-13 MED ORDER — OMEPRAZOLE 20 MG PO CPDR: 20.0000 mg | DELAYED_RELEASE_CAPSULE | Freq: Every day | ORAL | 2 refills | Status: DC

## 2023-10-13 NOTE — Progress Notes (Signed)
 Established Patient Office Visit  Subjective:  Patient ID: William Ford, male    DOB: 1951/01/02  Age: 73 y.o. MRN: [id number]  Chief Complaint  Patient presents with   Follow-up    Lab results    No new complaints, here for lab review and medication refills. 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 Labs reviewed and notable for hyperkalemia.    No other concerns at this time.   Past Medical History:  Diagnosis Date   Arthritis    hands   Colon cancer (HCC)    Emphysema of lung (HCC)    mild (per pt)   Hypertension    Numbness    toes and fingers (Bettye Sitton/P chemo - per pt)    Past Surgical History:  Procedure Laterality Date   AMPUTATION Right 2003   4th    CATARACT EXTRACTION W/PHACO Right 02/02/2017   Procedure: CATARACT EXTRACTION PHACO AND INTRAOCULAR LENS PLACEMENT (IOC) RIGHT;  Surgeon: Mittie Gaskin, MD;  Location: [facility];  Service: Ophthalmology;  Laterality: Right;   CATARACT EXTRACTION W/PHACO Left 02/25/2017   Procedure: CATARACT EXTRACTION PHACO AND INTRAOCULAR LENS PLACEMENT (IOC) left;  Surgeon: Mittie Gaskin, MD;  Location: [facility];  Service: Ophthalmology;  Laterality: Left;   COLON SURGERY  2013   COLONOSCOPY     COLONOSCOPY WITH PROPOFOL  N/A 07/16/2018   Procedure: COLONOSCOPY WITH PROPOFOL ;  Surgeon: Gaylyn Gladis PENNER, MD;  Location: [facility] ENDOSCOPY;  Service: Endoscopy;  Laterality: N/A;   ESOPHAGOGASTRODUODENOSCOPY (EGD) WITH PROPOFOL  N/A 07/16/2018   Procedure: ESOPHAGOGASTRODUODENOSCOPY (EGD) WITH PROPOFOL ;  Surgeon: Gaylyn Gladis PENNER, MD;  Location: [facility] ENDOSCOPY;  Service: Endoscopy;  Laterality: N/A;    Social History   Socioeconomic History   Marital status: Divorced    Spouse name: Not on file   Number of children: Not on file   Years of education: Not on file   Highest education level: Not on file  Occupational History   Not on file   Tobacco Use   Smoking status: Every Day    Current packs/day: 0.50    Average packs/day: 0.5 packs/day for 45.0 years (22.5 ttl pk-yrs)    Types: Cigarettes   Smokeless tobacco: Never   Tobacco comments:    since age 42  Vaping Use   Vaping status: Never Used  Substance and Sexual Activity   Alcohol use: Yes    Alcohol/week: 2.0 standard drinks of alcohol    Types: 2 Cans of beer per week   Drug use: No   Sexual activity: Not on file  Other Topics Concern   Not on file  Social History Narrative   Not on file   Social Drivers of Health   Financial Resource Strain: Not on file  Food Insecurity: Not on file  Transportation Needs: Not on file  Physical Activity: Not on file  Stress: Not on file  Social Connections: Not on file  Intimate Partner Violence: Not on file    Family History  Problem Relation Age of Onset   Cancer Maternal Grandfather     No Known Allergies  Outpatient Medications Prior to Visit  Medication Sig   celecoxib  (CELEBREX ) 200 MG capsule Take 1 capsule (200 mg total) by mouth daily.   cetirizine  (ZYRTEC ) 10 MG tablet Take 1 tablet (10 mg total) by mouth every morning.   fluticasone  (FLONASE ) 50 MCG/ACT nasal spray Place 1 spray into both nostrils 2 (two) times daily.  gabapentin  (NEURONTIN ) 300 MG capsule Take 1 capsule (300 mg total) by mouth 3 (three) times daily.   lisinopril  (ZESTRIL ) 20 MG tablet Take 1 tablet (20 mg total) by mouth every morning.   omeprazole  (PRILOSEC) 20 MG capsule Take 1 capsule (20 mg total) by mouth daily.   sildenafil (VIAGRA) 100 MG tablet Take 100 mg by mouth daily as needed for erectile dysfunction.   TRELEGY ELLIPTA  100-62.5-25 MCG/ACT AEPB Inhale 1 puff into the lungs daily.   Facility-Administered Medications Prior to Visit  Medication Dose Route Frequency Provider   heparin  lock flush 100 unit/mL  500 Units Intravenous Once Corcoran, Melissa C, MD   sodium chloride  0.9 % injection 10 mL  10 mL Intravenous PRN  Corcoran, Melissa C, MD   sodium chloride  flush (NS) 0.9 % injection 10 mL  10 mL Intravenous PRN Corcoran, Melissa C, MD   sodium chloride  flush (NS) 0.9 % injection 10 mL  10 mL Intravenous PRN Corcoran, Melissa C, MD   sodium chloride  flush (NS) 0.9 % injection 10 mL  10 mL Intravenous PRN Corcoran, Melissa C, MD    Review of Systems  Constitutional:  Positive for weight loss.  HENT: Negative.    Eyes: Negative.   Respiratory:  Positive for cough and shortness of breath (improved with mdi).   Cardiovascular: Negative.   Gastrointestinal: Negative.   Genitourinary: Negative.   Musculoskeletal:        Muscle cramps  Skin: Negative.   Neurological: Negative.   Endo/Heme/Allergies: Negative.        Objective:   BP 121/75   Pulse (!) 110   Temp (!) 97.4 F (36.3 C)   Ht 6' 1 (1.854 m)   Wt 134 lb (60.8 kg)   SpO2 94%   BMI 17.68 kg/m   Vitals:   10/13/23 0953  BP: 121/75  Pulse: (!) 110  Temp: (!) 97.4 F (36.3 C)  Height: 6' 1 (1.854 m)  Weight: 134 lb (60.8 kg)  SpO2: 94%  BMI (Calculated): 17.68    Physical Exam Vitals reviewed.  Constitutional:      Appearance: Normal appearance.  HENT:     Head: Normocephalic.     Left Ear: There is no impacted cerumen.     Nose: Nose normal.     Mouth/Throat:     Mouth: Mucous membranes are moist.     Pharynx: No posterior oropharyngeal erythema.  Eyes:     Extraocular Movements: Extraocular movements intact.     Pupils: Pupils are equal, round, and reactive to light.  Cardiovascular:     Rate and Rhythm: Regular rhythm.     Chest Wall: PMI is not displaced.     Pulses: Normal pulses.     Heart sounds: Normal heart sounds. No murmur heard. Pulmonary:     Effort: Pulmonary effort is normal.     Breath sounds: Normal air entry. Examination of the right-lower field reveals rales. Examination of the left-lower field reveals rales. Rales present. No rhonchi.     Comments: coarse Abdominal:     General: Abdomen  is flat. Bowel sounds are normal. There is no distension.     Palpations: Abdomen is soft. There is no hepatomegaly, splenomegaly or mass.     Tenderness: There is no abdominal tenderness.  Musculoskeletal:        General: Normal range of motion.     Cervical back: Normal range of motion and neck supple.     Right lower leg: No edema.  Left lower leg: No edema.  Skin:    General: Skin is warm and dry.  Neurological:     General: No focal deficit present.     Mental Status: He is alert and oriented to person, place, and time.     Cranial Nerves: No cranial nerve deficit.     Motor: No weakness.  Psychiatric:        Mood and Affect: Mood normal.        Behavior: Behavior normal.      No results found for any visits on 10/13/23.  Recent Results (from the past 2160 hours)  CBC With Diff/Platelet     Status: Abnormal   Collection Time: 09/30/23  9:18 AM  Result Value Ref Range   WBC 6.4 3.4 - 10.8 x10E3/uL   RBC 5.14 4.14 - 5.80 x10E6/uL   Hemoglobin 17.0 13.0 - 17.7 g/dL   Hematocrit 49.4 62.4 - 51.0 %   MCV 98 (H) 79 - 97 fL   MCH 33.1 (H) 26.6 - 33.0 pg   MCHC 33.7 31.5 - 35.7 g/dL   RDW 86.5 88.3 - 84.5 %   Platelets 150 150 - 450 x10E3/uL    Comment: Platelets appear clumped.   Neutrophils 57 Not Estab. %   Lymphs 31 Not Estab. %   Monocytes 10 Not Estab. %   Eos 1 Not Estab. %   Basos 1 Not Estab. %   Neutrophils Absolute 3.7 1.4 - 7.0 x10E3/uL   Lymphocytes Absolute 2.0 0.7 - 3.1 x10E3/uL   Monocytes Absolute 0.6 0.1 - 0.9 x10E3/uL   EOS (ABSOLUTE) 0.0 0.0 - 0.4 x10E3/uL   Basophils Absolute 0.1 0.0 - 0.2 x10E3/uL   Immature Granulocytes 0 Not Estab. %   Immature Grans (Abs) 0.0 0.0 - 0.1 x10E3/uL   Hematology Comments: Note:     Comment: CBC met reflex criteria for review of peripheral smear by medical laboratory professional. Automated results were confirmed by smear review.   Comprehensive metabolic panel with GFR     Status: Abnormal   Collection Time:  09/30/23  9:18 AM  Result Value Ref Range   Glucose 95 70 - 99 mg/dL   BUN 10 8 - 27 mg/dL   Creatinine, Ser 8.74 0.76 - 1.27 mg/dL   eGFR 62 >40 fO/fpw/8.26   BUN/Creatinine Ratio 8 (L) 10 - 24   Sodium 136 134 - 144 mmol/L   Potassium 5.3 (H) 3.5 - 5.2 mmol/L   Chloride 98 96 - 106 mmol/L   CO2 21 20 - 29 mmol/L   Calcium 10.3 (H) 8.6 - 10.2 mg/dL   Total Protein 7.7 6.0 - 8.5 g/dL   Albumin 4.5 3.8 - 4.8 g/dL   Globulin, Total 3.2 1.5 - 4.5 g/dL   Bilirubin Total 0.7 0.0 - 1.2 mg/dL   Alkaline Phosphatase 92 44 - 121 IU/L   AST 25 0 - 40 IU/L   ALT 13 0 - 44 IU/L  Lipid panel     Status: None   Collection Time: 09/30/23  9:18 AM  Result Value Ref Range   Cholesterol, Total 164 100 - 199 mg/dL   Triglycerides 83 0 - 149 mg/dL   HDL 68 >60 mg/dL   VLDL Cholesterol Cal 15 5 - 40 mg/dL   LDL Chol Calc (NIH) 81 0 - 99 mg/dL   Chol/HDL Ratio 2.4 0.0 - 5.0 ratio    Comment:  T. Chol/HDL Ratio                                             Men  Women                               1/2 Avg.Risk  3.4    3.3                                   Avg.Risk  5.0    4.4                                2X Avg.Risk  9.6    7.1                                3X Avg.Risk 23.4   11.0       Assessment & Plan:  As per problem list  Problem List Items Addressed This Visit       Cardiovascular and Mediastinum   Primary hypertension     Respiratory   Simple chronic bronchitis (HCC)   Other Visit Diagnoses       Hyperkalemia    -  Primary   Relevant Medications   sodium zirconium cyclosilicate  (LOKELMA ) 10 g PACK packet   Other Relevant Orders   Comprehensive metabolic panel with GFR     Muscle cramps       Relevant Medications   magnesium  oxide (MAGOX 400) 400 (240 Mg) MG tablet       Return in about 3 months (around 01/13/2024) for fu with labs prior.   Total time spent: 20 minutes  Sherrill Cinderella Perry, MD  10/13/2023   This document may  have been prepared by [facility] Voice Recognition software and as such may include unintentional dictation errors.

## 2023-10-16 ENCOUNTER — Ambulatory Visit: Admitting: Internal Medicine

## 2023-10-22 ENCOUNTER — Other Ambulatory Visit: Payer: Self-pay | Admitting: Internal Medicine

## 2023-10-22 DIAGNOSIS — J3089 Other allergic rhinitis: Secondary | ICD-10-CM

## 2023-11-17 ENCOUNTER — Other Ambulatory Visit: Payer: Self-pay | Admitting: Internal Medicine

## 2023-11-17 DIAGNOSIS — J410 Simple chronic bronchitis: Secondary | ICD-10-CM

## 2024-01-08 ENCOUNTER — Encounter: Payer: Self-pay | Admitting: Nurse Practitioner

## 2024-01-11 ENCOUNTER — Other Ambulatory Visit

## 2024-01-12 LAB — COMPREHENSIVE METABOLIC PANEL WITH GFR
ALT: 15 IU/L (ref 0–44)
AST: 26 IU/L (ref 0–40)
Albumin: 4.8 g/dL (ref 3.8–4.8)
Alkaline Phosphatase: 104 IU/L (ref 47–123)
BUN/Creatinine Ratio: 12 (ref 10–24)
BUN: 14 mg/dL (ref 8–27)
Bilirubin Total: 0.8 mg/dL (ref 0.0–1.2)
CO2: 17 mmol/L — ABNORMAL LOW (ref 20–29)
Calcium: 10.4 mg/dL — ABNORMAL HIGH (ref 8.6–10.2)
Chloride: 91 mmol/L — ABNORMAL LOW (ref 96–106)
Creatinine, Ser: 1.16 mg/dL (ref 0.76–1.27)
Globulin, Total: 3.1 g/dL (ref 1.5–4.5)
Glucose: 93 mg/dL (ref 70–99)
Potassium: 6.2 mmol/L — ABNORMAL HIGH (ref 3.5–5.2)
Sodium: 128 mmol/L — ABNORMAL LOW (ref 134–144)
Total Protein: 7.9 g/dL (ref 6.0–8.5)
eGFR: 67 mL/min/1.73 (ref >59)

## 2024-01-12 LAB — CBC WITH DIFF/PLATELET
Basophils Absolute: 0 x10E3/uL (ref 0.0–0.2)
Basos: 1 %
EOS (ABSOLUTE): 0 x10E3/uL (ref 0.0–0.4)
Eos: 0 %
Hematocrit: 51.1 % — ABNORMAL HIGH (ref 37.5–51.0)
Hemoglobin: 17.4 g/dL (ref 13.0–17.7)
Immature Grans (Abs): 0 x10E3/uL (ref 0.0–0.1)
Immature Granulocytes: 0 %
Lymphocytes Absolute: 1.7 x10E3/uL (ref 0.7–3.1)
Lymphs: 32 %
MCH: 33.3 pg — ABNORMAL HIGH (ref 26.6–33.0)
MCHC: 34.1 g/dL (ref 31.5–35.7)
MCV: 98 fL — ABNORMAL HIGH (ref 79–97)
Monocytes Absolute: 0.5 x10E3/uL (ref 0.1–0.9)
Monocytes: 10 %
Neutrophils Absolute: 3 x10E3/uL (ref 1.4–7.0)
Neutrophils: 57 %
Platelets: 144 x10E3/uL — ABNORMAL LOW (ref 150–450)
RBC: 5.23 x10E6/uL (ref 4.14–5.80)
RDW: 13.6 % (ref 11.6–15.4)
WBC: 5.3 x10E3/uL (ref 3.4–10.8)

## 2024-01-12 LAB — LIPID PANEL
Chol/HDL Ratio: 2.5 ratio (ref 0.0–5.0)
Cholesterol, Total: 196 mg/dL (ref 100–199)
HDL: 78 mg/dL (ref >39)
LDL Chol Calc (NIH): 101 mg/dL — ABNORMAL HIGH (ref 0–99)
Triglycerides: 96 mg/dL (ref 0–149)
VLDL Cholesterol Cal: 17 mg/dL (ref 5–40)

## 2024-01-15 ENCOUNTER — Ambulatory Visit (INDEPENDENT_AMBULATORY_CARE_PROVIDER_SITE_OTHER): Admitting: Internal Medicine

## 2024-01-15 ENCOUNTER — Encounter: Payer: Self-pay | Admitting: Internal Medicine

## 2024-01-15 ENCOUNTER — Ambulatory Visit: Payer: Self-pay | Admitting: Internal Medicine

## 2024-01-15 VITALS — BP 120/74 | HR 98 | Temp 97.2°F | Ht 73.0 in | Wt 136.0 lb

## 2024-01-15 DIAGNOSIS — D696 Thrombocytopenia, unspecified: Secondary | ICD-10-CM

## 2024-01-15 DIAGNOSIS — J410 Simple chronic bronchitis: Secondary | ICD-10-CM

## 2024-01-15 DIAGNOSIS — E7800 Pure hypercholesterolemia, unspecified: Secondary | ICD-10-CM | POA: Insufficient documentation

## 2024-01-15 DIAGNOSIS — D751 Secondary polycythemia: Secondary | ICD-10-CM

## 2024-01-15 DIAGNOSIS — I10 Essential (primary) hypertension: Secondary | ICD-10-CM

## 2024-01-15 DIAGNOSIS — F17200 Nicotine dependence, unspecified, uncomplicated: Secondary | ICD-10-CM

## 2024-01-15 DIAGNOSIS — E875 Hyperkalemia: Secondary | ICD-10-CM | POA: Insufficient documentation

## 2024-01-15 MED ORDER — LOKELMA 10 G PO PACK: 10.0000 g | PACK | Freq: Three times a day (TID) | ORAL | Status: AC

## 2024-01-15 MED ORDER — ROSUVASTATIN CALCIUM 5 MG PO TABS: 5.0000 mg | ORAL_TABLET | Freq: Every day | ORAL | 11 refills | Status: AC

## 2024-01-15 MED ORDER — OLMESARTAN MEDOXOMIL 20 MG PO TABS: 20.0000 mg | ORAL_TABLET | Freq: Every day | ORAL | 0 refills | Status: DC

## 2024-01-15 NOTE — Progress Notes (Signed)
 Established Patient Office Visit  Subjective:  Patient ID: William Ford, male    DOB: 12-Nov-1950  Age: 73 y.o. MRN: [id number]  Chief Complaint  Patient presents with   Follow-up    3 month lab results    No new complaints, here for lab review and medication refills. Labs reviewed and notable for hyperkalemia, hyponatremia and hypercalcemia with acidosis. Admits to poor fluid intake.     No other concerns at this time.   Past Medical History:  Diagnosis Date   Arthritis    hands   Colon cancer (HCC)    Emphysema of lung (HCC)    mild (per pt)   Hypertension    Numbness    toes and fingers (Kayman Snuffer/P chemo - per pt)    Past Surgical History:  Procedure Laterality Date   AMPUTATION Right 2003   4th    CATARACT EXTRACTION W/PHACO Right 02/02/2017   Procedure: CATARACT EXTRACTION PHACO AND INTRAOCULAR LENS PLACEMENT (IOC) RIGHT;  Surgeon: Mittie Gaskin, MD;  Location: [facility];  Service: Ophthalmology;  Laterality: Right;   CATARACT EXTRACTION W/PHACO Left 02/25/2017   Procedure: CATARACT EXTRACTION PHACO AND INTRAOCULAR LENS PLACEMENT (IOC) left;  Surgeon: Mittie Gaskin, MD;  Location: [facility];  Service: Ophthalmology;  Laterality: Left;   COLON SURGERY  2013   COLONOSCOPY     COLONOSCOPY WITH PROPOFOL  N/A 07/16/2018   Procedure: COLONOSCOPY WITH PROPOFOL ;  Surgeon: Gaylyn Gladis PENNER, MD;  Location: [facility] ENDOSCOPY;  Service: Endoscopy;  Laterality: N/A;   ESOPHAGOGASTRODUODENOSCOPY (EGD) WITH PROPOFOL  N/A 07/16/2018   Procedure: ESOPHAGOGASTRODUODENOSCOPY (EGD) WITH PROPOFOL ;  Surgeon: Gaylyn Gladis PENNER, MD;  Location: [facility] END[facility]COPY;  Service: Endoscopy;  Laterality: N/A;    Social History   Socioeconomic History   Marital status: Divorced    Spouse name: Not on file   Number of children: Not on file   Years of education: Not on file   Highest education level: Not on file  Occupational History   Not on file  Tobacco Use   Smoking  status: Every Day    Current packs/day: 0.50    Average packs/day: 0.5 packs/day for 45.0 years (22.5 ttl pk-yrs)    Types: Cigarettes   Smokeless tobacco: Never   Tobacco comments:    since age 53  Vaping Use   Vaping status: Never Used  Substance and Sexual Activity   Alcohol use: Yes    Alcohol/week: 2.0 standard drinks of alcohol    Types: 2 Cans of beer per week   Drug use: No   Sexual activity: Not on file  Other Topics Concern   Not on file  Social History Narrative   Not on file   Social Drivers of Health   Financial Resource Strain: Not on file  Food Insecurity: Not on file  Transportation Needs: Not on file  Physical Activity: Not on file  Stress: Not on file  Social Connections: Not on file  Intimate Partner Violence: Not on file    Family History  Problem Relation Age of Onset   Cancer Maternal Grandfather     No Known Allergies  Outpatient Medications Prior to Visit  Medication Sig   celecoxib  (CELEBREX ) 200 MG capsule Take 1 capsule (200 mg total) by mouth daily.   cetirizine  (ZYRTEC ) 10 MG tablet TAKE 1 TABLET BY MOUTH EVERY MORNING   fluticasone  (FLONASE ) 50 MCG/ACT nasal spray Place 1 spray into both nostrils 2 (two) times daily.   Fluticasone -Umeclidin-Vilant (TRELEGY ELLIPTA ) 100-62.5-25 MCG/ACT AEPB  INHALE 1 PUFF INTO THE LUNGS DAILY AS DIRECTED.   gabapentin  (NEURONTIN ) 300 MG capsule Take 1 capsule (300 mg total) by mouth 3 (three) times daily.   magnesium  oxide (MAG-OX) 400 (240 Mg) MG tablet Take 400 mg by mouth daily.   omeprazole  (PRILOSEC) 20 MG capsule Take 1 capsule (20 mg total) by mouth daily.   sildenafil (VIAGRA) 100 MG tablet Take 100 mg by mouth daily as needed for erectile dysfunction.   [DISCONTINUED] lisinopril  (ZESTRIL ) 20 MG tablet Take 1 tablet (20 mg total) by mouth every morning.   Facility-Administered Medications Prior to Visit  Medication Dose Route Frequency Provider   heparin  lock flush 100 unit/mL  500 Units  Intravenous Once Corcoran, Melissa C, MD   sodium chloride  0.9 % injection 10 mL  10 mL Intravenous PRN Corcoran, Melissa C, MD   sodium chloride  flush (NS) 0.9 % injection 10 mL  10 mL Intravenous PRN Corcoran, Melissa C, MD   sodium chloride  flush (NS) 0.9 % injection 10 mL  10 mL Intravenous PRN Corcoran, Melissa C, MD   sodium chloride  flush (NS) 0.9 % injection 10 mL  10 mL Intravenous PRN Corcoran, Melissa C, MD    Review of Systems  Constitutional:  Negative for weight loss.  HENT: Negative.    Eyes: Negative.   Respiratory:  Positive for cough and shortness of breath (improved with mdi).   Cardiovascular: Negative.   Gastrointestinal: Negative.   Genitourinary: Negative.   Musculoskeletal:        Muscle cramps  Skin: Negative.   Neurological: Negative.   Endo/Heme/Allergies: Negative.  Does not bruise/bleed easily.       Objective:   BP 120/74   Pulse 98   Temp (!) 97.2 F (36.2 C)   Ht 6' 1 (1.854 m)   Wt 136 lb (61.7 kg)   SpO2 99%   BMI 17.94 kg/m   Vitals:   01/15/24 0955  BP: 120/74  Pulse: 98  Temp: (!) 97.2 F (36.2 C)  Height: 6' 1 (1.854 m)  Weight: 136 lb (61.7 kg)  SpO2: 99%  BMI (Calculated): 17.95    Physical Exam Vitals reviewed.  Constitutional:      Appearance: Normal appearance.  HENT:     Head: Normocephalic.     Left Ear: There is no impacted cerumen.     Nose: Nose normal.     Mouth/Throat:     Mouth: Mucous membranes are moist.     Pharynx: No posterior oropharyngeal erythema.  Eyes:     Extraocular Movements: Extraocular movements intact.     Pupils: Pupils are equal, round, and reactive to light.  Cardiovascular:     Rate and Rhythm: Regular rhythm.     Chest Wall: PMI is not displaced.     Pulses: Normal pulses.     Heart sounds: Normal heart sounds. No murmur heard. Pulmonary:     Effort: Pulmonary effort is normal.     Breath sounds: Normal air entry. No rhonchi.     Comments: coarse Abdominal:     General:  Abdomen is flat. Bowel sounds are normal. There is no distension.     Palpations: Abdomen is soft. There is no hepatomegaly, splenomegaly or mass.     Tenderness: There is no abdominal tenderness.  Musculoskeletal:        General: Normal range of motion.     Cervical back: Normal range of motion and neck supple.     Right lower leg: No edema.  Left lower leg: No edema.  Skin:    General: Skin is warm and dry.  Neurological:     General: No focal deficit present.     Mental Status: He is alert and oriented to person, place, and time.     Cranial Nerves: No cranial nerve deficit.     Motor: No weakness.  Psychiatric:        Mood and Affect: Mood normal.        Behavior: Behavior normal.      No results found for any visits on 01/15/24.  Recent Results (from the past 2160 hours)  Comprehensive metabolic panel with GFR     Status: Abnormal   Collection Time: 01/11/24 10:16 AM  Result Value Ref Range   Glucose 93 70 - 99 mg/dL   BUN 14 8 - 27 mg/dL   Creatinine, Ser 8.83 0.76 - 1.27 mg/dL   eGFR 67 >40 fO/fpw/8.26   BUN/Creatinine Ratio 12 10 - 24   Sodium 128 (L) 134 - 144 mmol/L   Potassium 6.2 (H) 3.5 - 5.2 mmol/L   Chloride 91 (L) 96 - 106 mmol/L   CO2 17 (L) 20 - 29 mmol/L   Calcium 10.4 (H) 8.6 - 10.2 mg/dL   Total Protein 7.9 6.0 - 8.5 g/dL   Albumin 4.8 3.8 - 4.8 g/dL   Globulin, Total 3.1 1.5 - 4.5 g/dL   Bilirubin Total 0.8 0.0 - 1.2 mg/dL   Alkaline Phosphatase 104 47 - 123 IU/L   AST 26 0 - 40 IU/L   ALT 15 0 - 44 IU/L  CBC With Diff/Platelet     Status: Abnormal   Collection Time: 01/11/24 10:31 AM  Result Value Ref Range   WBC 5.3 3.4 - 10.8 x10E3/uL   RBC 5.23 4.14 - 5.80 x10E6/uL   Hemoglobin 17.4 13.0 - 17.7 g/dL   Hematocrit 48.8 (H) 62.4 - 51.0 %   MCV 98 (H) 79 - 97 fL   MCH 33.3 (H) 26.6 - 33.0 pg   MCHC 34.1 31.5 - 35.7 g/dL   RDW 86.3 88.3 - 84.5 %   Platelets 144 (L) 150 - 450 x10E3/uL    Comment: Platelets appear clumped.   Neutrophils  57 Not Estab. %   Lymphs 32 Not Estab. %   Monocytes 10 Not Estab. %   Eos 0 Not Estab. %   Basos 1 Not Estab. %   Neutrophils Absolute 3.0 1.4 - 7.0 x10E3/uL   Lymphocytes Absolute 1.7 0.7 - 3.1 x10E3/uL   Monocytes Absolute 0.5 0.1 - 0.9 x10E3/uL   EOS (ABSOLUTE) 0.0 0.0 - 0.4 x10E3/uL   Basophils Absolute 0.0 0.0 - 0.2 x10E3/uL   Immature Granulocytes 0 Not Estab. %   Immature Grans (Abs) 0.0 0.0 - 0.1 x10E3/uL   Hematology Comments: Note:     Comment: CBC met reflex criteria for review of peripheral smear by medical laboratory professional. Automated results were confirmed by smear review.   Lipid panel     Status: Abnormal   Collection Time: 01/11/24 10:32 AM  Result Value Ref Range   Cholesterol, Total 196 100 - 199 mg/dL   Triglycerides 96 0 - 149 mg/dL   HDL 78 >60 mg/dL   VLDL Cholesterol Cal 17 5 - 40 mg/dL   LDL Chol Calc (NIH) 898 (H) 0 - 99 mg/dL   Chol/HDL Ratio 2.5 0.0 - 5.0 ratio    Comment:  T. Chol/HDL Ratio                                             Men  Women                               1/2 Avg.Risk  3.4    3.3                                   Avg.Risk  5.0    4.4                                2X Avg.Risk  9.6    7.1                                3X Avg.Risk 23.4   11.0       Assessment & Plan:  Shaquile was seen today for follow-up.  Thrombocytopenia  Hyperkalemia -     Lokelma ; Take 10 g by mouth 3 (three) times daily for 3 doses. -     BMP8+Anion Gap  Primary hypertension  Simple chronic bronchitis (HCC)  Polycythemia, secondary  Hypercalcemia -     BMP8+Anion Gap -     Phosphorus -     Parathyroid hormone, intact (no Ca) -     PTH-related peptide  Pure hypercholesterolemia -     Rosuvastatin Calcium; Take 1 tablet (5 mg total) by mouth daily.  Dispense: 30 tablet; Refill: 11  Other orders -     Olmesartan Medoxomil; Take 1 tablet (20 mg total) by mouth daily.  Dispense: 90 tablet; Refill:  0    Problem List Items Addressed This Visit       Cardiovascular and Mediastinum   Primary hypertension   Relevant Medications   olmesartan (BENICAR) 20 MG tablet   rosuvastatin (CRESTOR) 5 MG tablet     Respiratory   Simple chronic bronchitis (HCC)     Other   Polycythemia, secondary   Other Visit Diagnoses       Thrombocytopenia    -  Primary     Hyperkalemia       Relevant Medications   sodium zirconium cyclosilicate  (LOKELMA ) 10 g PACK packet   Other Relevant Orders   BMP8+Anion Gap     Hypercalcemia       Relevant Orders   BMP8+Anion Gap   Phosphorus   Parathyroid hormone, intact (no Ca)   PTH-Related Peptide     Pure hypercholesterolemia       Relevant Medications   olmesartan (BENICAR) 20 MG tablet   rosuvastatin (CRESTOR) 5 MG tablet       Return in about 3 weeks (around 02/05/2024) for BP followup.   Total time spent: 30 minutes. This time includes review of previous notes and results and patient face to face interaction during today'Cyle Kenyon visit.    Sherrill Cinderella Perry, MD  01/15/2024   This document may have been prepared by [facility] Voice Recognition software and as such may include unintentional dictation errors.

## 2024-01-18 ENCOUNTER — Other Ambulatory Visit: Payer: Self-pay | Admitting: Internal Medicine

## 2024-01-18 ENCOUNTER — Other Ambulatory Visit

## 2024-01-18 DIAGNOSIS — K219 Gastro-esophageal reflux disease without esophagitis: Secondary | ICD-10-CM

## 2024-01-18 DIAGNOSIS — J3089 Other allergic rhinitis: Secondary | ICD-10-CM

## 2024-01-23 LAB — BMP8+ANION GAP
Anion Gap: 16 mmol/L (ref 10.0–18.0)
BUN/Creatinine Ratio: 8 — ABNORMAL LOW (ref 10–24)
BUN: 10 mg/dL (ref 8–27)
CO2: 21 mmol/L (ref 20–29)
Calcium: 10.2 mg/dL (ref 8.6–10.2)
Chloride: 94 mmol/L — ABNORMAL LOW (ref 96–106)
Creatinine, Ser: 1.28 mg/dL — ABNORMAL HIGH (ref 0.76–1.27)
Glucose: 124 mg/dL — ABNORMAL HIGH (ref 70–99)
Potassium: 5.2 mmol/L (ref 3.5–5.2)
Sodium: 131 mmol/L — ABNORMAL LOW (ref 134–144)
eGFR: 60 mL/min/1.73 (ref >59)

## 2024-01-23 LAB — PHOSPHORUS: Phosphorus: 2.8 mg/dL (ref 2.8–4.1)

## 2024-01-23 LAB — PARATHYROID HORMONE, INTACT (NO CA): PTH: 38 pg/mL (ref 15–65)

## 2024-01-23 LAB — PTH-RELATED PEPTIDE: PTH-related peptide: <2 pmol/L

## 2024-01-26 ENCOUNTER — Ambulatory Visit: Payer: Self-pay | Admitting: Internal Medicine

## 2024-01-26 DIAGNOSIS — D696 Thrombocytopenia, unspecified: Secondary | ICD-10-CM

## 2024-02-08 ENCOUNTER — Ambulatory Visit (INDEPENDENT_AMBULATORY_CARE_PROVIDER_SITE_OTHER): Admitting: Internal Medicine

## 2024-02-08 DIAGNOSIS — J410 Simple chronic bronchitis: Secondary | ICD-10-CM | POA: Diagnosis not present

## 2024-02-08 DIAGNOSIS — I10 Essential (primary) hypertension: Secondary | ICD-10-CM

## 2024-02-08 DIAGNOSIS — D696 Thrombocytopenia, unspecified: Secondary | ICD-10-CM

## 2024-02-08 LAB — CBC WITH DIFF/PLATELET
Basophils Absolute: 0 x10E3/uL (ref 0.0–0.2)
Basos: 1 %
EOS (ABSOLUTE): 0 x10E3/uL (ref 0.0–0.4)
Eos: 1 %
Hematocrit: 34.2 % — ABNORMAL LOW (ref 37.5–51.0)
Hemoglobin: 11 g/dL — ABNORMAL LOW (ref 13.0–17.7)
Immature Grans (Abs): 0 x10E3/uL (ref 0.0–0.1)
Immature Granulocytes: 0 %
Lymphocytes Absolute: 1.6 x10E3/uL (ref 0.7–3.1)
Lymphs: 37 %
MCH: 31.3 pg (ref 26.6–33.0)
MCHC: 32.2 g/dL (ref 31.5–35.7)
MCV: 97 fL (ref 79–97)
Monocytes Absolute: 0.4 x10E3/uL (ref 0.1–0.9)
Monocytes: 10 %
Neutrophils Absolute: 2.2 x10E3/uL (ref 1.4–7.0)
Neutrophils: 51 %
Platelets: 176 x10E3/uL (ref 150–450)
RBC: 3.51 x10E6/uL — ABNORMAL LOW (ref 4.14–5.80)
RDW: 12.1 % (ref 11.6–15.4)
WBC: 4.2 x10E3/uL (ref 3.4–10.8)

## 2024-02-08 MED ORDER — COMBIVENT RESPIMAT 20-100 MCG/ACT IN AERS: 1.0000 | INHALATION_SPRAY | Freq: Four times a day (QID) | RESPIRATORY_TRACT | 2 refills | Status: DC | PRN

## 2024-02-08 NOTE — Progress Notes (Signed)
 Established Patient Office Visit  Subjective:  Patient ID: William Ford, male    DOB: 10/10/1950  Age: 73 y.o. MRN: [id number]  Chief Complaint  Patient presents with  . Follow-up    3 week follow up, bp check    No new complaints, here for lab review. Ca normalized with normal PTH and no bleeding.     No other concerns at this time.   Past Medical History:  Diagnosis Date  . Arthritis    hands  . Colon cancer (HCC)   . Emphysema of lung (HCC)    mild (per pt)  . Hypertension   . Numbness    toes and fingers (Shawnette Augello/P chemo - per pt)    Past Surgical History:  Procedure Laterality Date  . AMPUTATION Right 2003   4th   . CATARACT EXTRACTION W/PHACO Right 02/02/2017   Procedure: CATARACT EXTRACTION PHACO AND INTRAOCULAR LENS PLACEMENT (IOC) RIGHT;  Surgeon: Mittie Gaskin, MD;  Location: [facility];  Service: Ophthalmology;  Laterality: Right;  . CATARACT EXTRACTION W/PHACO Left 02/25/2017   Procedure: CATARACT EXTRACTION PHACO AND INTRAOCULAR LENS PLACEMENT (IOC) left;  Surgeon: Mittie Gaskin, MD;  Location: [facility];  Service: Ophthalmology;  Laterality: Left;  . COLON SURGERY  2013  . COLONOSCOPY    . COLONOSCOPY WITH PROPOFOL  N/A 07/16/2018   Procedure: COLONOSCOPY WITH PROPOFOL ;  Surgeon: Gaylyn Gladis PENNER, MD;  Location: [facility] END[facility]COPY;  Service: Endoscopy;  Laterality: N/A;  . ESOPHAGOGASTRODUODENOSCOPY (EGD) WITH PROPOFOL  N/A 07/16/2018   Procedure: ESOPHAGOGASTRODUODENOSCOPY (EGD) WITH PROPOFOL ;  Surgeon: Gaylyn Gladis PENNER, MD;  Location: [facility]COPY;  Service: Endoscopy;  Laterality: N/A;    Social History   Socioeconomic History  . Marital status: Divorced    Spouse name: Not on file  . Number of children: Not on file  . Years of education: Not on file  . Highest education level: Not on file  Occupational History  . Not on file  Tobacco Use  . Smoking status: Every Day    Current packs/day: 0.50    Average packs/day:  0.5 packs/day for 45.0 years (22.5 ttl pk-yrs)    Types: Cigarettes  . Smokeless tobacco: Never  . Tobacco comments:    since age 77  Vaping Use  . Vaping status: Never Used  Substance and Sexual Activity  . Alcohol use: Yes    Alcohol/week: 2.0 standard drinks of alcohol    Types: 2 Cans of beer per week  . Drug use: No  . Sexual activity: Not on file  Other Topics Concern  . Not on file  Social History Narrative  . Not on file   Social Drivers of Health   Financial Resource Strain: Not on file  Food Insecurity: Not on file  Transportation Needs: Not on file  Physical Activity: Not on file  Stress: Not on file  Social Connections: Not on file  Intimate Partner Violence: Not on file    Family History  Problem Relation Age of Onset  . Cancer Maternal Grandfather     No Known Allergies  Outpatient Medications Prior to Visit  Medication Sig  . celecoxib  (CELEBREX ) 200 MG capsule Take 1 capsule (200 mg total) by mouth daily.  . cetirizine  (ZYRTEC ) 10 MG tablet TAKE 1 TABLET BY MOUTH EVERY MORNING  . fluticasone  (FLONASE ) 50 MCG/ACT nasal spray Place 1 spray into both nostrils 2 (two) times daily.  . Fluticasone -Umeclidin-Vilant (TRELEGY ELLIPTA ) 100-62.5-25 MCG/ACT AEPB INHALE 1 PUFF INTO THE LUNGS DAILY AS DIRECTED.  SABRA  gabapentin  (NEURONTIN ) 300 MG capsule Take 1 capsule (300 mg total) by mouth 3 (three) times daily.  SABRA olmesartan (BENICAR) 20 MG tablet Take 1 tablet (20 mg total) by mouth daily.  . omeprazole  (PRILOSEC) 20 MG capsule TAKE 1 CAPSULE BY MOUTH ONCE DAILY  . rosuvastatin (CRESTOR) 5 MG tablet Take 1 tablet (5 mg total) by mouth daily.  . sildenafil (VIAGRA) 100 MG tablet Take 100 mg by mouth daily as needed for erectile dysfunction.   Facility-Administered Medications Prior to Visit  Medication Dose Route Frequency Provider  . heparin  lock flush 100 unit/mL  500 Units Intravenous Once Corcoran, Melissa C, MD  . sodium chloride  0.9 % injection 10 mL  10 mL  Intravenous PRN Corcoran, Melissa C, MD  . sodium chloride  flush (NS) 0.9 % injection 10 mL  10 mL Intravenous PRN Corcoran, Melissa C, MD  . sodium chloride  flush (NS) 0.9 % injection 10 mL  10 mL Intravenous PRN Corcoran, Melissa C, MD  . sodium chloride  flush (NS) 0.9 % injection 10 mL  10 mL Intravenous PRN Corcoran, Melissa C, MD    Review of Systems  Constitutional:  Negative for weight loss.  HENT: Negative.    Eyes: Negative.   Respiratory:  Positive for cough and shortness of breath (improved with mdi).   Cardiovascular: Negative.   Gastrointestinal: Negative.   Genitourinary: Negative.   Musculoskeletal:        Muscle cramps  Skin: Negative.   Neurological: Negative.   Endo/Heme/Allergies: Negative.  Does not bruise/bleed easily.       Objective:   BP 124/62   Pulse (!) 109   Temp 97.9 F (36.6 C)   Ht 6' 1 (1.854 m)   Wt 133 lb 9.6 oz (60.6 kg)   SpO2 97%   BMI 17.63 kg/m   Vitals:   02/08/24 1114  BP: 124/62  Pulse: (!) 109  Temp: 97.9 F (36.6 C)  Height: 6' 1 (1.854 m)  Weight: 133 lb 9.6 oz (60.6 kg)  SpO2: 97%  BMI (Calculated): 17.63    Physical Exam Vitals reviewed.  Constitutional:      Appearance: Normal appearance.  HENT:     Head: Normocephalic.     Left Ear: There is no impacted cerumen.     Nose: Nose normal.     Mouth/Throat:     Mouth: Mucous membranes are moist.     Pharynx: No posterior oropharyngeal erythema.  Eyes:     Extraocular Movements: Extraocular movements intact.     Pupils: Pupils are equal, round, and reactive to light.  Cardiovascular:     Rate and Rhythm: Regular rhythm.     Chest Wall: PMI is not displaced.     Pulses: Normal pulses.     Heart sounds: Normal heart sounds. No murmur heard. Pulmonary:     Effort: Pulmonary effort is normal.     Breath sounds: Normal air entry. No rhonchi.     Comments: coarse Abdominal:     General: Abdomen is flat. Bowel sounds are normal. There is no distension.      Palpations: Abdomen is soft. There is no hepatomegaly, splenomegaly or mass.     Tenderness: There is no abdominal tenderness.  Musculoskeletal:        General: Normal range of motion.     Cervical back: Normal range of motion and neck supple.     Right lower leg: No edema.     Left lower leg: No edema.  Skin:  General: Skin is warm and dry.  Neurological:     General: No focal deficit present.     Mental Status: He is alert and oriented to person, place, and time.     Cranial Nerves: No cranial nerve deficit.     Motor: No weakness.  Psychiatric:        Mood and Affect: Mood normal.        Behavior: Behavior normal.      No results found for any visits on 02/08/24.  Recent Results (from the past 2160 hours)  Comprehensive metabolic panel with GFR     Status: Abnormal   Collection Time: 01/11/24 10:16 AM  Result Value Ref Range   Glucose 93 70 - 99 mg/dL   BUN 14 8 - 27 mg/dL   Creatinine, Ser 8.83 0.76 - 1.27 mg/dL   eGFR 67 >40 fO/fpw/8.26   BUN/Creatinine Ratio 12 10 - 24   Sodium 128 (L) 134 - 144 mmol/L   Potassium 6.2 (H) 3.5 - 5.2 mmol/L   Chloride 91 (L) 96 - 106 mmol/L   CO2 17 (L) 20 - 29 mmol/L   Calcium 10.4 (H) 8.6 - 10.2 mg/dL   Total Protein 7.9 6.0 - 8.5 g/dL   Albumin 4.8 3.8 - 4.8 g/dL   Globulin, Total 3.1 1.5 - 4.5 g/dL   Bilirubin Total 0.8 0.0 - 1.2 mg/dL   Alkaline Phosphatase 104 47 - 123 IU/L   AST 26 0 - 40 IU/L   ALT 15 0 - 44 IU/L  CBC With Diff/Platelet     Status: Abnormal   Collection Time: 01/11/24 10:31 AM  Result Value Ref Range   WBC 5.3 3.4 - 10.8 x10E3/uL   RBC 5.23 4.14 - 5.80 x10E6/uL   Hemoglobin 17.4 13.0 - 17.7 g/dL   Hematocrit 48.8 (H) 62.4 - 51.0 %   MCV 98 (H) 79 - 97 fL   MCH 33.3 (H) 26.6 - 33.0 pg   MCHC 34.1 31.5 - 35.7 g/dL   RDW 86.3 88.3 - 84.5 %   Platelets 144 (L) 150 - 450 x10E3/uL    Comment: Platelets appear clumped.   Neutrophils 57 Not Estab. %   Lymphs 32 Not Estab. %   Monocytes 10 Not Estab. %    Eos 0 Not Estab. %   Basos 1 Not Estab. %   Neutrophils Absolute 3.0 1.4 - 7.0 x10E3/uL   Lymphocytes Absolute 1.7 0.7 - 3.1 x10E3/uL   Monocytes Absolute 0.5 0.1 - 0.9 x10E3/uL   EOS (ABSOLUTE) 0.0 0.0 - 0.4 x10E3/uL   Basophils Absolute 0.0 0.0 - 0.2 x10E3/uL   Immature Granulocytes 0 Not Estab. %   Immature Grans (Abs) 0.0 0.0 - 0.1 x10E3/uL   Hematology Comments: Note:     Comment: CBC met reflex criteria for review of peripheral smear by medical laboratory professional. Automated results were confirmed by smear review.   Lipid panel     Status: Abnormal   Collection Time: 01/11/24 10:32 AM  Result Value Ref Range   Cholesterol, Total 196 100 - 199 mg/dL   Triglycerides 96 0 - 149 mg/dL   HDL 78 >60 mg/dL   VLDL Cholesterol Cal 17 5 - 40 mg/dL   LDL Chol Calc (NIH) 898 (H) 0 - 99 mg/dL   Chol/HDL Ratio 2.5 0.0 - 5.0 ratio    Comment:  T. Chol/HDL Ratio                                             Men  Women                               1/2 Avg.Risk  3.4    3.3                                   Avg.Risk  5.0    4.4                                2X Avg.Risk  9.6    7.1                                3X Avg.Risk 23.4   11.0   BMP8+Anion Gap     Status: Abnormal   Collection Time: 01/18/24 10:10 AM  Result Value Ref Range   Glucose 124 (H) 70 - 99 mg/dL   BUN 10 8 - 27 mg/dL   Creatinine, Ser 8.71 (H) 0.76 - 1.27 mg/dL   eGFR 60 >40 fO/fpw/8.26   BUN/Creatinine Ratio 8 (L) 10 - 24   Sodium 131 (L) 134 - 144 mmol/L   Potassium 5.2 3.5 - 5.2 mmol/L   Chloride 94 (L) 96 - 106 mmol/L   CO2 21 20 - 29 mmol/L   Anion Gap 16.0 10.0 - 18.0 mmol/L   Calcium 10.2 8.6 - 10.2 mg/dL  Phosphorus     Status: None   Collection Time: 01/18/24 10:10 AM  Result Value Ref Range   Phosphorus 2.8 2.8 - 4.1 mg/dL  Parathyroid hormone, intact (no Ca)     Status: None   Collection Time: 01/18/24 10:10 AM  Result Value Ref Range   PTH 38 15 - 65 pg/mL   PTH-Related Peptide     Status: None   Collection Time: 01/18/24 10:10 AM  Result Value Ref Range   PTH-related peptide <2.0 pmol/L    Comment: This test was developed and its performance characteristics determined by [facility]. It has not been cleared or approved by the Food and Drug Administration. Reference Range: All Ages: <2.0 The PTHrP assay should not be used to exclude cancer or screen tumor patients for humoral hypercalcemia of malignancy (HHM). The results should always be assessed in conjunction with the patient'Dessiree Sze medical history, clinical examination, and other findings. If test results are clinically discordant, please contact the laboratory.       Assessment & Plan:  Jayvier was seen today for follow-up.  Hypercalcemia -     BMP8+Anion Gap  Primary hypertension  Simple chronic bronchitis (HCC) -     Combivent Respimat; Inhale 1 puff into the lungs every 6 (six) hours as needed for wheezing.  Dispense: 4 g; Refill: 2  Thrombocytopenia -     CBC With Diff/Platelet    Problem List Items Addressed This Visit       Cardiovascular and Mediastinum   Primary hypertension     Respiratory   Simple chronic bronchitis (HCC)   Relevant Medications   Ipratropium-Albuterol (COMBIVENT  RESPIMAT) 20-100 MCG/ACT AERS respimat   Other Visit Diagnoses       Hypercalcemia    -  Primary   Relevant Orders   BMP8+Anion Gap     Thrombocytopenia           Return in about 10 weeks (around 04/18/2024) for fu with labs prior.   Total time spent: 20 minutes. This time includes review of previous notes and results and patient face to face interaction during today'Gearold Wainer visit.    Sherrill Cinderella Perry, MD  02/08/2024   This document may have been prepared by [facility] Voice Recognition software and as such may include unintentional dictation errors.

## 2024-02-09 LAB — BMP8+ANION GAP
Anion Gap: 16 mmol/L (ref 10.0–18.0)
BUN/Creatinine Ratio: 8 — ABNORMAL LOW (ref 10–24)
BUN: 10 mg/dL (ref 8–27)
CO2: 21 mmol/L (ref 20–29)
Calcium: 9.8 mg/dL (ref 8.6–10.2)
Chloride: 96 mmol/L (ref 96–106)
Creatinine, Ser: 1.25 mg/dL (ref 0.76–1.27)
Glucose: 152 mg/dL — ABNORMAL HIGH (ref 70–99)
Potassium: 4.2 mmol/L (ref 3.5–5.2)
Sodium: 133 mmol/L — ABNORMAL LOW (ref 134–144)
eGFR: 61 mL/min/1.73 (ref >59)

## 2024-02-23 ENCOUNTER — Other Ambulatory Visit: Payer: Self-pay | Admitting: Internal Medicine

## 2024-02-23 DIAGNOSIS — M2550 Pain in unspecified joint: Secondary | ICD-10-CM

## 2024-04-11 ENCOUNTER — Other Ambulatory Visit: Payer: Self-pay | Admitting: Internal Medicine

## 2024-04-18 ENCOUNTER — Ambulatory Visit

## 2024-04-18 ENCOUNTER — Encounter: Payer: Self-pay | Admitting: Internal Medicine

## 2024-04-18 ENCOUNTER — Ambulatory Visit: Admitting: Internal Medicine

## 2024-04-18 VITALS — BP 122/72 | HR 105 | Ht 72.0 in | Wt 136.2 lb

## 2024-04-18 DIAGNOSIS — E7800 Pure hypercholesterolemia, unspecified: Secondary | ICD-10-CM

## 2024-04-18 DIAGNOSIS — I10 Essential (primary) hypertension: Secondary | ICD-10-CM | POA: Diagnosis not present

## 2024-04-18 DIAGNOSIS — I7300 Raynaud's syndrome without gangrene: Secondary | ICD-10-CM | POA: Diagnosis not present

## 2024-04-18 DIAGNOSIS — M2550 Pain in unspecified joint: Secondary | ICD-10-CM

## 2024-04-18 DIAGNOSIS — N400 Enlarged prostate without lower urinary tract symptoms: Secondary | ICD-10-CM

## 2024-04-18 DIAGNOSIS — K219 Gastro-esophageal reflux disease without esophagitis: Secondary | ICD-10-CM | POA: Diagnosis not present

## 2024-04-18 DIAGNOSIS — J410 Simple chronic bronchitis: Secondary | ICD-10-CM

## 2024-04-18 MED ORDER — CELECOXIB 200 MG PO CAPS: 200.0000 mg | ORAL_CAPSULE | Freq: Every day | ORAL | 1 refills | Status: AC

## 2024-04-18 MED ORDER — COMBIVENT RESPIMAT 20-100 MCG/ACT IN AERS: 1.0000 | INHALATION_SPRAY | Freq: Four times a day (QID) | RESPIRATORY_TRACT | 2 refills | Status: AC | PRN

## 2024-04-18 MED ORDER — AMLODIPINE BESYLATE 2.5 MG PO TABS: 2.5000 mg | ORAL_TABLET | Freq: Every day | ORAL | 2 refills | Status: AC

## 2024-04-18 MED ORDER — OMEPRAZOLE 20 MG PO CPDR: 20.0000 mg | DELAYED_RELEASE_CAPSULE | Freq: Every day | ORAL | 0 refills | Status: AC

## 2024-04-18 NOTE — Progress Notes (Signed)
 "  Established Patient Office Visit  Subjective:  Patient ID: William Ford, male    DOB: 09/20/1950  Age: 74 y.o. MRN: [id number]  Chief Complaint  Patient presents with   Results    10 week follow up lab results     C/o discoloration of fingers in cold weather but painless. Also here for lab review and medication refills.     No other concerns at this time.   Past Medical History:  Diagnosis Date   Arthritis    hands   Colon cancer (HCC)    Emphysema of lung (HCC)    mild (per pt)   Hypertension    Numbness    toes and fingers (Keo Schirmer/P chemo - per pt)    Past Surgical History:  Procedure Laterality Date   AMPUTATION Right 2003   4th    CATARACT EXTRACTION W/PHACO Right 02/02/2017   Procedure: CATARACT EXTRACTION PHACO AND INTRAOCULAR LENS PLACEMENT (IOC) RIGHT;  Surgeon: Mittie Gaskin, MD;  Location: [facility];  Service: Ophthalmology;  Laterality: Right;   CATARACT EXTRACTION W/PHACO Left 02/25/2017   Procedure: CATARACT EXTRACTION PHACO AND INTRAOCULAR LENS PLACEMENT (IOC) left;  Surgeon: Mittie Gaskin, MD;  Location: [facility];  Service: Ophthalmology;  Laterality: Left;   COLON SURGERY  2013   COLONOSCOPY     COLONOSCOPY WITH PROPOFOL  N/A 07/16/2018   Procedure: COLONOSCOPY WITH PROPOFOL ;  Surgeon: Gaylyn Gladis PENNER, MD;  Location: [facility] ENDOSCOPY;  Service: Endoscopy;  Laterality: N/A;   ESOPHAGOGASTRODUODENOSCOPY (EGD) WITH PROPOFOL  N/A 07/16/2018   Procedure: ESOPHAGOGASTRODUODENOSCOPY (EGD) WITH PROPOFOL ;  Surgeon: Gaylyn Gladis PENNER, MD;  Location: [facility] ENDOSCOPY;  Service: Endoscopy;  Laterality: N/A;    Social History   Socioeconomic History   Marital status: Divorced    Spouse name: Not on file   Number of children: Not on file   Years of education: Not on file   Highest education level: Not on file  Occupational History   Not on file  Tobacco Use   Smoking status: Every Day    Current packs/day: 0.50    Average  packs/day: 0.5 packs/day for 45.0 years (22.5 ttl pk-yrs)    Types: Cigarettes   Smokeless tobacco: Never   Tobacco comments:    since age 84  Vaping Use   Vaping status: Never Used  Substance and Sexual Activity   Alcohol use: Yes    Alcohol/week: 2.0 standard drinks of alcohol    Types: 2 Cans of beer per week   Drug use: No   Sexual activity: Not on file  Other Topics Concern   Not on file  Social History Narrative   Not on file   Social Drivers of Health   Tobacco Use: High Risk (04/18/2024)   Patient History    Smoking Tobacco Use: Every Day    Smokeless Tobacco Use: Never    Passive Exposure: Not on file  Financial Resource Strain: Not on file  Food Insecurity: Not on file  Transportation Needs: Not on file  Physical Activity: Not on file  Stress: Not on file  Social Connections: Not on file  Intimate Partner Violence: Not on file  Depression (PHQ2-9): Low Risk (03/26/2023)   Depression (PHQ2-9)    PHQ-2 Score: 0  Alcohol Screen: Not on file  Housing: Not on file  Utilities: Not on file  Health Literacy: Not on file    Family History  Problem Relation Age of Onset   Cancer Maternal Grandfather     Allergies[1]  Show/hide medication list[2]  Review of Systems  Constitutional:  Negative for weight loss.  HENT: Negative.    Eyes: Negative.   Respiratory:  Positive for shortness of breath (improved with mdi).   Cardiovascular: Negative.   Gastrointestinal: Negative.   Genitourinary: Negative.   Musculoskeletal:        Muscle cramps  Skin: Negative.   Neurological: Negative.   Endo/Heme/Allergies: Negative.  Does not bruise/bleed easily.       Objective:   BP 122/72   Pulse (!) 105   Ht 6' (1.829 m)   Wt 136 lb 3.2 oz (61.8 kg)   SpO2 95%   BMI 18.47 kg/m   Vitals:   04/18/24 1052  BP: 122/72  Pulse: (!) 105  Height: 6' (1.829 m)  Weight: 136 lb 3.2 oz (61.8 kg)  SpO2: 95%  BMI (Calculated): 18.47    Physical Exam Vitals  reviewed.  Constitutional:      Appearance: Normal appearance.  HENT:     Head: Normocephalic.     Left Ear: There is no impacted cerumen.     Nose: Nose normal.     Mouth/Throat:     Mouth: Mucous membranes are moist.     Pharynx: No posterior oropharyngeal erythema.  Eyes:     Extraocular Movements: Extraocular movements intact.     Pupils: Pupils are equal, round, and reactive to light.  Cardiovascular:     Rate and Rhythm: Regular rhythm.     Chest Wall: PMI is not displaced.     Pulses: Normal pulses.     Heart sounds: Normal heart sounds. No murmur heard. Pulmonary:     Effort: Pulmonary effort is normal.     Breath sounds: Normal air entry. No rhonchi.     Comments: coarse Abdominal:     General: Abdomen is flat. Bowel sounds are normal. There is no distension.     Palpations: Abdomen is soft. There is no hepatomegaly, splenomegaly or mass.     Tenderness: There is no abdominal tenderness.  Musculoskeletal:        General: Normal range of motion.     Cervical back: Normal range of motion and neck supple.     Right lower leg: No edema.     Left lower leg: No edema.  Skin:    General: Skin is warm and dry.  Neurological:     General: No focal deficit present.     Mental Status: He is alert and oriented to person, place, and time.     Cranial Nerves: No cranial nerve deficit.     Motor: No weakness.  Psychiatric:        Mood and Affect: Mood normal.        Behavior: Behavior normal.      No results found for any visits on 04/18/24.  Recent Results (from the past 2160 hours)  BMP8+Anion Gap     Status: Abnormal   Collection Time: 02/08/24 11:43 AM  Result Value Ref Range   Glucose 152 (H) 70 - 99 mg/dL   BUN 10 8 - 27 mg/dL   Creatinine, Ser 8.74 0.76 - 1.27 mg/dL   eGFR 61 >40 fO/fpw/8.26   BUN/Creatinine Ratio 8 (L) 10 - 24   Sodium 133 (L) 134 - 144 mmol/L   Potassium 4.2 3.5 - 5.2 mmol/L   Chloride 96 96 - 106 mmol/L   CO2 21 20 - 29 mmol/L   Anion  Gap 16.0 10.0 - 18.0 mmol/L  Calcium  9.8 8.6 - 10.2 mg/dL  CBC With Diff/Platelet     Status: Abnormal   Collection Time: 02/08/24 11:43 AM  Result Value Ref Range   WBC 4.2 3.4 - 10.8 x10E3/uL   RBC 3.51 (L) 4.14 - 5.80 x10E6/uL   Hemoglobin 11.0 (L) 13.0 - 17.7 g/dL   Hematocrit 65.7 (L) 62.4 - 51.0 %   MCV 97 79 - 97 fL   MCH 31.3 26.6 - 33.0 pg   MCHC 32.2 31.5 - 35.7 g/dL   RDW 87.8 88.3 - 84.5 %   Platelets 176 150 - 450 x10E3/uL   Neutrophils 51 Not Estab. %   Lymphs 37 Not Estab. %   Monocytes 10 Not Estab. %   Eos 1 Not Estab. %   Basos 1 Not Estab. %   Neutrophils Absolute 2.2 1.4 - 7.0 x10E3/uL   Lymphocytes Absolute 1.6 0.7 - 3.1 x10E3/uL   Monocytes Absolute 0.4 0.1 - 0.9 x10E3/uL   EOS (ABSOLUTE) 0.0 0.0 - 0.4 x10E3/uL   Basophils Absolute 0.0 0.0 - 0.2 x10E3/uL   Immature Granulocytes 0 Not Estab. %   Immature Grans (Abs) 0.0 0.0 - 0.1 x10E3/uL      Assessment & Plan:  Kenton was seen today for results.  Primary hypertension -     CBC With Diff/Platelet -     Comprehensive metabolic panel with GFR  Arthralgia, unspecified joint -     Celecoxib ; Take 1 capsule (200 mg total) by mouth daily.  Dispense: 30 capsule; Refill: 1  Simple chronic bronchitis (HCC) -     Combivent  Respimat; Inhale 1 puff into the lungs every 6 (six) hours as needed for wheezing.  Dispense: 4 g; Refill: 2  Gastroesophageal reflux disease, unspecified whether esophagitis present -     Omeprazole ; Take 1 capsule (20 mg total) by mouth daily.  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  Dispense: 30 tablet; Refill: 2  Prostate enlargement -     PSA  Pure hypercholesterolemia -     Lipid panel    Problem List Items Addressed This Visit       Cardiovascular and Mediastinum   Primary hypertension - Primary   Relevant Medications   amLODipine  (NORVASC ) 2.5 MG tablet   Other  Relevant Orders   CBC With Diff/Platelet   Comprehensive metabolic panel with GFR     Respiratory   Simple chronic bronchitis (HCC)   Relevant Medications   Ipratropium-Albuterol (COMBIVENT  RESPIMAT) 20-100 MCG/ACT AERS respimat     Digestive   Gastroesophageal reflux disease   Relevant Medications   omeprazole  (PRILOSEC) 20 MG capsule     Other   Prostate enlargement   Relevant Orders   PSA   Arthralgia   Relevant Medications   celecoxib  (CELEBREX ) 200 MG capsule   Pure hypercholesterolemia   Relevant Medications   amLODipine  (NORVASC ) 2.5 MG tablet   Other Relevant Orders   Lipid panel   Other Visit Diagnoses       Raynaud'Parisa Pinela phenomenon without gangrene       Relevant Medications   amLODipine  (NORVASC ) 2.5 MG tablet       Return in about 3 months (around 07/17/2024) for awv with labs prior.   Total time spent: 30 minutes. This time includes review of previous notes and results and patient face to face interaction during today'Anecia Nusbaum visit.    Sherrill Cinderella Perry,  MD  04/18/2024   This document may have been prepared by [facility] Voice Recognition software and as such may include unintentional dictation errors.     [1] No Known Allergies [2]  Outpatient Medications Prior to Visit  Medication Sig   cetirizine  (ZYRTEC ) 10 MG tablet TAKE 1 TABLET BY MOUTH EVERY MORNING   fluticasone  (FLONASE ) 50 MCG/ACT nasal spray Place 1 spray into both nostrils 2 (two) times daily.   Fluticasone -Umeclidin-Vilant (TRELEGY ELLIPTA ) 100-62.5-25 MCG/ACT AEPB INHALE 1 PUFF INTO THE LUNGS DAILY AS DIRECTED.   gabapentin  (NEURONTIN ) 300 MG capsule Take 1 capsule (300 mg total) by mouth 3 (three) times daily.   olmesartan  (BENICAR ) 20 MG tablet TAKE 1 TABLET BY MOUTH DAILY   rosuvastatin  (CRESTOR ) 5 MG tablet Take 1 tablet (5 mg total) by mouth daily.   sildenafil (VIAGRA) 100 MG tablet Take 100 mg by mouth daily as needed for erectile dysfunction.   [DISCONTINUED] celecoxib  (CELEBREX )  200 MG capsule TAKE 1 CAPSULE BY MOUTH ONCE DAILY   [DISCONTINUED] Ipratropium-Albuterol (COMBIVENT  RESPIMAT) 20-100 MCG/ACT AERS respimat Inhale 1 puff into the lungs every 6 (six) hours as needed for wheezing.   [DISCONTINUED] omeprazole  (PRILOSEC) 20 MG capsule TAKE 1 CAPSULE BY MOUTH ONCE DAILY   Facility-Administered Medications Prior to Visit  Medication Dose Route Frequency Provider   heparin  lock flush 100 unit/mL  500 Units Intravenous Once Corcoran, Melissa C, MD   sodium chloride  0.9 % injection 10 mL  10 mL Intravenous PRN Corcoran, Melissa C, MD   sodium chloride  flush (NS) 0.9 % injection 10 mL  10 mL Intravenous PRN Corcoran, Melissa C, MD   sodium chloride  flush (NS) 0.9 % injection 10 mL  10 mL Intravenous PRN Corcoran, Melissa C, MD   sodium chloride  flush (NS) 0.9 % injection 10 mL  10 mL Intravenous PRN Corcoran, Melissa C, MD   "

## 2024-04-19 LAB — CBC WITH DIFF/PLATELET
Basophils Absolute: 0.1 x10E3/uL (ref 0.0–0.2)
Basos: 1 %
EOS (ABSOLUTE): 0 x10E3/uL (ref 0.0–0.4)
Eos: 0 %
Hematocrit: 47.1 % (ref 37.5–51.0)
Hemoglobin: 16.1 g/dL (ref 13.0–17.7)
Immature Grans (Abs): 0 x10E3/uL (ref 0.0–0.1)
Immature Granulocytes: 0 %
Lymphocytes Absolute: 1.8 x10E3/uL (ref 0.7–3.1)
Lymphs: 30 %
MCH: 33.6 pg — ABNORMAL HIGH (ref 26.6–33.0)
MCHC: 34.2 g/dL (ref 31.5–35.7)
MCV: 98 fL — ABNORMAL HIGH (ref 79–97)
Monocytes Absolute: 0.6 x10E3/uL (ref 0.1–0.9)
Monocytes: 10 %
Neutrophils Absolute: 3.5 x10E3/uL (ref 1.4–7.0)
Neutrophils: 59 %
Platelets: 160 x10E3/uL (ref 150–450)
RBC: 4.79 x10E6/uL (ref 4.14–5.80)
RDW: 12.5 % (ref 11.6–15.4)
WBC: 6 x10E3/uL (ref 3.4–10.8)

## 2024-07-19 ENCOUNTER — Ambulatory Visit: Admitting: Internal Medicine
